# Patient Record
Sex: Female | Born: 1935
Health system: Southern US, Community
[De-identification: ages and names within clinical notes are randomized; demographics above are authoritative.]

## PROBLEM LIST (undated history)

## (undated) DIAGNOSIS — F419 Anxiety disorder, unspecified: Secondary | ICD-10-CM

## (undated) DIAGNOSIS — I639 Cerebral infarction, unspecified: Secondary | ICD-10-CM

## (undated) DIAGNOSIS — C50919 Malignant neoplasm of unspecified site of unspecified female breast: Secondary | ICD-10-CM

## (undated) DIAGNOSIS — I10 Essential (primary) hypertension: Secondary | ICD-10-CM

## (undated) DIAGNOSIS — I6529 Occlusion and stenosis of unspecified carotid artery: Secondary | ICD-10-CM

## (undated) DIAGNOSIS — I82409 Acute embolism and thrombosis of unspecified deep veins of unspecified lower extremity: Secondary | ICD-10-CM

## (undated) DIAGNOSIS — E785 Hyperlipidemia, unspecified: Secondary | ICD-10-CM

## (undated) DIAGNOSIS — I454 Nonspecific intraventricular block: Secondary | ICD-10-CM

## (undated) DIAGNOSIS — I5030 Unspecified diastolic (congestive) heart failure: Secondary | ICD-10-CM

## (undated) DIAGNOSIS — R413 Other amnesia: Secondary | ICD-10-CM

## (undated) DIAGNOSIS — Z90710 Acquired absence of both cervix and uterus: Secondary | ICD-10-CM

## (undated) DIAGNOSIS — E039 Hypothyroidism, unspecified: Secondary | ICD-10-CM

## (undated) HISTORY — DX: Cerebral infarction, unspecified: I63.9

## (undated) HISTORY — PX: PACEMAKER INSERTION: SHX728

## (undated) HISTORY — DX: Anxiety disorder, unspecified: F41.9

## (undated) HISTORY — DX: Hyperlipidemia, unspecified: E78.5

## (undated) HISTORY — DX: Hypothyroidism, unspecified: E03.9

## (undated) HISTORY — DX: Occlusion and stenosis of unspecified carotid artery: I65.29

## (undated) HISTORY — PX: CARPAL TUNNEL RELEASE: SHX101

## (undated) HISTORY — PX: BREAST SURGERY: SHX581

## (undated) HISTORY — DX: Nonspecific intraventricular block: I45.4

## (undated) HISTORY — DX: Unspecified diastolic (congestive) heart failure: I50.30

## (undated) HISTORY — DX: Other amnesia: R41.3

## (undated) HISTORY — DX: Acquired absence of both cervix and uterus: Z90.710

## (undated) HISTORY — DX: Acute embolism and thrombosis of unspecified deep veins of unspecified lower extremity: I82.409

## (undated) HISTORY — PX: CAROTID ENDARTERECTOMY: SUR193

## (undated) HISTORY — DX: Essential (primary) hypertension: I10

## (undated) HISTORY — PX: ABDOMINAL HYSTERECTOMY: SHX81

## (undated) HISTORY — PX: OTHER SURGICAL HISTORY: SHX169

## (undated) HISTORY — DX: Malignant neoplasm of unspecified site of unspecified female breast: C50.919

## (undated) HISTORY — PX: MASTECTOMY: SHX3

---

## 1998-11-02 ENCOUNTER — Ambulatory Visit (HOSPITAL_COMMUNITY): Admission: RE | Admit: 1998-11-02 | Discharge: 1998-11-02 | Payer: Self-pay | Admitting: Specialist

## 1999-05-25 ENCOUNTER — Encounter (INDEPENDENT_AMBULATORY_CARE_PROVIDER_SITE_OTHER): Payer: Self-pay | Admitting: Specialist

## 1999-05-25 ENCOUNTER — Observation Stay (HOSPITAL_COMMUNITY): Admission: RE | Admit: 1999-05-25 | Discharge: 1999-05-26 | Payer: Self-pay | Admitting: *Deleted

## 1999-05-28 ENCOUNTER — Emergency Department (HOSPITAL_COMMUNITY): Admission: EM | Admit: 1999-05-28 | Discharge: 1999-05-29 | Payer: Self-pay | Admitting: Emergency Medicine

## 1999-06-01 ENCOUNTER — Emergency Department (HOSPITAL_COMMUNITY): Admission: EM | Admit: 1999-06-01 | Discharge: 1999-06-01 | Payer: Self-pay | Admitting: Internal Medicine

## 2000-08-10 ENCOUNTER — Encounter: Payer: Self-pay | Admitting: Orthopedic Surgery

## 2000-08-17 ENCOUNTER — Ambulatory Visit (HOSPITAL_COMMUNITY): Admission: RE | Admit: 2000-08-17 | Discharge: 2000-08-17 | Payer: Self-pay | Admitting: Orthopedic Surgery

## 2000-09-25 ENCOUNTER — Ambulatory Visit (HOSPITAL_COMMUNITY): Admission: RE | Admit: 2000-09-25 | Discharge: 2000-09-25 | Payer: Self-pay | Admitting: Orthopedic Surgery

## 2003-05-06 ENCOUNTER — Encounter: Admission: RE | Admit: 2003-05-06 | Discharge: 2003-06-11 | Payer: Self-pay | Admitting: *Deleted

## 2005-11-07 HISTORY — PX: CARDIAC CATHETERIZATION: SHX172

## 2006-01-20 ENCOUNTER — Ambulatory Visit (HOSPITAL_COMMUNITY): Admission: RE | Admit: 2006-01-20 | Discharge: 2006-01-21 | Payer: Self-pay | Admitting: Cardiology

## 2006-01-26 ENCOUNTER — Ambulatory Visit (HOSPITAL_COMMUNITY): Admission: RE | Admit: 2006-01-26 | Discharge: 2006-01-26 | Payer: Self-pay | Admitting: Family Medicine

## 2006-01-26 ENCOUNTER — Encounter (INDEPENDENT_AMBULATORY_CARE_PROVIDER_SITE_OTHER): Payer: Self-pay | Admitting: *Deleted

## 2007-12-18 ENCOUNTER — Ambulatory Visit: Payer: Self-pay | Admitting: *Deleted

## 2007-12-18 ENCOUNTER — Encounter: Payer: Self-pay | Admitting: Cardiology

## 2009-06-01 ENCOUNTER — Inpatient Hospital Stay (HOSPITAL_COMMUNITY): Admission: EM | Admit: 2009-06-01 | Discharge: 2009-06-05 | Payer: Self-pay | Admitting: Emergency Medicine

## 2009-06-02 ENCOUNTER — Encounter (INDEPENDENT_AMBULATORY_CARE_PROVIDER_SITE_OTHER): Payer: Self-pay | Admitting: Internal Medicine

## 2009-12-25 ENCOUNTER — Ambulatory Visit: Payer: Self-pay | Admitting: Vascular Surgery

## 2010-02-04 ENCOUNTER — Encounter: Admission: RE | Admit: 2010-02-04 | Discharge: 2010-02-04 | Payer: Self-pay | Admitting: Cardiology

## 2010-07-13 ENCOUNTER — Ambulatory Visit: Payer: Self-pay | Admitting: Cardiology

## 2010-07-24 ENCOUNTER — Encounter: Payer: Self-pay | Admitting: Internal Medicine

## 2010-07-28 ENCOUNTER — Ambulatory Visit: Payer: Self-pay | Admitting: Cardiology

## 2010-10-28 ENCOUNTER — Ambulatory Visit: Payer: Self-pay | Admitting: Internal Medicine

## 2010-11-16 ENCOUNTER — Encounter (INDEPENDENT_AMBULATORY_CARE_PROVIDER_SITE_OTHER): Payer: Self-pay | Admitting: *Deleted

## 2010-12-07 NOTE — Miscellaneous (Signed)
Summary: Device preload  Clinical Lists Changes  Observations: Added new observation of PPM INDICATN: CHB (07/24/2010 11:18) Added new observation of MAGNET RTE: BOL 85 ERI 65 (07/24/2010 11:18) Added new observation of PPMLEADSTAT2: active (07/24/2010 11:18) Added new observation of PPMLEADSER2: 161096  (07/24/2010 11:18) Added new observation of PPMLEADMOD2: 4470  (07/24/2010 11:18) Added new observation of PPMLEADDOI2: 01/20/2006  (07/24/2010 11:18) Added new observation of PPMLEADLOC2: RV  (07/24/2010 11:18) Added new observation of PPMLEADSTAT1: active  (07/24/2010 11:18) Added new observation of PPMLEADSER1: 045409  (07/24/2010 11:18) Added new observation of PPMLEADMOD1: 4469  (07/24/2010 11:18) Added new observation of PPMLEADDOI1: 01/20/2006  (07/24/2010 11:18) Added new observation of PPMLEADLOC1: RA  (07/24/2010 11:18) Added new observation of PPM IMP MD: Duffy Rhody Tennant,MD  (07/24/2010 11:18) Added new observation of PPM DOI: 01/20/2006  (07/24/2010 11:18) Added new observation of PPM SERL#: [id number]  (07/24/2010 11:18) Added new observation of PPM MODL#: [id number]  (07/24/2010 95:62) Added new observation of PACEMAKERMFG: [employer]tronic  (07/24/2010 11:18) Added new observation of PPM REFER MD: Duffy Rhody Tennant,MD  (07/24/2010 11:18) Added new observation of PACEMAKER MD: Lewayne Bunting, MD  (07/24/2010 11:18)      PPM Specifications Following MD:  Lewayne Bunting, MD     Referring MD:  Rolla Plate PPM Vendor:  Medtronic     PPM Model Number:  [id number]     PPM Serial Number:  [id number] PPM DOI:  01/20/2006     PPM Implanting MD:  Rolla Plate  Lead 1    Location: RA     DOI: 01/20/2006     Model #: [id number]     Serial #: [id number]     Status: active Lead 2    Location: RV     DOI: 01/20/2006     Model #: [id number]     Serial #: [id number]     Status: active  Magnet Response Rate:  BOL 85 ERI 65  Indications:  CHB

## 2010-12-09 NOTE — Letter (Signed)
Summary: Remote Device Check  [employer], Main Office  [address]   [place], [place] [address]   Phone: [phone]  Fax: [phone]     November 16, 2010 MRN: [id number]   [facility] [address] [place], [place]  [address]   Dear Ms. Defenbaugh,   Your remote transmission was recieved and reviewed by your physician.  All diagnostics were within normal limits for you.  __X____Your next office visit is scheduled for:   01-25-11 @ 1110 am with Dr Ladona Ridgel.    Sincerely,  Vella Kohler

## 2010-12-09 NOTE — Cardiovascular Report (Signed)
Summary: Office Visit Remote   Office Visit Remote   Imported By: Roderic Ovens 11/26/2010 10:44:16  _____________________________________________________________________  External Attachment:    Type:   Image     Comment:   External Document

## 2011-01-11 ENCOUNTER — Ambulatory Visit: Payer: Self-pay | Admitting: Cardiology

## 2011-01-25 ENCOUNTER — Ambulatory Visit (INDEPENDENT_AMBULATORY_CARE_PROVIDER_SITE_OTHER): Payer: Medicare Other | Admitting: Internal Medicine

## 2011-01-25 ENCOUNTER — Encounter: Payer: Self-pay | Admitting: Internal Medicine

## 2011-01-25 DIAGNOSIS — Z950 Presence of cardiac pacemaker: Secondary | ICD-10-CM | POA: Insufficient documentation

## 2011-01-25 DIAGNOSIS — I442 Atrioventricular block, complete: Secondary | ICD-10-CM

## 2011-01-25 DIAGNOSIS — Z8673 Personal history of transient ischemic attack (TIA), and cerebral infarction without residual deficits: Secondary | ICD-10-CM

## 2011-01-25 DIAGNOSIS — I10 Essential (primary) hypertension: Secondary | ICD-10-CM

## 2011-01-25 NOTE — Assessment & Plan Note (Signed)
Her blood pressure is well controlled. She will continue her current meds and maintain a low sodium diet. 

## 2011-01-25 NOTE — Assessment & Plan Note (Signed)
Her device is working normally. Will recheck in several months. 

## 2011-01-25 NOTE — Assessment & Plan Note (Signed)
She has not had any additional symptoms. Will follow.

## 2011-01-25 NOTE — Progress Notes (Signed)
HPI Erin [name] is referred today for PPM followup. She is a pleasant 77 yo woman with a h/o HTN, symptomatic bradycardia, s/p PPM, remote tobacco abuse and peripheral edema for which she has been on TED hose. She denies c/p, sob, or syncope. No recent TIA symptoms.    Allergies  Allergen Reactions  . Toprol Xl (Metoprolol Succinate)      Current Outpatient Prescriptions  Medication Sig Dispense Refill  . aspirin 325 MG tablet Take 325 mg by mouth daily.        . Calcium Carb-Cholecalciferol (CALCIUM 1000 + D PO) Take by mouth.        . calcium carbonate (TUMS - DOSED IN MG ELEMENTAL CALCIUM) 500 MG chewable tablet Chew 2 tablets by mouth daily.        . chlordiazePOXIDE (LIBRIUM) 10 MG capsule Take 10 mg by mouth daily.        . Coenzyme Q10 (CO Q 10 PO) Take by mouth daily.        Marland Kitchen CRANBERRY FRUIT PO Take by mouth daily. 4 tablets daily       . Fish Oil OIL by Does not apply route daily.        . furosemide (LASIX) 20 MG tablet Take 20 mg by mouth daily.        Chilton Si Tea, Camillia sinensis, 250 MG CAPS Take by mouth daily. 4 tablets daily       . levothyroxine (SYNTHROID) 75 MCG tablet Take 75 mcg by mouth daily.        . Misc Natural Products (OSTEO BI-FLEX JOINT SHIELD PO) Take by mouth daily.        . Multiple Vitamin (MULTIVITAMIN PO) Take by mouth daily.        . simvastatin (ZOCOR) 40 MG tablet Take 40 mg by mouth at bedtime.           No past medical history on file.  ROS:   All systems reviewed and negative except as noted in the HPI.   No past surgical history on file.   No family history on file.   History   Social History  . Marital Status: Widowed    Spouse Name: N/A    Number of Children: N/A  . Years of Education: N/A   Occupational History  . Not on file.   Social History Main Topics  . Smoking status: Current Everyday Smoker -- 1.0 packs/day    Types: Cigarettes  . Smokeless tobacco: Former Neurosurgeon    Quit date: 11/07/1978  . Alcohol Use: No    . Drug Use: No  . Sexually Active: Not on file   Other Topics Concern  . Not on file   Social History Narrative  . No narrative on file     BP 130/64  Pulse 67  Ht 4\' 9"  (1.448 m)  Wt 96 lb 1.9 oz (43.6 kg)  BMI 20.80 kg/m2  Physical Exam:  Well appearing elderly woman,  NAD HEENT: Unremarkable Neck:  No JVD, no thyromegally Lymphatics:  No adenopathy Back:  No CVA tenderness Lungs:  Clear. Well healed PPM incision. HEART:  Regular rate rhythm, no murmurs, no rubs, no clicks Abd:  Flat, positive bowel sounds, no organomegally, no rebound, no guarding Ext:  2 plus pulses, no edema, no cyanosis, no clubbing Skin:  No rashes no nodules Neuro:  CN II through XII intact, motor grossly intact  DEVICE  Normal device function.  See PaceArt for details.  Assess/Plan:

## 2011-01-25 NOTE — Patient Instructions (Signed)
Your physician wants you to follow-up in 12 months. You will receive a reminder letter in the mail two months in advance. If you don't receive a letter, please call our office to schedule the follow-up appointment. Your physician recommends that you continue on your current medications as directed. Please refer to the Current Medication list given to you today.  Remote monitoring is used to monitor your Pacemaker or ICD from home. This monitoring reduces the number of office visits required to check your device to one time per year. It allows us to keep an eye on the functioning of your device to ensure it is working properly. You are scheduled for a device check from home on 04/28/11. You may send your transmission at any time that day. If you have a wireless device, the transmission will be sent automatically. After your physician reviews your transmission, you will receive a postcard with your next transmission date.  

## 2011-02-13 LAB — PROTIME-INR
INR: 0.9 (ref 0.00–1.49)
Prothrombin Time: 12.3 seconds (ref 11.6–15.2)

## 2011-02-13 LAB — CBC
MCV: 92.3 fL (ref 78.0–100.0)
Platelets: 269 10*3/uL (ref 150–400)
WBC: 4.3 10*3/uL (ref 4.0–10.5)

## 2011-02-13 LAB — POCT I-STAT, CHEM 8
Calcium, Ion: 1.07 mmol/L — ABNORMAL LOW (ref 1.12–1.32)
HCT: 42 % (ref 36.0–46.0)
Hemoglobin: 14.3 g/dL (ref 12.0–15.0)
TCO2: 25 mmol/L (ref 0–100)

## 2011-02-13 LAB — COMPREHENSIVE METABOLIC PANEL
CO2: 27 mEq/L (ref 19–32)
Calcium: 8.9 mg/dL (ref 8.4–10.5)
Creatinine, Ser: 0.59 mg/dL (ref 0.4–1.2)
GFR calc non Af Amer: >60 mL/min (ref >60)
Glucose, Bld: 104 mg/dL — ABNORMAL HIGH (ref 70–99)

## 2011-02-13 LAB — BASIC METABOLIC PANEL
Chloride: 100 mEq/L (ref 96–112)
Creatinine, Ser: 0.74 mg/dL (ref 0.4–1.2)
GFR calc Af Amer: >60 mL/min (ref >60)
GFR calc non Af Amer: >60 mL/min (ref >60)
Potassium: 3.6 mEq/L (ref 3.5–5.1)

## 2011-02-13 LAB — LIPID PANEL
Total CHOL/HDL Ratio: 4.5 RATIO
Triglycerides: 66 mg/dL (ref <150)
VLDL: 13 mg/dL (ref 0–40)

## 2011-02-13 LAB — DIFFERENTIAL
Basophils Relative: 1 % (ref 0–1)
Eosinophils Absolute: 0.2 10*3/uL (ref 0.0–0.7)
Lymphs Abs: 1.8 10*3/uL (ref 0.7–4.0)
Neutrophils Relative %: 41 % — ABNORMAL LOW (ref 43–77)

## 2011-02-13 LAB — GLUCOSE, CAPILLARY: Glucose-Capillary: 107 mg/dL — ABNORMAL HIGH (ref 70–99)

## 2011-02-13 LAB — HEMOGLOBIN A1C: Hgb A1c MFr Bld: 5.7 % (ref 4.6–6.1)

## 2011-02-13 LAB — CK TOTAL AND CKMB (NOT AT ARMC): Relative Index: INVALID (ref 0.0–2.5)

## 2011-02-13 LAB — APTT: aPTT: 25 seconds (ref 24–37)

## 2011-02-13 LAB — HOMOCYSTEINE: Homocysteine: 5.4 umol/L (ref 4.0–15.4)

## 2011-02-13 LAB — TROPONIN I: Troponin I: 0.01 ng/mL (ref 0.00–0.06)

## 2011-03-01 ENCOUNTER — Other Ambulatory Visit: Payer: Self-pay | Admitting: *Deleted

## 2011-03-01 DIAGNOSIS — R609 Edema, unspecified: Secondary | ICD-10-CM

## 2011-03-01 MED ORDER — FUROSEMIDE 20 MG PO TABS: 20.0000 mg | ORAL_TABLET | Freq: Every day | ORAL | Status: DC

## 2011-03-18 ENCOUNTER — Encounter: Payer: Self-pay | Admitting: *Deleted

## 2011-03-22 NOTE — H&P (Signed)
NAMESHAGUN, WORDELL              ACCOUNT NO.:  [id number]   MEDICAL RECORD NO.:  [id number]          PATIENT TYPE:  EMS   LOCATION:  MAJO                         FACILITY:  [facility]   PHYSICIAN:  Hollice Espy, M.D.DATE OF BIRTH:  02-Feb-1920   DATE OF ADMISSION:  06/01/2009  DATE OF DISCHARGE:                              HISTORY & PHYSICAL   PRIMARY CARE PHYSICIAN:  Vikki Ports, M.D.   CARDIOLOGIST:  Colleen Can. Deborah Chalk, M.D.   CHIEF COMPLAINT:  Left arm weakness.   HISTORY OF PRESENT ILLNESS:  The patient is a 77 year old white female  with past medical history of hypertension, hyperlipidemia and history of  bundle branch block, status post pacemaker, who today is still having  problems with left arm heaviness and weakness and unsteady gait.  She  said when she tried to stand, she was unsteady on her feet.  She denied  any vertigo-like symptoms, and she also said that her left arm felt very  heavy and clumsy.  She came into the emergency room for further  evaluation.  In the emergency room, she had a CT scan of her head done  which showed no evidence of any acute infarct or hemorrhage.  She had,  based on previous studies, some mild plaque in her bilateral internal  carotid arteries, based on an old study but no new studies have been  done in the last 3 years.  The rest of her labs were essentially  unremarkable, noting a paced rhythm on her EKG and blood work, otherwise  looking stable.  Patient was then brought in for a further evaluation of  a possible stroke workup.   When I saw the patient, she was doing well.  She is still complaining of  some left arm heaviness.  She also complained that when she tries to  stand up and walk, she gets very unsteady.  She also complained of a  minimal headache, no vision changes, no dysphagia, no chest pain,  palpitations, shortness of breath, wheeze, cough.  No abdominal pain.  No hematuria, dysuria, constipation, diarrhea.  Her  review of systems  otherwise than described above is otherwise negative.   PAST MEDICAL HISTORY:  Includes a history of some carotid artery plaque  but no significant stenosis,  history of full bundle branch block,  status post pacer, hypertension, hypothyroidism, breast cancer, status  post mastectomy, cataracts, hyperlipidemia, anxiety, DVT and some  diastolic CHF.   MEDICATIONS:  The patient is on Norvasc 2.5, aspirin 81, Librium 10,  Synthroid 88 mcg, multivitamin and Metamucil.   She has allergies to LISINOPRIL as well as to METOPROLOL, unclear with  kind of allergy this is.   She denies any tobacco, alcohol or drug use.   FAMILY HISTORY:  Noncontributory.   PHYSICAL EXAMINATION:  VITALS ON ADMISSION:  Temperature 97, blood  pressure 162/57, respirations 18, heart rate 64, O2 sat 93% on room air.  GENERAL:  She is alert and x3 in no apparent distress.  HEENT:  Normocephalic atraumatic.  Mucous membranes are moist.  She had  no carotid bruits.  HEART:  Regular rate and rhythm, paced rhythm.  LUNGS:  Clear to auscultation bilaterally.  ABDOMEN:  Soft, nontender with positive bowel sounds.  EXTREMITIES:  No clubbing, cyanosis or edema.  NEUROLOGIC:  Cranial nerves II-XII are intact.  Her actual strength when  distracted, her grip strength appears to be equal. She had some fine  finger-to-nose dysmetria on her left upper extremity, which is new, but  her lower extremities appear to be equal strength and no evidence of any  Babinski.   LAB WORK:  INR 0.9.  Sodium 131, potassium 2.9, chloride 95, bicarb 25,  BUN 4, creatinine 0.8, glucose 101, calcium is 1.07.  White count 4.3, H  and H 14 and 42.  MCV is normal.  Platelet count 269, INR 0.9.   EKG again shows a paced rhythm.   Head CT shows notes any acute infarct or hemorrhage.   ASSESSMENT/PLAN:  1. Left arm weakness with unsteady gait:  It does not look like she      has orthostatic hypotension.  No signs of an  infection.  This      possibly could be a cerebrovascular accident.  Will check Dopplers      and an echo and other baseline labs.  We are not able to check an      MRI because of her pacemaker.  May need to check if her symptoms      persist a follow-up CT in 72 hours.  2. History of bundle branch block, status post pacemaker, currently      stable.  3. History of congestive heart failure:  Currently does not look to be      in any kind of volume overload at all.  4. Hypothyroidism:  Continue Synthroid.  Will go ahead and check a TSH      on this patient.  Hollice Espy, M.D.  Electronically Signed     SKK/MEDQ  D:  06/01/2009  T:  06/01/2009  Job:  [id number]   cc:   Vikki Ports, M.D.  Colleen Can. Deborah Chalk, M.D.

## 2011-03-22 NOTE — Procedures (Signed)
CAROTID DUPLEX EXAM   INDICATION:  Syncope.  Patient having 3-4 syncopal episodes weekly for  the past month.  Patient noticed onset after changing medications.   HISTORY:  Diabetes:  Borderline.  Cardiac:  Pacemaker in 03/07.  Hypertension:  Yes.  Smoking:  No.  Previous Surgery:  Cardiac.  CV History:  Amaurosis Fugax No, Paresthesias No, Hemiparesis No                                       RIGHT             LEFT  Brachial systolic pressure:         124               128  Brachial Doppler waveforms:         WNL               WNL  Vertebral direction of flow:        Antegrade         Antegrade  DUPLEX VELOCITIES (cm/sec)  CCA peak systolic                   62                78  ECA peak systolic                   117               93  ICA peak systolic                   52                70  ICA end diastolic                   12                11  PLAQUE MORPHOLOGY:                  Mixed, calcific   Calcific with  shadowing  PLAQUE AMOUNT:                      Mild              Mild-moderate  PLAQUE LOCATION:                    ICA               Bifurcation, ICA   IMPRESSION:  1. Bilateral 1-39% internal carotid artery stenoses.  2. Patent external carotid arteries.  3. Bilateral antegrade flow in vertebral arteries.   ___________________________________________  P. Liliane Bade, M.D.   PB/MEDQ  D:  12/18/2007  T:  12/19/2007  Job:  [id number]

## 2011-03-22 NOTE — Procedures (Signed)
DUPLEX DEEP VENOUS EXAM - LOWER EXTREMITY   INDICATION:  Edema, redness to left leg.   HISTORY:  Edema:  Yes.  Trauma/Surgery:  No.  Pain:  Yes.  PE:  No.  Previous DVT:  No.  Anticoagulants:  No.  Other:  No.   DUPLEX EXAM:                CFV   SFV   PopV  PTV         GSV                R  L  R  L  R  L  R   L       R  L  Thrombosis    0  0     0     0      Not visualized          0  Spontaneous   +  +     +     +      Not visualized          +  Phasic        +  +     +     +      Not visualized          +  Augmentation  +  +     +     +      Not visualized          +  Compressible  +  +     +     +      Not visualized          +  Competent     +  +     +     +      Not visualized          +   Legend:  + - yes  o - no  p - partial  D - decreased   IMPRESSION:  There does not appear to be any deep vein thrombus noted in  the left leg.  The left posterior tibial veins cannot be visualized due  to edema.     _____________________________  Larina Earthly, M.D.   CB/MEDQ  D:  12/25/2009  T:  12/26/2009  Job:  [id number]

## 2011-03-22 NOTE — Consult Note (Signed)
Erin [name], Erin [name]              ACCOUNT NO.:  [id number]   MEDICAL RECORD NO.:  [id number]          PATIENT TYPE:  INP   LOCATION:  3022                         FACILITY:  [facility]   PHYSICIAN:  Casimiro Needle L. Reynolds, M.D.DATE OF BIRTH:  [birth year]   DATE OF CONSULTATION:  06/01/2009  DATE OF DISCHARGE:                                 CONSULTATION   REQUESTING PHYSICIAN:  [facility].   REASON FOR EVALUATION:  Left-sided weakness.   HISTORY OF PRESENT ILLNESS:  This is the initial inpatient consultation  evaluation of this 77 year old woman with a past medical history which  includes hypertension and hyperlipidemia and pacemaker placement.  The  patient says that she awoke this morning feeling well, but then noticed  around 12:30 that she had some weakness in the left side, and some  difficulty walking around.  She was brought to the emergency department,  where she is noted to have weakness on the left side.  Initially, Code  Stroke was called, and her symptoms improved considerably, and Code  Stroke was cancelled.  However, her symptoms did not resolve fully, and  have continued with some little bit of waxing and waning course.  She  denies any associated headache, visual symptoms, dysarthria, dysphasia.  She denies any previous history of stroke symptoms.   PAST HISTORY:  No previous history of stroke.  She has hypertension and  high cholesterol, and is on medications for hypertension.  She also had  hypothyroidism on replacement.  She has remote history of breast cancer.  She has anxiety, on Librium.   MEDICATIONS:  1. Amlodipine 2.5 mg daily.  2. Librium 10 mg daily.  3. Levothyroxine 88 mcg daily.  4. Aspirin 81 mg daily.  5. Multivitamin.  6. Metamucil.   FAMILY/SOCIAL/REVIEW OF SYSTEMS:  Per admission H and P which is  reviewed.   PHYSICAL EXAMINATION:  VITAL SIGNS:  Temperature 97, blood pressure  162/57, pulse 64, respirations 18.  GENERAL:   This is a healthy-appearing woman supine in the hospital bed  in no distress.  HEENT:  Head, Cranium is normocephalic and atraumatic.  Oropharynx  benign.  NECK:  Supple without carotid or supraclavicular bruits.  HEART:  Regular rate and rhythm without murmurs.  NEUROLOGIC:  Mental status:  She is awake and alert.  She is fully  oriented to time, place, and person.  Speech is fluent and not  dysarthric.  Recent and remote memory are intact.  Attention span,  concentration, and fund of knowledge are all appropriate.  Cranial  nerves:  Pupils are equal and briskly reactive.  Extraocular movements  are full without nystagmus.  Visual fields are full to confrontation.  Hearing is intact to conversational speech.  Facial sensation is intact  to pinprick.  Face, tongue, and palate move normally and symmetrically.  Motor:  Normal bulk and tone.  Normal strength in all tested extremity  muscles.  Sensation:  Diminished pinprick sensation in the left arm,  otherwise intact to pinprick in all extremities.  Coordination:  Finger-  nose and heel-shin are performed well in the right, but  there is notable  ataxia on the left.  Reflexes are 2+ and symmetric.  Toes is up on the  left, down on the right.  Gait is deferred.   LABORATORY REVIEW:  Labs per daily notes.  CT of the head is personally  reviewed, and the study demonstrates atrophy and white matter disease  without acute finding.   IMPRESSION:  Acute left-sided hemisensory changes and hemiataxia, likely  due to a small subcortical right brain stroke.   RECOMMENDATIONS:  She needs stroke workup.  Because of her pacer, MRI is  contraindicated, so she will need a followup CT of the head along with  CT angiogram tomorrow. She will also need Doppler studies and stroke  labs, as well as a 2-D echo.  The Physical [facility] Occupational Therapy and  Rehab should be consulted.  Stroke Service to follow.      Michael L. Thad Ranger, M.D.  Electronically  Signed     Marolyn Hammock. Thad Ranger, M.D.  Electronically Signed    MLR/MEDQ  D:  06/01/2009  T:  06/02/2009  Job:  [id number]   cc:   Colleen Can. Deborah Chalk, M.D.

## 2011-03-22 NOTE — Discharge Summary (Signed)
Erin [name], [name]              ACCOUNT NO.:  [id number]   MEDICAL RECORD NO.:  [id number]          PATIENT TYPE:  INP   LOCATION:  3022                         FACILITY:  [facility]   PHYSICIAN:  Ramiro Harvest, MD    DATE OF BIRTH:  08-18-1920   DATE OF ADMISSION:  06/01/2009  DATE OF DISCHARGE:  06/05/2009                               DISCHARGE SUMMARY   DIAGNOSIS AT TIME OF DISCHARGE:  1. Right-sided CVA (cerebrovascular accident).  2. History of bundle branch block status post permanent pacemaker.  3. Hypertension currently on increased dose of Norvasc.  4. Hypothyroidism.  5. Dyslipidemia.   HISTORY OF PRESENT ILLNESS:  Erin [name] is an 77 year old white female  who was admitted on June 01, 2009.  Her chief complaint at time of  admission was left arm weakness.  Her past medical history was  significant for hypertension, hyperlipidemia and history of bundle  branch block which has required permanent pacemaker placement.  On the  day of admission she was still having problems with left arm heaviness  and weakness as well as unsteady gait.  She felt as though her left arm  was very heavy and clumsy.  We were asked to admit the patient for  further evaluation and treatment for a stroke workup.   COURSE OF HOSPITALIZATION:  Right-sided CVA.  The patient came to the  [facility] emergency department where she underwent a CT of the head  without contrast.  This showed no CT evidence of acute infarction.  It  also noted no evidence of acute intracranial hemorrhage.  Due to the  patient's permanent pacemaker she was unable to undergo MRI.  A  neurology consult was requested and the patient was seen in consultation  by Dr. Kelli Hope.  It was recommended that the patient undergo CT  angio of the head and neck.  CT angio noted narrowing of the proximal  left subclavian artery estimated at 40-50% and this was felt to be a  contributing factor in the patient's CVA.   Recommendations are at this  time risk factor modification.  We will continue to treat the patient's  lipids.  Her aspirin has been increased from 81-325 mg p.o. daily.  She  continues with left-sided hemiparesis and will be transferred this  afternoon to the skilled nursing facility where she will undergo further  rehabilitation prior to discharge to home.   MEDICATIONS AT TIME OF DISCHARGE:  1. Amlodipine 5 mg p.o. daily.  This is a new dose for the patient.  2. Librium 10 mg p.o. daily.  3. Synthroid 88 mcg p.o. daily.  4. Aspirin 325 mg p.o. daily.  This is a new dose for the patient      since prior to admission.  5. Multivitamin 1 tablet p.o. daily.  6. Metamucil 1 packet p.o. daily.   PHYSICAL EXAM:  VITAL SIGNS:  BP 131/59, heart rate 63, respiratory rate  20, temperature 98.5, O2 sat 97% on room air.  GENERAL:  An elderly white female who is awake and alert and in no acute  distress.  CARDIOVASCULAR:  S1-S2, regular rate and rhythm is noted.  RESPIRATORY:  Breath sounds clear to auscultation bilaterally without  wheezes, rales or rhonchi.  ABDOMEN:  Soft, nontender, nondistended.  Positive bowel sounds are  noted.  PSYCHIATRIC:  She is A and O x3.  She is calm and pleasant.  NEURO:  Left upper extremity strength is 4-5/5 plus, left lower  extremity strength is 4-5/5+.  Right upper and right lower extremity  strength is 5/5.  Her speech is clear.  She has positive facial  symmetry.   PERTINENT LABORATORY AT TIME OF DISCHARGE:  Homocysteine 5.4, total  cholesterol 223, HDL 50, LDL 160.  TSH 1.143, BUN 4, creatinine 0.74,  A1c 5.7.  Cardiac enzymes are negative.  AST and ALT normal.   DISPOSITION:  The patient will be transferred to [facility] skilled  nursing facility.   FOLLOWUP:  The patient will need followup with her primary care  Erin [name], Dr. Theresia Lo, in 1-2 weeks.  She will also need followup with  Dr. Delia Heady of neurology in approximately 2 months.    DIET:  At time of discharge the patient will be maintained on a low-  sodium/heart-healthy diet.   ACTIVITY:  She is to increase activity slowly and walk with assistance  and utilize a walker.   CONDITION AT TIME OF DISCHARGE:  Is stable and improved.   Greater than 30 minutes was spent on discharge planning.      Sandford Craze, NP      Ramiro Harvest, MD  Electronically Signed    MO/MEDQ  D:  06/05/2009  T:  06/05/2009  Job:  [id number]   cc:   Colleen Can. Deborah Chalk, M.D.  Vikki Ports, M.D.

## 2011-03-25 ENCOUNTER — Telehealth: Payer: Self-pay | Admitting: Cardiology

## 2011-03-25 NOTE — Discharge Summary (Signed)
Erin [name], Erin [name]              ACCOUNT NO.:  [id number]   MEDICAL RECORD NO.:  [id number]          PATIENT TYPE:  OIB   LOCATION:  6529                         FACILITY:  [facility]   PHYSICIAN:  Colleen Can. Deborah Chalk, M.D.DATE OF BIRTH:  10/30/1920   DATE OF ADMISSION:  01/20/2006  DATE OF DISCHARGE:  01/21/2006                                 DISCHARGE SUMMARY   PRIMARY DISCHARGE DIAGNOSIS:  Complete heart block with subsequent  implantation of a Medtronic Adaptor pulse generator, model ADDRO1, serial  #[id number].   SECONDARY DISCHARGE DIAGNOSES:  1.  A 10-day history of shortness of breath.  2.  Cardiomegaly.  3.  History of atypical chest pain with equivocal Cardiolite in March 2004.  4.  Known mild left ventricular hypertrophy.  5.  History of right bundle branch block with left anterior hemiblock.   HISTORY OF PRESENT ILLNESS:  The patient is a pleasant, elderly female who  presented to the office with a 10-day history of increasing shortness of  breath.  She had been seen earlier by primary care with these complaints.  She was noted to have a cardiomegaly on chest x-ray.  Her heart rate at that  time was in the 40s.  Her BNP was elevated at 411 and she was started on low-  dose diuretic therapy.  She presented to our office on March 16.  She was  noted to have complete heart block and she was subsequently admitted in  order to proceed on with dual-chamber pacemaker implantation.   Please see dictated history and physical for the patient's presentation and  profile.   LABORATORY DATA:  EKG showed right bundle branch block with left anterior  hemiblock and complete heart block.  Rate is in the 40s.  It appeared to be  a 2:1 configuration.  Other labs are per the chart.   HOSPITAL COURSE:  Patient was admitted.  We underwent pacemaker implantation  that following afternoon on January 20, 2006.  That procedure was tolerated  well without any known complications.  A Medtronic  Adaptor Pulse generator  model ADDRO1, serial [id number] was implanted.  The procedure was tolerated  well without any known complications.  Postprocedure, she was transferred to  6500 and the following morning she was deemed stable for discharge.  Rhythm  was satisfactory.  Pacemaker interrogation was satisfactory and followup  chest x-ray showed no acute abnormality.   DISCHARGE CONDITION:  Stable.   DISCHARGE MEDICINES:  1.  Vytorin 10/40.  2.  Lasix 20 mg a day.  3.  Librium as taken before.  4.  Synthroid 88 mcg a day.  5.  Use Tylenol for any discomfort.   Extensive written instructions were given regarding pacemaker care,  specifically not to raise the right arm above her head for the next two  weeks.  She was given Betadine swabs to paint the site twice a day over the  next three days.  Will plan on seeing her back in the office in 7-10 days  for wound check and certainly sooner if any problems arise.      Juanell Fairly  Lady Gary, N.P.      Colleen Can. Deborah Chalk, M.D.  Electronically Signed    LC/MEDQ  D:  01/23/2006  T:  01/24/2006  Job:  [id number]   cc:   Al Decant. Janey Greaser, MD  Fax: [phone]

## 2011-03-25 NOTE — Op Note (Signed)
[facility]  Patient:    Erin [name], Erin [name]                     MRN: [id number] Proc. Date: 08/17/00 Adm. Date:  [id number] Attending:  Marlowe Kays Page                           Operative Report  PREOPERATIVE DIAGNOSIS:  Bilateral carpal tunnel syndrome.  POSTOPERATIVE DIAGNOSIS:  Bilateral carpal tunnel syndrome.  OPERATION:  Decompression of median nerve, right wrist and hand.  SURGEON:  Illene Labrador. Aplington, M.D.  ASSISTANT:  Nurse.  ANESTHESIA:  IV regional.  PATHOLOGY AND INDICATION FOR PROCEDURE:  She fell, sustaining bilateral wrist fractures on August 7.  She has had severe numbness in both hands since, with EMG and nerve conduction studies performed on September 27 demonstrating severe bilateral carpal tunnel syndrome.  She is here today for release on the right with plans for release after the left after she is physically able to use her right hand.  DESCRIPTION OF PROCEDURE:  Satisfactory IV regional anesthesia.  Duraprep from mid-forearm to fingertips, was draped as a sterile field.  With the marking pen, I marked out a curved incision along the base of the thenar eminence, crossing obliquely to the flexor crease of the wrist and the distal forearm. The palmaris longus tendon beneath it.  A very swollen median nerve was identified.  Potential bleeders were coagulated with bipolar cautery.  I released skin, subcutaneous tissue, and very gritty fascial-type tissue in the midpalm.  In the midpalm, she had severe compression of the nerve with thinning and discoloration.  In the mid- and distal palm, I dissected out the neurovascular branches and continued to release fascia, but again the  main compression was in the midpalm.  Once the decompression had been completed, the wound was irrigated with sterile saline and the skin and subcutaneous tissue only closed with interrupted 4-0 nylon mattress sutures.  Betadine, Adaptic, dry sterile  dressing, and volar plaster splint were applied.  The tourniquet was released.  At the time of this dictation, she was on her way to recovery in satisfactory condition.  There were no complications. DD:  08/17/00 TD:  08/19/00 Job: [id number]

## 2011-03-25 NOTE — Op Note (Signed)
[place]  Patient:    Erin [name], Erin [name]                     MRN: [id number] Proc. Date: 09/25/00 Adm. Date:  [id number] Disc. Date: [id number] Attending:  Marlowe Kays Page                           Operative Report  PREOPERATIVE DIAGNOSIS:  Left carpal tunnel syndrome, status post Colles fracture.  POSTOPERATIVE DIAGNOSIS:  Left carpal tunnel syndrome, status post Colles fracture.  OPERATION PERFORMED:  Decompression of median nerve, left wrist and hand.  SURGEON:  Illene Labrador. Aplington, M.D.  ASSISTANT:  Nurse.  ANESTHESIA:  IV regional.  INDICATIONS FOR PROCEDURE:  She had satisfactory reduction of bilateral carpal Colles fractures, but had subsequent severe carpal tunnel syndrome documented by EMG and nerve conduction studies.  She has had a release on the right and is here for a release on the left because of significant pain and numbness.  DESCRIPTION OF PROCEDURE:  Satisfactory IV regional anesthesia, DuraPrep from midforearm to fingertips and was draped in a sterile field.  A curved incision was marked out along the base of the thenar eminence crossing obliquely over the flexor crease of the wrist in the distal forearm with a marking pen.  The median nerve was identified at the wrist.  ____________ It was quite swollen. Bipolar cautery was used to release skin, subcutaneous tissues and fascia going into the mid and distal palm.  The median nerve was gently dissected out with a small hemostat.  There was severe compression in the carpal canal. When the median nerve and all its branches had been released, I irrigated the wound with sterile saline and closed the skin and subcutaneous tissues only with interrupted 4-0 nylon mattress sutures.  Betadine, Adaptic, dry sterile dressing and volar plaster splint were applied.  The patient tolerated the procedure well and was taken to recovery in satisfactory condition with no known  complications. DD:  09/25/00 TD:  09/26/00 Job: [id number]

## 2011-03-25 NOTE — H&P (Signed)
[name], [name]              ACCOUNT NO.:  [id number]   MEDICAL RECORD NO.:  [id number]          PATIENT TYPE:  OIB   LOCATION:  6529                         FACILITY:  [facility]   PHYSICIAN:  Colleen Can. Deborah Chalk, M.D.DATE OF BIRTH:  [birth year]   DATE OF ADMISSION:  01/20/2006  DATE OF DISCHARGE:  01/21/2006                                HISTORY & PHYSICAL   HISTORY:  Erin [name] is admitted with approximately a 10-day history of  increasing congestive heart failure.  She was seen by Dr. Janey Greaser, on January 11, 2006, with increasing shortness of breath.  She was noted to have  cardiomegaly on chest x-ray.  At that point in time, her heart rate was in  the 40s.  A chest x-ray showed increased heart size on exam.  She had an  elevated BNP in the 411 and was felt to have congestive heart failure but  was noted to have a potassium, on January 06, 2006, of 6.1.  The repeat  potassium level was 4.6 on January 11, 2006.   1.  She has a history of atypical chest pain and an equivocal Cardiolite in      March 2004.  2.  She has had a 2D echocardiogram, in January 2005, which showed a normal      ejection fraction, mild left ventricular hypertrophy.  3.  She has had a right bundle branch block and left anterior hemiblock      noted in the past.  4.  She has had a stress Cardiolite study which is felt to have been      somewhat equivocal done in March 2004.   FAMILY HISTORY:  Her father died, age 68, of questionable causes.  Mother  died, age 6, of questionable causes.  One brother living at age 80.  They  were adopted.   PAST MEDICAL HISTORY:  1.  Hypertension.  2.  Hypothyroidism on replacement therapy.  3.  Hysterectomy.  4.  Bilateral mastectomies.  5.  Carpal tunnel surgery.   ALLERGIES:  1.  TETRACYCLINE.  2.  CODEINE.   CURRENT MEDICATIONS:  Vytorin 10/40, furosemide 20 mg a day, green tea  capsule, fish oil, calcium, Centrum, Osteobioflex taking three a day,  Librium 10 mg per  day, Synthroid 0.88 mcg per day.   REVIEW OF SYSTEMS:  She is quite rambling and easily confused.  She is  reasonably active in her yard.  She has had more shortness of breath but no  chest pain.  Otherwise review of systems is negative.  She has had  increasing pedal edema, in spite of the diuretic therapy.  She has had  increasing shortness of breath.  She has had no syncope.   PHYSICAL EXAMINATION:  VITAL SIGNS:  Today, her weight is 130.5, blood  pressure 160/60, heart rate is 40.  HEENT:  Negative.  LUNGS:  Show crackles scattered throughout.  HEART:  Shows bradycardia with a soft out flow murmur.  ABDOMEN:  Soft, nontender.  EXTREMITIES:  Show 1 to 2+ lower extremity edema.   EKG shows complete heart block.  There  is a right bundle branch block and a  left anterior hemiblock.  It is in a 2:1 configuration.   OVERALL IMPRESSION:  1.  Complete heart block.  2.  Congestive heart failure.  3.  History of bilateral mastectomies.  4.  Hyperlipidemia.  5.  History of previous hysterectomy.  6.  Hypothyroidism on replacement.   PLAN:  We will admit Erin [name] and plan on proceeding on with a dual  chamber pacemaker today.  The procedure risks and benefits have been  explained.  The risk including arrhythmia or infection have been explained  and the patient is willing to proceed.      Colleen Can. Deborah Chalk, M.D.  Electronically Signed     SNT/MEDQ  D:  01/20/2006  T:  01/22/2006  Job:  [id number]

## 2011-03-25 NOTE — Telephone Encounter (Signed)
GOT A LETTER AND WANTS SOMEONE TO EXPLAIN WHAT IT MEANS BY PRIMARY CARE DR.

## 2011-03-25 NOTE — Cardiovascular Report (Signed)
NAMETREENA, COSMAN              ACCOUNT NO.:  [id number]   MEDICAL RECORD NO.:  [id number]          PATIENT TYPE:  OIB   LOCATION:  6529                         FACILITY:  [facility]   PHYSICIAN:  Colleen Can. Deborah Chalk, M.D.DATE OF BIRTH:  [birth year]   DATE OF PROCEDURE:  01/20/2006  DATE OF DISCHARGE:  01/21/2006                              CARDIAC CATHETERIZATION   INDICATIONS FOR PROCEDURE:  Complete heart block.   PROCEDURE:  The right subclavicular area was prepped and draped. The area  was infiltrated with 1% Xylocaine. Subcutaneous pocket was made to the  prepectoral fascia. Using two punctures over the top of the first rib, the  guidewires were introduced into the subclavian vein. Using 7-[employer] [facility]  introducers, the atrial and ventricular leads were introduced. The  ventricular lead was a Guidant active fixation lead model 4470 52 cm lead,  serial number [id number]. The atrial lead was a Guidant model 4469 45 cm  lead, serial number [id number]. The leads were sutured in place. The wound was  flushed with kanamycin solution. The lead thresholds were as follows. The R-  waves were measured at 9.1 mV. Pacing impedance in the RV was 583 ohms with  an amplitude of 0.3 volts to capture 0.5 milliseconds pulse width with a  current 0.2 MA. P-waves were measured at 1.6 mV. The pacing impedance was  454 ohms in the right atrium. The amplitude of threshold in the right atrium  was 0.7 volts, 0.5 milliseconds pulse with a current with 2.0 MA.   The leads were connected to the Medtronic Adapta pulse generator. Model  ADDR01 serial number [id number]. The unit was sutured in place. The wound  was closed with 2-0 and subsequent 4-0 Vicryl. Steri-Strips were applied.  The patient tolerated the procedure well.      Colleen Can. Deborah Chalk, M.D.  Electronically Signed     SNT/MEDQ  D:  01/20/2006  T:  01/22/2006  Job:  [id number]

## 2011-03-25 NOTE — Telephone Encounter (Signed)
RN left message for pt that pt should f/u with Dr. Ladona Ridgel for pacemaker checks and that her primary care doctor could manage all of her other issues.  Pt told to call back with any concerns.

## 2011-04-07 ENCOUNTER — Telehealth: Payer: Self-pay | Admitting: Cardiology

## 2011-04-07 NOTE — Telephone Encounter (Signed)
Has a question about a wellness report she has received.  She wants to speak with RN-Kelly or Lawson Fiscal about the results.

## 2011-04-07 NOTE — Telephone Encounter (Signed)
Left message for pt to call RN back on pt's home number.

## 2011-04-28 ENCOUNTER — Ambulatory Visit (INDEPENDENT_AMBULATORY_CARE_PROVIDER_SITE_OTHER): Payer: Medicare Other | Admitting: *Deleted

## 2011-04-28 DIAGNOSIS — I442 Atrioventricular block, complete: Secondary | ICD-10-CM

## 2011-04-29 ENCOUNTER — Other Ambulatory Visit: Payer: Self-pay | Admitting: Internal Medicine

## 2011-05-05 ENCOUNTER — Telehealth: Payer: Self-pay | Admitting: Cardiology

## 2011-05-05 NOTE — Telephone Encounter (Signed)
Did u get, can u call pt.

## 2011-05-05 NOTE — Telephone Encounter (Signed)
Pt said she hasnt heard anything about pacemaker transmission she had at [place]? Please call

## 2011-05-05 NOTE — Telephone Encounter (Signed)
Spoke w/pt to let know transmission was received.  All was normal. Pt to receive letter in mail with next appt. Pt aware of appt.

## 2011-05-10 ENCOUNTER — Encounter: Payer: Self-pay | Admitting: *Deleted

## 2011-05-10 NOTE — Progress Notes (Signed)
Pacer remote check  

## 2011-08-04 ENCOUNTER — Ambulatory Visit (INDEPENDENT_AMBULATORY_CARE_PROVIDER_SITE_OTHER): Payer: Medicare Other | Admitting: *Deleted

## 2011-08-04 ENCOUNTER — Other Ambulatory Visit: Payer: Self-pay | Admitting: Internal Medicine

## 2011-08-04 ENCOUNTER — Encounter: Payer: Self-pay | Admitting: Internal Medicine

## 2011-08-04 DIAGNOSIS — I442 Atrioventricular block, complete: Secondary | ICD-10-CM

## 2011-08-07 LAB — REMOTE PACEMAKER DEVICE
AL AMPLITUDE: 2.8 mv
AL IMPEDENCE PM: 425 Ohm
AL THRESHOLD: 0.25 V
BATTERY VOLTAGE: 2.75 V
RV LEAD IMPEDENCE PM: 560 Ohm

## 2011-08-10 ENCOUNTER — Encounter: Payer: Self-pay | Admitting: *Deleted

## 2011-08-11 NOTE — Progress Notes (Signed)
Pacer remote check  

## 2011-08-31 ENCOUNTER — Other Ambulatory Visit: Payer: Self-pay | Admitting: Cardiology

## 2011-11-03 ENCOUNTER — Other Ambulatory Visit: Payer: Self-pay | Admitting: Internal Medicine

## 2011-11-03 ENCOUNTER — Encounter: Payer: Self-pay | Admitting: Internal Medicine

## 2011-11-03 ENCOUNTER — Ambulatory Visit (INDEPENDENT_AMBULATORY_CARE_PROVIDER_SITE_OTHER): Payer: Medicare Other | Admitting: *Deleted

## 2011-11-03 DIAGNOSIS — I442 Atrioventricular block, complete: Secondary | ICD-10-CM

## 2011-11-03 DIAGNOSIS — Z950 Presence of cardiac pacemaker: Secondary | ICD-10-CM

## 2011-11-04 LAB — REMOTE PACEMAKER DEVICE
AL THRESHOLD: 0.25 V
BATTERY VOLTAGE: 2.75 V

## 2011-11-10 NOTE — Progress Notes (Signed)
Remote pacer check  

## 2011-11-15 ENCOUNTER — Telehealth: Payer: Self-pay | Admitting: Internal Medicine

## 2011-11-15 NOTE — Telephone Encounter (Signed)
Spoke w/pt---pt not received letter with next appointment. Pt scheduled for 01-31-12 with GT.

## 2011-11-15 NOTE — Telephone Encounter (Signed)
New Msg: Pt calling wanting to speak with Gunnar Fusi or Belenda Cruise regarding pt most recent remote device check.   Please return pt call to discuss further.

## 2011-11-15 NOTE — Telephone Encounter (Signed)
lmom for pt to return call. °

## 2011-11-15 NOTE — Telephone Encounter (Signed)
FU Call: Pt returning call to Kristin. Please return pt call to discuss further.  

## 2011-11-24 ENCOUNTER — Encounter: Payer: Self-pay | Admitting: *Deleted

## 2012-01-31 ENCOUNTER — Ambulatory Visit (INDEPENDENT_AMBULATORY_CARE_PROVIDER_SITE_OTHER): Payer: Medicare Other | Admitting: Internal Medicine

## 2012-01-31 ENCOUNTER — Encounter: Payer: Self-pay | Admitting: Internal Medicine

## 2012-01-31 VITALS — BP 122/60 | HR 67 | Wt 97.1 lb

## 2012-01-31 DIAGNOSIS — I442 Atrioventricular block, complete: Secondary | ICD-10-CM

## 2012-01-31 DIAGNOSIS — I10 Essential (primary) hypertension: Secondary | ICD-10-CM

## 2012-01-31 DIAGNOSIS — Z950 Presence of cardiac pacemaker: Secondary | ICD-10-CM

## 2012-01-31 LAB — PACEMAKER DEVICE OBSERVATION
AL AMPLITUDE: 5.6 mv
AL THRESHOLD: 0.25 V
BAMS-0001: 175 {beats}/min
BATTERY VOLTAGE: 2.75 V

## 2012-01-31 NOTE — Progress Notes (Signed)
HPI Erin [name] returns today for followup. She had been previously a patient of Dr. Ronnald Nian. She has a history of symptomatic bradycardia status post permanent pacemaker insertion. She has a remote stroke. She denies chest pain or shortness of breath. No peripheral edema. Allergies  Allergen Reactions  . Toprol Xl (Metoprolol Succinate)      Current Outpatient Prescriptions  Medication Sig Dispense Refill  . aspirin 325 MG tablet Take 325 mg by mouth daily.      . Calcium Carb-Cholecalciferol (CALCIUM 1000 + D PO) Take 2 tablets by mouth daily.      . calcium carbonate (TUMS EX) 750 MG chewable tablet Chew 4 tablets by mouth daily.      . chlordiazePOXIDE (LIBRIUM) 10 MG capsule Take 10 mg by mouth daily.      . Coenzyme Q10 (COQ-10) 400 MG CAPS Take 1 tablet by mouth daily.      Marland Kitchen CRANBERRY FRUIT PO Take 2,000 mcg by mouth 4 (four) times daily.      . fish oil-omega-3 fatty acids 1000 MG capsule Take 2 g by mouth daily.      . furosemide (LASIX) 20 MG tablet Take 1 tablet (20 mg total) by mouth daily.  90 tablet  3  . Green Tea, Camillia sinensis, ([employer] GREEN TEA EXTRACT PO) Take by mouth 4 (four) times daily.      Marland Kitchen levothyroxine (SYNTHROID, LEVOTHROID) 75 MCG tablet Take 75 mcg by mouth daily.      . Misc Natural Products (OSTEO BI-FLEX ADV JOINT SHIELD PO) Take 1 tablet by mouth daily.      . Multiple Vitamins-Minerals (CENTRUM SPECIALIST HEART PO) Take 2 tablets by mouth daily.      . simvastatin (ZOCOR) 40 MG tablet TAKE 1 TABLET BY MOUTH DAILY  90 tablet  2     Past Medical History  Diagnosis Date  . Carotid artery plaque   . BBB (bundle branch block)   . HTN (hypertension)   . Hypothyroidism   . Breast cancer   . Hyperlipidemia   . DVT (deep venous thrombosis)   . Anxiety   . Diastolic CHF   . H/O: hysterectomy     ROS:   All systems reviewed and negative except as noted in the HPI.   Past Surgical History  Procedure Date  . Mastectomy   . Cataracts   .  Pacemaker insertion   . Carpal tunnel release   . Cardiac catheterization 2007     Family History  Problem Relation Age of Onset  . Unexplained death Mother 51  . Unexplained death Father 47     History   Social History  . Marital Status: Widowed    Spouse Name: N/A    Number of Children: N/A  . Years of Education: N/A   Occupational History  . Not on file.   Social History Main Topics  . Smoking status: Current Everyday Smoker -- 1.0 packs/day    Types: Cigarettes  . Smokeless tobacco: Former Neurosurgeon    Quit date: 11/07/1978  . Alcohol Use: No  . Drug Use: No  . Sexually Active: Not on file   Other Topics Concern  . Not on file   Social History Narrative  . No narrative on file     BP 122/60  Pulse 67  Wt 44.053 kg (97 lb 1.9 oz)  Physical Exam:  Well appearing elderly woman, NAD HEENT: Unremarkable Neck:  No JVD, no thyromegally Lymphatics:  No adenopathy Back:  No CVA tenderness Lungs:  Clear with no wheezes, rales, or rhonchi. Well-healed pacemaker incision. HEART:  Regular rate rhythm, no murmurs, no rubs, no clicks Abd:  soft, positive bowel sounds, no organomegally, no rebound, no guarding Ext:  2 plus pulses, no edema, no cyanosis, no clubbing Skin:  No rashes no nodules Neuro:  CN II through XII intact, motor grossly intact  DEVICE  Normal device function.  See PaceArt for details.   Assess/Plan:

## 2012-01-31 NOTE — Assessment & Plan Note (Signed)
Her pacemaker is working normally today. We'll plan to recheck in several months.

## 2012-01-31 NOTE — Assessment & Plan Note (Signed)
Her blood pressure is well controlled. She will continue her current medical therapy and I've asked the patient to maintain a low-sodium diet.

## 2012-01-31 NOTE — Patient Instructions (Signed)
Your physician wants you to follow-up in: 12 months with Dr Taylor You will receive a reminder letter in the mail two months in advance. If you don't receive a letter, please call our office to schedule the follow-up appointment.   Remote monitoring is used to monitor your Pacemaker of ICD from home. This monitoring reduces the number of office visits required to check your device to one time per year. It allows us to keep an eye on the functioning of your device to ensure it is working properly. You are scheduled for a device check from home on 05/03/2012. You may send your transmission at any time that day. If you have a wireless device, the transmission will be sent automatically. After your physician reviews your transmission, you will receive a postcard with your next transmission date.   

## 2012-02-29 ENCOUNTER — Other Ambulatory Visit: Payer: Self-pay | Admitting: Cardiology

## 2012-05-03 ENCOUNTER — Encounter: Payer: Self-pay | Admitting: Internal Medicine

## 2012-05-03 ENCOUNTER — Telehealth: Payer: Self-pay | Admitting: Internal Medicine

## 2012-05-03 ENCOUNTER — Ambulatory Visit (INDEPENDENT_AMBULATORY_CARE_PROVIDER_SITE_OTHER): Payer: Medicare Other | Admitting: *Deleted

## 2012-05-03 DIAGNOSIS — I442 Atrioventricular block, complete: Secondary | ICD-10-CM

## 2012-05-03 DIAGNOSIS — Z950 Presence of cardiac pacemaker: Secondary | ICD-10-CM

## 2012-05-03 NOTE — Telephone Encounter (Signed)
New msg Pt is having trouble sending transmission. Please call

## 2012-05-03 NOTE — Telephone Encounter (Signed)
Several troubleshooting was gone over with neighbor about sending transmissions. Instructed to try and send again and if does not go thru then call Medtronic tech services.

## 2012-05-07 LAB — REMOTE PACEMAKER DEVICE
AL IMPEDENCE PM: 404 Ohm
AL THRESHOLD: 0.25 V
RV LEAD IMPEDENCE PM: 547 Ohm
RV LEAD THRESHOLD: 0.625 V

## 2012-06-01 ENCOUNTER — Encounter: Payer: Self-pay | Admitting: *Deleted

## 2012-06-03 ENCOUNTER — Other Ambulatory Visit: Payer: Self-pay | Admitting: Internal Medicine

## 2012-08-06 ENCOUNTER — Ambulatory Visit (INDEPENDENT_AMBULATORY_CARE_PROVIDER_SITE_OTHER): Payer: Medicare Other | Admitting: *Deleted

## 2012-08-06 DIAGNOSIS — I442 Atrioventricular block, complete: Secondary | ICD-10-CM

## 2012-08-06 DIAGNOSIS — Z950 Presence of cardiac pacemaker: Secondary | ICD-10-CM

## 2012-08-09 DIAGNOSIS — Z950 Presence of cardiac pacemaker: Secondary | ICD-10-CM

## 2012-08-09 DIAGNOSIS — I442 Atrioventricular block, complete: Secondary | ICD-10-CM

## 2012-08-10 LAB — REMOTE PACEMAKER DEVICE
AL IMPEDENCE PM: 415 Ohm
ATRIAL PACING PM: 1
RV LEAD THRESHOLD: 0.625 V

## 2012-08-15 ENCOUNTER — Encounter: Payer: Self-pay | Admitting: *Deleted

## 2012-09-03 ENCOUNTER — Encounter: Payer: Self-pay | Admitting: Internal Medicine

## 2012-11-12 ENCOUNTER — Ambulatory Visit (INDEPENDENT_AMBULATORY_CARE_PROVIDER_SITE_OTHER): Payer: Medicare Other | Admitting: *Deleted

## 2012-11-12 ENCOUNTER — Encounter: Payer: Self-pay | Admitting: Internal Medicine

## 2012-11-12 DIAGNOSIS — I442 Atrioventricular block, complete: Secondary | ICD-10-CM

## 2012-11-12 DIAGNOSIS — Z950 Presence of cardiac pacemaker: Secondary | ICD-10-CM

## 2012-11-13 LAB — REMOTE PACEMAKER DEVICE
AL AMPLITUDE: 2.8 mv
AL IMPEDENCE PM: 416 Ohm
AL THRESHOLD: 0.25 V
RV LEAD IMPEDENCE PM: 576 Ohm
RV LEAD THRESHOLD: 0.625 V

## 2012-11-20 ENCOUNTER — Encounter: Payer: Self-pay | Admitting: *Deleted

## 2012-12-03 ENCOUNTER — Other Ambulatory Visit: Payer: Self-pay | Admitting: Internal Medicine

## 2013-01-10 ENCOUNTER — Encounter: Payer: Self-pay | Admitting: Internal Medicine

## 2013-01-10 ENCOUNTER — Ambulatory Visit (INDEPENDENT_AMBULATORY_CARE_PROVIDER_SITE_OTHER): Payer: Medicare Other | Admitting: Internal Medicine

## 2013-01-10 ENCOUNTER — Telehealth: Payer: Self-pay | Admitting: Internal Medicine

## 2013-01-10 VITALS — BP 138/53 | HR 59 | Ht <= 58 in | Wt 94.0 lb

## 2013-01-10 DIAGNOSIS — Z950 Presence of cardiac pacemaker: Secondary | ICD-10-CM

## 2013-01-10 DIAGNOSIS — I442 Atrioventricular block, complete: Secondary | ICD-10-CM

## 2013-01-10 DIAGNOSIS — I10 Essential (primary) hypertension: Secondary | ICD-10-CM

## 2013-01-10 LAB — PACEMAKER DEVICE OBSERVATION
AL AMPLITUDE: 4 mv
AL IMPEDENCE PM: 411 Ohm
AL THRESHOLD: 0.25 V
BATTERY VOLTAGE: 2.73 V
VENTRICULAR PACING PM: 100

## 2013-01-10 NOTE — Telephone Encounter (Signed)
Spoke with patient and let her know we had those medications listed for her

## 2013-01-10 NOTE — Patient Instructions (Addendum)
Your physician wants you to follow-up in: 12 months with Dr Taylor You will receive a reminder letter in the mail two months in advance. If you don't receive a letter, please call our office to schedule the follow-up appointment.   Remote monitoring is used to monitor your Pacemaker of ICD from home. This monitoring reduces the number of office visits required to check your device to one time per year. It allows us to keep an eye on the functioning of your device to ensure it is working properly. You are scheduled for a device check from home on 04/08/13. You may send your transmission at any time that day. If you have a wireless device, the transmission will be sent automatically. After your physician reviews your transmission, you will receive a postcard with your next transmission date.   

## 2013-01-10 NOTE — Progress Notes (Signed)
HPI Erin [name] is a very pleasant 79 year old woman with a history of complete heart block, status post permanent pacemaker insertion, hypertension, and dyslipidemia. In the interim, she has been stable. She denies chest pain, or shortness of breath. No syncope or peripheral edema. No cough or hemoptysis. Allergies  Allergen Reactions  . Toprol Xl (Metoprolol Succinate)      Current Outpatient Prescriptions  Medication Sig Dispense Refill  . aspirin 325 MG tablet Take 325 mg by mouth daily.      . Calcium Carb-Cholecalciferol (CALCIUM 1000 + D PO) Take 2 tablets by mouth daily.      . calcium carbonate (TUMS EX) 750 MG chewable tablet Chew 4 tablets by mouth daily.      . chlordiazePOXIDE (LIBRIUM) 10 MG capsule Take 10 mg by mouth daily.      . Coenzyme Q10 (COQ-10) 400 MG CAPS Take 1 tablet by mouth daily.      Marland Kitchen CRANBERRY FRUIT PO Take 2,000 mcg by mouth 4 (four) times daily.      . fish oil-omega-3 fatty acids 1000 MG capsule Take 2 g by mouth daily.      . furosemide (LASIX) 20 MG tablet TAKE 1 TABLET (20 MG TOTAL) BY MOUTH DAILY.  90 tablet  1  . Green Tea, Camillia sinensis, (CVS GREEN TEA EXTRACT PO) Take by mouth 4 (four) times daily.      Marland Kitchen levothyroxine (SYNTHROID, LEVOTHROID) 75 MCG tablet Take 75 mcg by mouth daily.      . Misc Natural Products (OSTEO BI-FLEX ADV JOINT SHIELD PO) Take 1 tablet by mouth daily.      . Multiple Vitamins-Minerals (CENTRUM SPECIALIST HEART PO) Take 2 tablets by mouth daily.      . simvastatin (ZOCOR) 40 MG tablet TAKE 1 TABLET BY MOUTH DAILY  90 tablet  3   No current facility-administered medications for this visit.     Past Medical History  Diagnosis Date  . Carotid artery plaque   . BBB (bundle branch block)   . HTN (hypertension)   . Hypothyroidism   . Breast cancer   . Hyperlipidemia   . DVT (deep venous thrombosis)   . Anxiety   . Diastolic CHF   . H/O: hysterectomy     ROS:   All systems reviewed and negative except as  noted in the HPI.   Past Surgical History  Procedure Laterality Date  . Mastectomy    . Cataracts    . Pacemaker insertion    . Carpal tunnel release    . Cardiac catheterization  2007     Family History  Problem Relation Age of Onset  . Unexplained death Mother 20  . Unexplained death Father 10     History   Social History  . Marital Status: Widowed    Spouse Name: N/A    Number of Children: N/A  . Years of Education: N/A   Occupational History  . Not on file.   Social History Main Topics  . Smoking status: Never Smoker   . Smokeless tobacco: Never Used  . Alcohol Use: No  . Drug Use: No  . Sexually Active: Not on file   Other Topics Concern  . Not on file   Social History Narrative  . No narrative on file     BP 138/53  Pulse 59  Ht 4' 7.5" (1.41 m)  Wt 94 lb (42.638 kg)  BMI 21.45 kg/m2  Physical Exam:  Well appearing elderly woman,NAD HEENT:  Unremarkable Neck:  No JVD, no thyromegally Lungs:  Clear with no wheezes, rales, or rhonchi. HEART:  Regular rate rhythm, no murmurs, no rubs, no clicks Abd:  soft, positive bowel sounds, no organomegally, no rebound, no guarding Ext:  2 plus pulses, no edema, no cyanosis, no clubbing Skin:  No rashes no nodules Neuro:  CN II through XII intact, motor grossly intact  DEVICE  Normal device function.  See PaceArt for details.   Assess/Plan:

## 2013-01-10 NOTE — Assessment & Plan Note (Signed)
Her Medtronic dual-chamber pacemaker is working normally. We'll plan to recheck in several months. 

## 2013-01-10 NOTE — Telephone Encounter (Signed)
New problem   Pt would like for you to know she takes Levathyroxine 75mg .Marland KitchenMarland KitchenSimbastatin 40mg .Marland KitchenMarland KitchenChlordiazepoxide 10mg .Marland KitchenMarland KitchenFurosimide 20mg .Marland KitchenMarland KitchenMarland Kitchen

## 2013-01-10 NOTE — Assessment & Plan Note (Signed)
Her blood pressure is well compensated. She will continue her current medical therapy.

## 2013-04-08 ENCOUNTER — Ambulatory Visit (INDEPENDENT_AMBULATORY_CARE_PROVIDER_SITE_OTHER): Payer: Medicare Other | Admitting: *Deleted

## 2013-04-08 ENCOUNTER — Encounter: Payer: Self-pay | Admitting: Internal Medicine

## 2013-04-08 DIAGNOSIS — I442 Atrioventricular block, complete: Secondary | ICD-10-CM

## 2013-04-08 DIAGNOSIS — Z950 Presence of cardiac pacemaker: Secondary | ICD-10-CM

## 2013-04-08 LAB — REMOTE PACEMAKER DEVICE
AL AMPLITUDE: 2.8 mv
AL THRESHOLD: 0.25 V
BAMS-0001: 175 {beats}/min
BATTERY VOLTAGE: 2.72 V

## 2013-05-16 ENCOUNTER — Telehealth: Payer: Self-pay | Admitting: Internal Medicine

## 2013-05-16 NOTE — Telephone Encounter (Signed)
New Problem  Pt is wanting the results of her pacemaker transmission for June 2

## 2013-05-16 NOTE — Telephone Encounter (Signed)
LMOM in regards to remote transmission on 04-08-13.

## 2013-05-17 ENCOUNTER — Encounter: Payer: Self-pay | Admitting: *Deleted

## 2013-07-11 ENCOUNTER — Ambulatory Visit: Payer: Self-pay | Admitting: Nurse Practitioner

## 2013-07-16 ENCOUNTER — Encounter: Payer: Medicare Other | Admitting: *Deleted

## 2013-07-16 ENCOUNTER — Ambulatory Visit (INDEPENDENT_AMBULATORY_CARE_PROVIDER_SITE_OTHER): Payer: Medicare Other | Admitting: *Deleted

## 2013-07-16 DIAGNOSIS — Z950 Presence of cardiac pacemaker: Secondary | ICD-10-CM

## 2013-07-16 DIAGNOSIS — I442 Atrioventricular block, complete: Secondary | ICD-10-CM

## 2013-07-24 LAB — REMOTE PACEMAKER DEVICE
AL AMPLITUDE: 2.8 mv
AL IMPEDENCE PM: 430 Ohm
AL THRESHOLD: 0.375 V
BATTERY VOLTAGE: 2.72 V
RV LEAD IMPEDENCE PM: 582 Ohm
VENTRICULAR PACING PM: 100

## 2013-08-02 ENCOUNTER — Encounter: Payer: Self-pay | Admitting: *Deleted

## 2013-08-10 ENCOUNTER — Encounter: Payer: Self-pay | Admitting: Internal Medicine

## 2013-08-29 ENCOUNTER — Ambulatory Visit (INDEPENDENT_AMBULATORY_CARE_PROVIDER_SITE_OTHER): Payer: Medicare Other | Admitting: Nurse Practitioner

## 2013-08-29 ENCOUNTER — Encounter: Payer: Self-pay | Admitting: Nurse Practitioner

## 2013-08-29 ENCOUNTER — Encounter (INDEPENDENT_AMBULATORY_CARE_PROVIDER_SITE_OTHER): Payer: Self-pay

## 2013-08-29 VITALS — BP 123/68 | HR 62 | Ht <= 58 in | Wt 95.0 lb

## 2013-08-29 DIAGNOSIS — I10 Essential (primary) hypertension: Secondary | ICD-10-CM

## 2013-08-29 DIAGNOSIS — I739 Peripheral vascular disease, unspecified: Secondary | ICD-10-CM | POA: Insufficient documentation

## 2013-08-29 DIAGNOSIS — Z8673 Personal history of transient ischemic attack (TIA), and cerebral infarction without residual deficits: Secondary | ICD-10-CM | POA: Insufficient documentation

## 2013-08-29 NOTE — Patient Instructions (Addendum)
Continue ASA for secondary stroke prevention Repeat carotid Doppler Systolic blood pressure less than 130 Keep LDL cholesterol below 100 F/U in 1 year

## 2013-08-29 NOTE — Progress Notes (Signed)
[facility]  PATIENT: Erin [name] DOB: 1919/12/11   REASON FOR VISIT: Followup for previous stroke   HISTORY OF PRESENT ILLNESS: Erin [name], 79 year old female returns for followup. She was last seen 01/08/2013. She continues to live independently with life alert. She no longer drives. He has not had any recurrent stroke symptoms. Last carotid ultrasound 01/31/2012 , she has bilateral mild plaques without hemodynamically significant stenosis. She is currently on aspirin with minimal bruising he is very hard of hearing.    HISTORY: Erin [name] is a 69 year pleasant Caucasian lady with  admission to  [facility] for stroke on 06/01/09. She was admitted with sudden onset left hemiparesis but presented beyond time window for intervention. Initial Ct head was unremarkable. MRI could not be done due to having a pacemaker. CT angio showed 50-60%  subclavian stenosis but no significant carotid or intracranial stenosis. Rt corona radiata white matter infarct was noted on repeat CT head.Cholestrol was 233 with LDL 160.HbA1c was 5.7 She was sent to short term SNF and received  PT/OT and went home with home health and  finished out patient therapies and has done very well. She was changed from 81 to 325 mg Aspirin.   TODAY: 07/11/12: She  returns for followup after last visit on 01/22/29 in with Darrol Angel..She continues to live independently and walks without assisitance. She now has life alert.  Denies dragging her left leg or problems with fine motor skills. She is tolerating her medicines without side effects. She has not had any recurrent stroke symptoms.  She  had carotid ultrasound done on 01/31/12  which shows bilateral mild plaques without hemodynamically significant stenosis. . No new neurological complaints.  01/08/13: Patient returns for followup after last visit with Dr. Pearlean Brownie 07/21/12. She has life alert and lives independently. She does not drive. She has not had  recurrent stroke symptoms. She  had carotid ultrasound done on 01/31/12  which shows bilateral mild plaques without hemodynamically significant stenosis. She remains on asa with minimal bruising. Denies new neurologic complaints.       REVIEW OF SYSTEMS: Full 14 system review of systems performed and notable only for:  Constitutional: N/A  Cardiovascular: N/A  Ear/Nose/Throat: Hard of hearing  Skin: N/A  Eyes: N/A  Respiratory: Shortness of breath  Gastroitestinal: N/A  Hematology/Lymphatic: N/A  Endocrine: N/A Musculoskeletal:N/A  Allergy/Immunology: N/A  Neurological: N/A Psychiatric: N/A   ALLERGIES: Allergies  Allergen Reactions  . Toprol Xl [Metoprolol Succinate]     HOME MEDICATIONS: Outpatient Prescriptions Prior to Visit  Medication Sig Dispense Refill  . aspirin 325 MG tablet Take 325 mg by mouth daily.      . Calcium Carb-Cholecalciferol (CALCIUM 1000 + D PO) Take 2 tablets by mouth daily.      . calcium carbonate (TUMS EX) 750 MG chewable tablet Chew 4 tablets by mouth daily.      . chlordiazePOXIDE (LIBRIUM) 10 MG capsule Take 10 mg by mouth daily.      . Coenzyme Q10 (COQ-10) 400 MG CAPS Take 1 tablet by mouth daily.      Marland Kitchen CRANBERRY FRUIT PO Take 2,000 mcg by mouth 4 (four) times daily.      . fish oil-omega-3 fatty acids 1000 MG capsule Take 2 g by mouth daily.      . furosemide (LASIX) 20 MG tablet TAKE 1 TABLET (20 MG TOTAL) BY MOUTH DAILY.  90 tablet  1  . Green Tea, Camillia sinensis, ([employer] GREEN  TEA EXTRACT PO) Take by mouth 4 (four) times daily.      Marland Kitchen levothyroxine (SYNTHROID, LEVOTHROID) 75 MCG tablet Take 75 mcg by mouth daily.      . Misc Natural Products (OSTEO BI-FLEX ADV JOINT SHIELD PO) Take 1 tablet by mouth daily.      . Multiple Vitamins-Minerals (CENTRUM SPECIALIST HEART PO) Take 2 tablets by mouth daily.      . simvastatin (ZOCOR) 40 MG tablet TAKE 1 TABLET BY MOUTH DAILY  90 tablet  3   No facility-administered medications prior to visit.      PAST MEDICAL HISTORY: Past Medical History  Diagnosis Date  . Carotid artery plaque   . BBB (bundle branch block)   . HTN (hypertension)   . Hypothyroidism   . Breast cancer   . Hyperlipidemia   . DVT (deep venous thrombosis)   . Anxiety   . Diastolic CHF   . H/O: hysterectomy     PAST SURGICAL HISTORY: Past Surgical History  Procedure Laterality Date  . Mastectomy    . Cataracts    . Pacemaker insertion    . Carpal tunnel release    . Cardiac catheterization  2007    FAMILY HISTORY: Family History  Problem Relation Age of Onset  . Unexplained death Mother 51  . Unexplained death Father 59    SOCIAL HISTORY: History   Social History  . Marital Status: Widowed    Spouse Name: N/A    Number of Children: N/A  . Years of Education: N/A   Occupational History  . Not on file.   Social History Main Topics  . Smoking status: Never Smoker   . Smokeless tobacco: Never Used  . Alcohol Use: No  . Drug Use: No  . Sexual Activity: Not on file   Other Topics Concern  . Not on file   Social History Narrative   Patient is a widow.    Patient lives alone   Patient has 2 adopted children   Patient is retired   Patient has a high school education.           PHYSICAL EXAM  Filed Vitals:   08/29/13 1433  BP: 123/68  Pulse: 62  Height: 4\' 9"  (1.448 m)  Weight: 95 lb (43.092 kg)   Body mass index is 20.55 kg/(m^2).  Generalized: Well developed, in no acute distress  Head: normocephalic and atraumatic,. Oropharynx benign  Neck: Supple, no carotid bruits  Cardiac: Regular rate rhythm, no murmur  Musculoskeletal: Severe kyphosis Neurological examination   Mentation: Alert oriented to time, place, history taking. Follows all commands speech and language fluent  Cranial nerve II-XII: Fundi not examined. Pupils were equal round reactive to light extraocular movements were full, visual field were full on confrontational test. Facial sensation and strength  were normal. Very hard of hearing  Uvula tongue midline. head turning and shoulder shrug and were normal and symmetric.Tongue protrusion into cheek strength was normal. Motor: normal bulk and tone, full strength in the BUE, BLE, fine finger movements normal, no pronator drift. No focal weakness Sensory: normal and symmetric to light touch, pinprick, and  vibration  Coordination: finger-nose-finger,  no dysmetria Reflexes: 1+ of her lower and symmetric  Gait and Station: Cautious gait gait, short strides, no assistive device. No difficulty with turns  DIAGNOSTIC DATA (LABS, IMAGING, TESTING) -None to review     ASSESSMENT AND PLAN  79 y.o. year old female  has a past medical history of Carotid artery  plaque; BBB (bundle branch block); HTN (hypertension); Hypothyroidism; Breast cancer; Hyperlipidemia; DVT (deep venous thrombosis); Anxiety; Diastolic CHF; and H/O: hysterectomy. here followup for right subcortical infarct (06/01/09)due to small vessel disease. Risk factors of hypertension and hyperlipidemia.  Continue ASA for secondary stroke prevention Repeat carotid Doppler Systolic blood pressure less than 130 Keep LDL cholesterol below 100 F/U in 1 year Nilda Riggs, [facility], [facility], APRN  [facility] [address] [place], [place] [address] [phone]

## 2013-09-11 ENCOUNTER — Ambulatory Visit (INDEPENDENT_AMBULATORY_CARE_PROVIDER_SITE_OTHER): Payer: Medicare Other

## 2013-09-11 DIAGNOSIS — I10 Essential (primary) hypertension: Secondary | ICD-10-CM

## 2013-09-11 DIAGNOSIS — Z8673 Personal history of transient ischemic attack (TIA), and cerebral infarction without residual deficits: Secondary | ICD-10-CM

## 2013-09-11 DIAGNOSIS — I739 Peripheral vascular disease, unspecified: Secondary | ICD-10-CM

## 2013-09-11 DIAGNOSIS — I6350 Cerebral infarction due to unspecified occlusion or stenosis of unspecified cerebral artery: Secondary | ICD-10-CM

## 2013-09-13 ENCOUNTER — Telehealth: Payer: Self-pay | Admitting: Nurse Practitioner

## 2013-09-13 NOTE — Telephone Encounter (Signed)
Carotid doppler was negative for significant stenosis. Please call patient

## 2013-09-17 NOTE — Telephone Encounter (Signed)
I called and relayed the carotid doppler results (negative for significant narrowing of the large blood vessel in her neck), no blockages.

## 2013-10-24 ENCOUNTER — Ambulatory Visit (INDEPENDENT_AMBULATORY_CARE_PROVIDER_SITE_OTHER): Payer: Medicare Other | Admitting: *Deleted

## 2013-10-24 ENCOUNTER — Encounter: Payer: Self-pay | Admitting: Internal Medicine

## 2013-10-24 DIAGNOSIS — Z950 Presence of cardiac pacemaker: Secondary | ICD-10-CM

## 2013-10-24 DIAGNOSIS — I442 Atrioventricular block, complete: Secondary | ICD-10-CM

## 2013-10-24 LAB — MDC_IDC_ENUM_SESS_TYPE_REMOTE
Battery Remaining Longevity: 16 mo
Battery Voltage: 2.7 V
Brady Statistic AS VP Percent: 97 %
Lead Channel Impedance Value: 570 Ohm
Lead Channel Pacing Threshold Amplitude: 0.625 V
Lead Channel Pacing Threshold Pulse Width: 0.4 ms
Lead Channel Sensing Intrinsic Amplitude: 2.8 mV
Lead Channel Setting Pacing Amplitude: 2 V
Lead Channel Setting Pacing Pulse Width: 0.4 ms
Lead Channel Setting Sensing Sensitivity: 2.8 mV

## 2013-11-04 ENCOUNTER — Encounter: Payer: Self-pay | Admitting: *Deleted

## 2013-11-05 ENCOUNTER — Encounter: Payer: Self-pay | Admitting: Neurology

## 2013-12-02 ENCOUNTER — Other Ambulatory Visit: Payer: Self-pay | Admitting: Internal Medicine

## 2014-01-22 ENCOUNTER — Encounter: Payer: Self-pay | Admitting: Internal Medicine

## 2014-01-22 ENCOUNTER — Ambulatory Visit (INDEPENDENT_AMBULATORY_CARE_PROVIDER_SITE_OTHER): Payer: Medicare Other | Admitting: Internal Medicine

## 2014-01-22 VITALS — BP 153/74 | HR 68 | Ht <= 58 in | Wt 95.0 lb

## 2014-01-22 DIAGNOSIS — Z950 Presence of cardiac pacemaker: Secondary | ICD-10-CM

## 2014-01-22 DIAGNOSIS — I10 Essential (primary) hypertension: Secondary | ICD-10-CM

## 2014-01-22 DIAGNOSIS — I442 Atrioventricular block, complete: Secondary | ICD-10-CM

## 2014-01-22 LAB — MDC_IDC_ENUM_SESS_TYPE_INCLINIC
Battery Voltage: 2.69 V
Brady Statistic AP VS Percent: 0 %
Brady Statistic AS VP Percent: 97 %
Date Time Interrogation Session: 20150318153150
Lead Channel Impedance Value: 549 Ohm
Lead Channel Pacing Threshold Amplitude: 0.5 V
Lead Channel Pacing Threshold Amplitude: 0.75 V
Lead Channel Pacing Threshold Pulse Width: 0.4 ms
Lead Channel Pacing Threshold Pulse Width: 0.4 ms
Lead Channel Sensing Intrinsic Amplitude: 4 mV
MDC IDC MSMT BATTERY IMPEDANCE: 3140 Ohm
MDC IDC MSMT BATTERY REMAINING LONGEVITY: 14 mo
MDC IDC MSMT LEADCHNL RA IMPEDANCE VALUE: 412 Ohm
MDC IDC SET LEADCHNL RA PACING AMPLITUDE: 2 V
MDC IDC SET LEADCHNL RV PACING AMPLITUDE: 2.5 V
MDC IDC SET LEADCHNL RV PACING PULSEWIDTH: 0.4 ms
MDC IDC SET LEADCHNL RV SENSING SENSITIVITY: 2.8 mV
MDC IDC STAT BRADY AP VP PERCENT: 3 %
MDC IDC STAT BRADY AS VS PERCENT: 0 %

## 2014-01-22 NOTE — Assessment & Plan Note (Signed)
Her Medtronic DDD PM is working normally and has just over a year of battery longevity. Will follow.

## 2014-01-22 NOTE — Patient Instructions (Signed)
Your physician wants you to follow-up in: 12 months with Dr Knox Saliva will receive a reminder letter in the mail two months in advance. If you don't receive a letter, please call our office to schedule the follow-up appointment.   Remote monitoring is used to monitor your Pacemaker or ICD from home. This monitoring reduces the number of office visits required to check your device to one time per year. It allows [place] to keep an eye on the functioning of your device to ensure it is working properly. You are scheduled for a device check from home on 04/28/14. You may send your transmission at any time that day. If you have a wireless device, the transmission will be sent automatically. After your physician reviews your transmission, you will receive a postcard with your next transmission date.

## 2014-01-22 NOTE — Progress Notes (Signed)
HPI Erin [name] is a very pleasant 80 year old woman with a history of complete heart block, status post permanent pacemaker insertion, hypertension, and dyslipidemia. In the interim, she has been stable. She denies chest pain, or shortness of breath. No syncope or peripheral edema. No cough or hemoptysis. She has had intermittant right leg swelling.  Allergies  Allergen Reactions  . Toprol Xl [Metoprolol Succinate]      Current Outpatient Prescriptions  Medication Sig Dispense Refill  . aspirin 325 MG tablet Take 325 mg by mouth daily.      . Calcium Carb-Cholecalciferol (CALCIUM 1000 + D PO) Take 2 tablets by mouth daily.      . calcium carbonate (TUMS EX) 750 MG chewable tablet Chew 4 tablets by mouth daily.      . chlordiazePOXIDE (LIBRIUM) 10 MG capsule Take 10 mg by mouth daily.      . Coenzyme Q10 (COQ-10) 400 MG CAPS Take 1 tablet by mouth daily.      Marland Kitchen CRANBERRY FRUIT PO Take 2,000 mcg by mouth 4 (four) times daily.      . fish oil-omega-3 fatty acids 1000 MG capsule Take 2 g by mouth daily.      . furosemide (LASIX) 20 MG tablet TAKE 1 TABLET (20 MG TOTAL) BY MOUTH DAILY.  90 tablet  0  . Green Tea, Camillia sinensis, ([employer]S GREEN TEA EXTRACT PO) Take by mouth 4 (four) times daily.      Marland Kitchen levothyroxine (SYNTHROID, LEVOTHROID) 75 MCG tablet Take 75 mcg by mouth daily.      . Misc Natural Products (OSTEO BI-FLEX ADV JOINT SHIELD PO) Take 1 tablet by mouth daily.      . Multiple Vitamins-Minerals (CENTRUM SPECIALIST HEART PO) Take 2 tablets by mouth daily.      . simvastatin (ZOCOR) 40 MG tablet TAKE 1 TABLET BY MOUTH DAILY  90 tablet  3   No current facility-administered medications for this visit.     Past Medical History  Diagnosis Date  . Carotid artery plaque   . BBB (bundle branch block)   . HTN (hypertension)   . Hypothyroidism   . Breast cancer   . Hyperlipidemia   . DVT (deep venous thrombosis)   . Anxiety   . Diastolic CHF   . H/O: hysterectomy     ROS:   All systems reviewed and negative except as noted in the HPI.   Past Surgical History  Procedure Laterality Date  . Mastectomy    . Cataracts    . Pacemaker insertion    . Carpal tunnel release    . Cardiac catheterization  2007     Family History  Problem Relation Age of Onset  . Unexplained death Mother 70  . Unexplained death Father 79     History   Social History  . Marital Status: Widowed    Spouse Name: N/A    Number of Children: N/A  . Years of Education: N/A   Occupational History  . Not on file.   Social History Main Topics  . Smoking status: Never Smoker   . Smokeless tobacco: Never Used  . Alcohol Use: No  . Drug Use: No  . Sexual Activity: Not on file   Other Topics Concern  . Not on file   Social History Narrative   Patient is a widow.    Patient lives alone   Patient has 2 adopted children   Patient is retired   Patient has a high school education.  BP 153/74  Pulse 68  Ht 4\' 8"  (1.422 m)  Wt 95 lb (43.092 kg)  BMI 21.31 kg/m2  Physical Exam:  Frail, but Well appearing elderly woman,NAD HEENT: Unremarkable Neck:  No JVD, no thyromegally Lungs:  Clear with no wheezes, rales, or rhonchi. HEART:  Regular rate rhythm, no murmurs, no rubs, no clicks Abd:  soft, positive bowel sounds, no organomegally, no rebound, no guarding Ext:  2 plus pulses, no edema, no cyanosis, no clubbing Skin:  No rashes no nodules Neuro:  CN II through XII intact, motor grossly intact  DEVICE  Normal device function.  See PaceArt for details.   Assess/Plan:

## 2014-01-22 NOTE — Assessment & Plan Note (Signed)
Her blood pressure is fairly well controlled at home but elevated in our office. Will follow.

## 2014-02-27 ENCOUNTER — Other Ambulatory Visit: Payer: Self-pay | Admitting: Internal Medicine

## 2014-04-28 ENCOUNTER — Ambulatory Visit (INDEPENDENT_AMBULATORY_CARE_PROVIDER_SITE_OTHER): Payer: Medicare Other | Admitting: *Deleted

## 2014-04-28 ENCOUNTER — Telehealth: Payer: Self-pay | Admitting: Cardiology

## 2014-04-28 DIAGNOSIS — I442 Atrioventricular block, complete: Secondary | ICD-10-CM

## 2014-04-28 LAB — MDC_IDC_ENUM_SESS_TYPE_REMOTE
Battery Impedance: 4714 Ohm
Battery Remaining Longevity: 5 mo
Battery Voltage: 2.63 V
Brady Statistic AP VS Percent: 0 %
Date Time Interrogation Session: 20150622160449
Lead Channel Impedance Value: 585 Ohm
Lead Channel Pacing Threshold Amplitude: 0.375 V
Lead Channel Pacing Threshold Amplitude: 0.625 V
Lead Channel Pacing Threshold Pulse Width: 0.4 ms
Lead Channel Setting Pacing Amplitude: 2 V
Lead Channel Setting Pacing Amplitude: 2.5 V
Lead Channel Setting Pacing Pulse Width: 0.4 ms
MDC IDC MSMT LEADCHNL RA IMPEDANCE VALUE: 442 Ohm
MDC IDC MSMT LEADCHNL RA PACING THRESHOLD PULSEWIDTH: 0.4 ms
MDC IDC MSMT LEADCHNL RA SENSING INTR AMPL: 2.8 mV
MDC IDC SET LEADCHNL RV SENSING SENSITIVITY: 2.8 mV
MDC IDC STAT BRADY AP VP PERCENT: 2 %
MDC IDC STAT BRADY AS VP PERCENT: 98 %
MDC IDC STAT BRADY AS VS PERCENT: 0 %

## 2014-04-28 NOTE — Telephone Encounter (Signed)
Spoke with pt and reminded pt of remote transmission that is due today. Pt verbalized understanding.   

## 2014-04-28 NOTE — Progress Notes (Signed)
Remote pacemaker transmission.   

## 2014-05-14 ENCOUNTER — Encounter: Payer: Self-pay | Admitting: Cardiology

## 2014-05-19 ENCOUNTER — Encounter: Payer: Self-pay | Admitting: Internal Medicine

## 2014-06-05 ENCOUNTER — Ambulatory Visit (INDEPENDENT_AMBULATORY_CARE_PROVIDER_SITE_OTHER): Payer: Medicare Other | Admitting: *Deleted

## 2014-06-05 DIAGNOSIS — Z950 Presence of cardiac pacemaker: Secondary | ICD-10-CM

## 2014-06-05 DIAGNOSIS — I442 Atrioventricular block, complete: Secondary | ICD-10-CM

## 2014-06-05 LAB — MDC_IDC_ENUM_SESS_TYPE_REMOTE
Battery Impedance: 5709 Ohm
Battery Remaining Longevity: 1 mo
Battery Voltage: 2.6 V
Brady Statistic AP VP Percent: 2 %
Brady Statistic AP VS Percent: 0 %
Brady Statistic AS VP Percent: 98 %
Brady Statistic AS VS Percent: 0 %
Lead Channel Impedance Value: 427 Ohm
Lead Channel Setting Pacing Amplitude: 2 V
Lead Channel Setting Pacing Amplitude: 2.5 V
Lead Channel Setting Sensing Sensitivity: 2.8 mV
MDC IDC MSMT LEADCHNL RV IMPEDANCE VALUE: 563 Ohm
MDC IDC SESS DTM: 20150730160755
MDC IDC SET LEADCHNL RV PACING PULSEWIDTH: 0.4 ms

## 2014-06-11 NOTE — Addendum Note (Signed)
Addended by: Kendell Bane on: 06/11/2014 04:23 PM   Modules accepted: Level of Service

## 2014-06-13 ENCOUNTER — Encounter: Payer: Self-pay | Admitting: Cardiology

## 2014-06-16 ENCOUNTER — Telehealth: Payer: Self-pay | Admitting: Internal Medicine

## 2014-06-16 NOTE — Telephone Encounter (Signed)
Updated pt w/ results of recent battery check. Pt knows next [facility] is 07/15/14.

## 2014-06-16 NOTE — Telephone Encounter (Signed)
New message          Pt would like information about her pacer check

## 2014-06-17 ENCOUNTER — Telehealth: Payer: Self-pay | Admitting: Internal Medicine

## 2014-06-17 NOTE — Telephone Encounter (Signed)
Increase fluid intake. GT

## 2014-06-17 NOTE — Telephone Encounter (Signed)
Pt calls today she fell Friday night at home then fell again Sunday at home.  She thought it might be her pacemaker.  Blood pressure readings:  Friday:      108/56   HR 65   Pt fell that night Saturday:   70/43    HR 66  Pt went to walk in clinic b/c of fall on Friday night Sunday:     93/60    HR 65   Pt fell Monday:     98 /60    HR  65   Pt saw pcp Tuesday:   117/58   HR  65  Denies any dizziness,lightheadedness, no shortness of breath, no chest pain, no angina, no syncope during either of the falls.  Also no edema, no weight gain. States she lives alone but her daughter is currently with her. Advised to call 911 if symptoms reoccur.  I questioned dehydration: Pt states she drinks 4 ounces of fluid three times a day & has done so for about 5 years "someone told me to drink only a small amount of liquids"  Weights range:  92.6-95 pounds  Will forward to Dr. Lovena Le for review. Horton Chin RN

## 2014-06-17 NOTE — Telephone Encounter (Signed)
New problem   Pt has fallen twice and want to know if her pacer is causing this problem.

## 2014-06-18 NOTE — Telephone Encounter (Signed)
I spoke with pt & she will increase her fluid intake & continue to monitor blood pressure daily She will call back if she has any symptoms of edema, shortness of breath, dizziness, lightheadedness  Pt agrees & understands Horton Chin RN

## 2014-06-20 ENCOUNTER — Encounter: Payer: Self-pay | Admitting: Internal Medicine

## 2014-07-01 NOTE — Telephone Encounter (Signed)
Her blood pressure is too low. Hold lasix.

## 2014-07-15 ENCOUNTER — Ambulatory Visit (INDEPENDENT_AMBULATORY_CARE_PROVIDER_SITE_OTHER): Payer: Medicare Other | Admitting: *Deleted

## 2014-07-15 ENCOUNTER — Telehealth: Payer: Self-pay | Admitting: Cardiology

## 2014-07-15 DIAGNOSIS — Z950 Presence of cardiac pacemaker: Secondary | ICD-10-CM

## 2014-07-15 NOTE — Telephone Encounter (Signed)
LMOVM reminding pt to send remote transmission.   

## 2014-07-15 NOTE — Progress Notes (Signed)
Remote pacemaker transmission.   

## 2014-07-18 LAB — MDC_IDC_ENUM_SESS_TYPE_REMOTE
Battery Remaining Longevity: 2 mo
Brady Statistic RV Percent Paced: 100 %
Lead Channel Impedance Value: 517 Ohm
Lead Channel Impedance Value: 67 Ohm
Lead Channel Setting Pacing Amplitude: 2.5 V
Lead Channel Setting Sensing Sensitivity: 2.8 mV
MDC IDC MSMT BATTERY IMPEDANCE: 7068 Ohm
MDC IDC MSMT BATTERY VOLTAGE: 2.6 V
MDC IDC SESS DTM: 20150908163729
MDC IDC SET LEADCHNL RV PACING PULSEWIDTH: 0.4 ms

## 2014-07-22 ENCOUNTER — Encounter: Payer: Self-pay | Admitting: Internal Medicine

## 2014-07-22 ENCOUNTER — Ambulatory Visit (INDEPENDENT_AMBULATORY_CARE_PROVIDER_SITE_OTHER): Payer: Medicare Other | Admitting: Internal Medicine

## 2014-07-22 ENCOUNTER — Encounter: Payer: Self-pay | Admitting: *Deleted

## 2014-07-22 VITALS — BP 104/70 | HR 63 | Ht <= 58 in | Wt 93.8 lb

## 2014-07-22 DIAGNOSIS — Z950 Presence of cardiac pacemaker: Secondary | ICD-10-CM

## 2014-07-22 DIAGNOSIS — I10 Essential (primary) hypertension: Secondary | ICD-10-CM

## 2014-07-22 DIAGNOSIS — I442 Atrioventricular block, complete: Secondary | ICD-10-CM

## 2014-07-22 LAB — BASIC METABOLIC PANEL
BUN: 13 mg/dL (ref 6–23)
CALCIUM: 8.6 mg/dL (ref 8.4–10.5)
CO2: 29 meq/L (ref 19–32)
Chloride: 100 mEq/L (ref 96–112)
Creatinine, Ser: 0.8 mg/dL (ref 0.4–1.2)
GFR: 67.41 mL/min (ref >60.00)
Glucose, Bld: 81 mg/dL (ref 70–99)
Potassium: 3.8 mEq/L (ref 3.5–5.1)
SODIUM: 136 meq/L (ref 135–145)

## 2014-07-22 NOTE — Patient Instructions (Addendum)
See instruction sheet for procedure   Your physician recommends that you schedule a follow-up appointment in: 7-10 days from 08/07/14 in device clinic for wound check

## 2014-07-23 ENCOUNTER — Encounter: Payer: Self-pay | Admitting: Internal Medicine

## 2014-07-23 LAB — CBC WITH DIFFERENTIAL/PLATELET
BASOS ABS: 0.1 10*3/uL (ref 0.0–0.1)
Basophils Relative: 1.1 % (ref 0.0–3.0)
Eosinophils Absolute: 0.3 10*3/uL (ref 0.0–0.7)
Eosinophils Relative: 4.7 % (ref 0.0–5.0)
HCT: 38.5 % (ref 36.0–46.0)
HEMOGLOBIN: 12.9 g/dL (ref 12.0–15.0)
LYMPHS ABS: 1.5 10*3/uL (ref 0.7–4.0)
LYMPHS PCT: 26.2 % (ref 12.0–46.0)
MCHC: 33.6 g/dL (ref 30.0–36.0)
MCV: 89.9 fl (ref 78.0–100.0)
Monocytes Absolute: 0.5 10*3/uL (ref 0.1–1.0)
Monocytes Relative: 8.8 % (ref 3.0–12.0)
NEUTROS ABS: 3.5 10*3/uL (ref 1.4–7.7)
Neutrophils Relative %: 59.2 % (ref 43.0–77.0)
Platelets: 244 10*3/uL (ref 150.0–400.0)
RBC: 4.28 Mil/uL (ref 3.87–5.11)
RDW: 15.2 % (ref 11.5–15.5)
WBC: 5.8 10*3/uL (ref 4.0–10.5)

## 2014-07-23 NOTE — Progress Notes (Signed)
HPI Erin [name] is a very pleasant 80 year old woman with a history of complete heart block, status post permanent pacemaker insertion, hypertension, and dyslipidemia. In the interim, she has been stable. She denies chest pain, or shortness of breath. No syncope or peripheral edema. No cough or hemoptysis. She has had intermittant right leg swelling.  Allergies  Allergen Reactions  . Toprol Xl [Metoprolol Succinate]      Current Outpatient Prescriptions  Medication Sig Dispense Refill  . aspirin 325 MG tablet Take 325 mg by mouth daily.      . Calcium Carb-Cholecalciferol (CALCIUM 1000 + D PO) Take 2 tablets by mouth daily.      . calcium carbonate (TUMS EX) 750 MG chewable tablet Chew 2 tablets by mouth daily.       . chlordiazePOXIDE (LIBRIUM) 10 MG capsule Take 10 mg by mouth daily.      . Coenzyme Q10 (COQ-10) 400 MG CAPS Take 1 tablet by mouth daily.      Marland Kitchen CRANBERRY FRUIT PO Take 2,000 mcg by mouth 4 (four) times daily.      . fish oil-omega-3 fatty acids 1000 MG capsule Take 2 g by mouth daily.      . furosemide (LASIX) 20 MG tablet TAKE 1 TABLET (20 MG TOTAL) BY MOUTH DAILY.  90 tablet  1  . Green Tea, Camillia sinensis, ([employer]S GREEN TEA EXTRACT PO) Take by mouth 4 (four) times daily.      Marland Kitchen levothyroxine (SYNTHROID, LEVOTHROID) 75 MCG tablet Take 75 mcg by mouth daily.      . Misc Natural Products (OSTEO BI-FLEX ADV JOINT SHIELD PO) Take 1 tablet by mouth daily.      . Multiple Vitamins-Minerals (CENTRUM SPECIALIST HEART PO) Take 2 tablets by mouth daily.      . simvastatin (ZOCOR) 40 MG tablet TAKE 1 TABLET BY MOUTH DAILY  90 tablet  3   No current facility-administered medications for this visit.     Past Medical History  Diagnosis Date  . Carotid artery plaque   . BBB (bundle branch block)   . HTN (hypertension)   . Hypothyroidism   . Breast cancer   . Hyperlipidemia   . DVT (deep venous thrombosis)   . Anxiety   . Diastolic CHF   . H/O: hysterectomy     ROS:   All systems reviewed and negative except as noted in the HPI.   Past Surgical History  Procedure Laterality Date  . Mastectomy    . Cataracts    . Pacemaker insertion    . Carpal tunnel release    . Cardiac catheterization  2007     Family History  Problem Relation Age of Onset  . Unexplained death Mother 44  . Unexplained death Father 32     History   Social History  . Marital Status: Widowed    Spouse Name: N/A    Number of Children: N/A  . Years of Education: N/A   Occupational History  . Not on file.   Social History Main Topics  . Smoking status: Never Smoker   . Smokeless tobacco: Never Used  . Alcohol Use: No  . Drug Use: No  . Sexual Activity: Not on file   Other Topics Concern  . Not on file   Social History Narrative   Patient is a widow.    Patient lives alone   Patient has 2 adopted children   Patient is retired   Patient has a high school education.  BP 104/70  Pulse 63  Ht 4\' 10"  (1.473 m)  Wt 93 lb 12.8 oz (42.547 kg)  BMI 19.61 kg/m2  Physical Exam:  Frail, but Well appearing elderly woman,NAD HEENT: Unremarkable Neck:  No JVD, no thyromegally Lungs:  Clear with no wheezes, rales, or rhonchi. HEART:  Regular rate rhythm, no murmurs, no rubs, no clicks Abd:  soft, positive bowel sounds, no organomegally, no rebound, no guarding Ext:  2 plus pulses, no edema, no cyanosis, no clubbing Skin:  No rashes no nodules Neuro:  CN II through XII intact, motor grossly intact  DEVICE  Normal device function.  See PaceArt for details. She is at [facility]  Assess/Plan:

## 2014-07-23 NOTE — Assessment & Plan Note (Signed)
Her blood pressure is normal. Will continue her current meds.

## 2014-07-23 NOTE — Assessment & Plan Note (Signed)
Her PPM is at [facility]. We will schedule PM generator change.

## 2014-08-04 ENCOUNTER — Encounter (HOSPITAL_COMMUNITY): Payer: Self-pay | Admitting: Pharmacy Technician

## 2014-08-06 ENCOUNTER — Ambulatory Visit (HOSPITAL_COMMUNITY)
Admission: RE | Admit: 2014-08-06 | Discharge: 2014-08-07 | Disposition: A | Discharge status: Home / Self Care | Payer: Medicare Other | Source: Ambulatory Visit | Attending: Internal Medicine | Admitting: Internal Medicine

## 2014-08-06 DIAGNOSIS — Z45010 Encounter for checking and testing of cardiac pacemaker pulse generator [battery]: Secondary | ICD-10-CM | POA: Insufficient documentation

## 2014-08-06 DIAGNOSIS — Z7982 Long term (current) use of aspirin: Secondary | ICD-10-CM | POA: Diagnosis not present

## 2014-08-06 DIAGNOSIS — Z4541 Encounter for adjustment and management of cerebrospinal fluid drainage device: Secondary | ICD-10-CM | POA: Diagnosis not present

## 2014-08-06 DIAGNOSIS — Z86718 Personal history of other venous thrombosis and embolism: Secondary | ICD-10-CM | POA: Insufficient documentation

## 2014-08-06 DIAGNOSIS — I5030 Unspecified diastolic (congestive) heart failure: Secondary | ICD-10-CM | POA: Insufficient documentation

## 2014-08-06 DIAGNOSIS — I442 Atrioventricular block, complete: Secondary | ICD-10-CM | POA: Diagnosis not present

## 2014-08-06 DIAGNOSIS — Z79899 Other long term (current) drug therapy: Secondary | ICD-10-CM | POA: Insufficient documentation

## 2014-08-06 DIAGNOSIS — I10 Essential (primary) hypertension: Secondary | ICD-10-CM | POA: Insufficient documentation

## 2014-08-06 DIAGNOSIS — I509 Heart failure, unspecified: Secondary | ICD-10-CM | POA: Diagnosis not present

## 2014-08-06 DIAGNOSIS — E039 Hypothyroidism, unspecified: Secondary | ICD-10-CM | POA: Diagnosis not present

## 2014-08-06 DIAGNOSIS — Z853 Personal history of malignant neoplasm of breast: Secondary | ICD-10-CM | POA: Insufficient documentation

## 2014-08-06 DIAGNOSIS — Z950 Presence of cardiac pacemaker: Secondary | ICD-10-CM

## 2014-08-06 DIAGNOSIS — E785 Hyperlipidemia, unspecified: Secondary | ICD-10-CM | POA: Insufficient documentation

## 2014-08-06 MED ORDER — SODIUM CHLORIDE 0.9 % IR SOLN
80.0000 mg | Status: AC
  Filled 2014-08-06: qty 2

## 2014-08-06 MED ORDER — CEFAZOLIN SODIUM-DEXTROSE 2-3 GM-% IV SOLR: 2.0000 g | INTRAVENOUS | Status: AC

## 2014-08-06 MED ORDER — SODIUM CHLORIDE 0.9 % IV SOLN
INTRAVENOUS | Status: DC
  Administered 2014-08-07: 09:00:00 via INTRAVENOUS

## 2014-08-07 ENCOUNTER — Encounter (HOSPITAL_COMMUNITY)
Admission: RE | Disposition: A | Discharge status: Home / Self Care | Payer: Self-pay | Source: Ambulatory Visit | Attending: Internal Medicine

## 2014-08-07 DIAGNOSIS — Z86718 Personal history of other venous thrombosis and embolism: Secondary | ICD-10-CM | POA: Diagnosis not present

## 2014-08-07 DIAGNOSIS — E785 Hyperlipidemia, unspecified: Secondary | ICD-10-CM | POA: Diagnosis not present

## 2014-08-07 DIAGNOSIS — I442 Atrioventricular block, complete: Secondary | ICD-10-CM

## 2014-08-07 DIAGNOSIS — Z7982 Long term (current) use of aspirin: Secondary | ICD-10-CM | POA: Diagnosis not present

## 2014-08-07 DIAGNOSIS — E039 Hypothyroidism, unspecified: Secondary | ICD-10-CM | POA: Diagnosis not present

## 2014-08-07 DIAGNOSIS — I10 Essential (primary) hypertension: Secondary | ICD-10-CM | POA: Diagnosis not present

## 2014-08-07 DIAGNOSIS — Z853 Personal history of malignant neoplasm of breast: Secondary | ICD-10-CM | POA: Diagnosis not present

## 2014-08-07 DIAGNOSIS — Z45010 Encounter for checking and testing of cardiac pacemaker pulse generator [battery]: Secondary | ICD-10-CM | POA: Diagnosis not present

## 2014-08-07 DIAGNOSIS — Z79899 Other long term (current) drug therapy: Secondary | ICD-10-CM | POA: Diagnosis not present

## 2014-08-07 DIAGNOSIS — I5030 Unspecified diastolic (congestive) heart failure: Secondary | ICD-10-CM | POA: Diagnosis not present

## 2014-08-07 HISTORY — PX: PERMANENT PACEMAKER GENERATOR CHANGE: SHX6022

## 2014-08-07 LAB — SURGICAL PCR SCREEN
MRSA, PCR: NEGATIVE
STAPHYLOCOCCUS AUREUS: NEGATIVE

## 2014-08-07 SURGERY — PERMANENT PACEMAKER GENERATOR CHANGE
Anesthesia: LOCAL

## 2014-08-07 MED ORDER — FENTANYL CITRATE 0.05 MG/ML IJ SOLN
INTRAMUSCULAR | Status: AC
  Filled 2014-08-07: qty 2

## 2014-08-07 MED ORDER — MUPIROCIN 2 % EX OINT
TOPICAL_OINTMENT | CUTANEOUS | Status: AC
  Administered 2014-08-07: 09:00:00
  Filled 2014-08-07: qty 22

## 2014-08-07 MED ORDER — LIDOCAINE HCL (PF) 1 % IJ SOLN
INTRAMUSCULAR | Status: AC
  Filled 2014-08-07: qty 30

## 2014-08-07 MED ORDER — MUPIROCIN 2 % EX OINT
1.0000 "application " | TOPICAL_OINTMENT | Freq: Once | CUTANEOUS | Status: DC
  Filled 2014-08-07: qty 22

## 2014-08-07 MED ORDER — ONDANSETRON HCL 4 MG/2ML IJ SOLN: 4.0000 mg | Freq: Four times a day (QID) | INTRAMUSCULAR | Status: DC | PRN

## 2014-08-07 MED ORDER — CHLORHEXIDINE GLUCONATE 4 % EX LIQD
60.0000 mL | Freq: Once | CUTANEOUS | Status: DC
  Filled 2014-08-07: qty 60

## 2014-08-07 MED ORDER — ACETAMINOPHEN 325 MG PO TABS: 325.0000 mg | ORAL_TABLET | ORAL | Status: DC | PRN

## 2014-08-07 MED ORDER — MIDAZOLAM HCL 5 MG/5ML IJ SOLN
INTRAMUSCULAR | Status: AC
  Filled 2014-08-07: qty 5

## 2014-08-07 NOTE — Discharge Instructions (Signed)
Pacemaker Battery Change °A pacemaker battery usually lasts 4 to 12 years. Once or twice per year, you will be asked to visit your health care provider to have a full evaluation of your pacemaker. When a battery needs to be replaced, the entire pacemaker is replaced so that you can benefit from new circuitry and any new features that have been added to pacemakers. Most often, this procedure is very simple because the leads are already in place.  °There are many things that affect how long a pacemaker battery will last, including:  °· The age of the pacemaker.   °· The number of leads (1, 2, or 3).   °· The pacemaker work load. If the pacemaker is helping the heart more often, the battery will not last as long as it would if the pacemaker did not need to help the heart.   °· Power (voltage) settings.  °LET YOUR HEALTH CARE PROVIDER KNOW ABOUT:  °· Any allergies you have.   °· All medicines you are taking, including vitamins, herbs, eye drops, creams, and over-the-counter medicines.   °· Previous problems you or members of your family have had with the use of anesthetics.   °· Any blood disorders you have.   °· Previous surgeries you have had, especially since your last pacemaker placement.   °· Medical conditions you have.   °· Possibility of pregnancy, if this applies. °· Symptoms of chest pain, trouble breathing, palpitations, light-headedness, or feelings of an abnormal or irregular heartbeat. °RISKS AND COMPLICATIONS  °Generally, this is a safe procedure. However, as with any procedure, problems can occur and include:  °· Bleeding.   °· Bruising of the skin around where the incision was made.   °· Pain at the incision site.   °· Pulling apart of the skin at the incision site.   °· Infection.   °· Allergic reaction to anesthetics or other medicines used during the procedure.   °People with diabetes may have a temporary increase in their blood sugar after any surgical procedure.  °BEFORE THE PROCEDURE  °· Wash all  of the skin around the area of the chest where the pacemaker is located.   °· Ask your health care provider for help with any medicine adjustments before the pacemaker is replaced.   °· Do not eat or drink anything after midnight on the night before the procedure or as directed by your health care provider. °· Ask your health care provider if you can take a sip of water with any approved medicines the morning of the procedure. °PROCEDURE  °· After giving medicine to numb the skin (local anesthetic), your health care provider will make a cut to reopen the pocket holding the pacemaker.   °· The old pacemaker will be disconnected from its leads.   °· The leads will be tested.   °· If needed, the leads will be replaced. If the leads are functioning properly, the new pacemaker may be connected to the existing leads. °· A heart monitor and the pacemaker programmer will be used to make sure that the new pacemaker is working properly. °· The incision site will then be closed. A dressing will be placed over the pacemaker site. The dressing will be removed 24-48 hours afterward. °AFTER THE PROCEDURE  °· You will be taken to a recovery area after the new pacemaker implant is completed. Your vital signs such as blood pressure, heart rate, breathing, and oxygen levels will be monitored. °· Your health care provider will tell you when you will need to next test your pacemaker or when to return to the office for follow-up   for removal of stitches. Document Released: 02/01/2007 Document Revised: 03/10/2014 Document Reviewed: 05/08/2013 [facility] Patient Information 2015 [place], [place]. This information is not intended to replace advice given to you by your health care provider. Make sure you discuss any questions you have with your health care provider.  Pacemaker Implantation, Care After Refer to this sheet over the next few weeks. These instructions provide you with information on caring for yourself after the procedure. Your  health care provider may also give you more specific instructions. Your treatment has been planned according to current medical practices, but problems sometimes occur. Call your health care provider if you have any problems or questions regarding your pacemaker.  WHAT TO EXPECT AFTER THE PROCEDURE  You may feel pain. Some pain is normal. It may last a few days.  A slight bump may be seen over the skin where the device was placed. Sometimes, it is possible to feel the device under the skin. This is normal.  In the months and years afterward, your health care provider will check the device, the leads, and the battery every few months. Eventually, when the battery is low, the device will be replaced. HOME CARE INSTRUCTIONS Medicines  Take medicines only as directed by your health care provider.  If you were prescribed an antibiotic medicine, finish it all even if you start to feel better.  Do not take any other medicines without asking your health care provider first. Some medicines, including certain painkillers, can cause bleeding in your stomach after surgery. Wound Care  Do not remove the bandage on your chest until directed to do so by your health care provider.  After your bandage is removed, you may see pieces of tape called skin adhesive strips over the area where the cut was made (incision site). Let them fall off on their own.  Check the incision site every day to make sure it is not infected, bleeding, or starting to pull apart.  Do not use lotions or ointments near the incision site unless directed to do so.  Keep the incision area clean and dry for 2-3 days after the procedure or as directed by your health care provider. It takes several weeks for the incision site to completely heal.  Do not take baths, swim, or use a hot tub until your health care provider approves. Activities  Try to walk a little every day. Exercising is important after this procedure. It is also  important to use your shoulder on the side of the pacemaker in daily tasks that do not require exaggerated motion.  Avoid sudden jerking, pulling, or chopping movements that pull your upper arm far away from your body for at least 6 weeks.  Do not lift your upper arm above your shoulders for at least 6 weeks. This means no tennis, golf, or swimming for this period of time. If you sleep with the arm above your head, use a restraint to prevent this from happening as you sleep.  You may go back to work when your health care provider says it is okay. Check with your health care provider before you start to drive or play sports. Other Instructions  Follow diet instructions if they were provided. You should be able to eat what you usually do right away, but you may need to limit your salt intake.  Weigh yourself every day. If you suddenly gain weight, fluid may be building up in your body.  Always carry your pacemaker identification card with you. The card  should list the implant date, device model, and manufacturer. Consider wearing a medical alert bracelet or necklace.  Tell all health care providers that you have a pacemaker. This may prevent them from giving you a magnetic resource imaging scan (MRI) because of the strong magnets used during that test.  If you must pass through a metal detector, quickly walk through it. Do not stop under the detector or stand near it.  Avoid places or objects with a strong electric or magnetic field, including:  Engineer, maintenance. When at the airport, let officials know you have a pacemaker. Your ID card will let you be checked in a way that is safe for you and that will not damage your pacemaker. Also, do not let a security person wave a magnetic wand near your pacemaker. That can make it stop working.  Power plants.  Large electrical generators.  Radiofrequency transmission towers, such as cell phone and radio towers.  Do not use amateur (ham) radio  equipment or electric (arc) welding torches. Some devices are safe to use if held at least 1 foot from your pacemaker. These include power tools, lawn mowers, and speakers. If you are unsure of whether something is safe to use, ask your health care provider.  You may safely use electric blankets, heating pads, computers, and microwave ovens.  When using your cell phone, hold it to the ear opposite the pacemaker. Do not leave your cell phone in a pocket over the pacemaker.  Keep all follow-up visits as directed by your health care provider. This is how your health care provider makes sure your chest is healing the way it should. Ask your health care provider when you should come back to have your stitches or staples taken out.  Have your pacemaker checked every 3-6 months or as directed by your health care provider. Most pacemakers last for 4-8 years before a new one is needed. SEEK MEDICAL CARE IF:  You gain weight suddenly.  Your legs or feet swell more than they have before.  It feels like your heart is fluttering or skipping beats (heart palpitations).  You have a fever. SEEK IMMEDIATE MEDICAL CARE IF:  You have chest pain.  You feel more short of breath than you have felt before.  You feel more light-headed than you have felt before.  You have problems with your incision site, such as swelling or bleeding, or it starts to open up.  You have drainage, redness, swelling, or pain at your incision site. Document Released: 05/13/2005 Document Revised: 03/10/2014 Document Reviewed: 02/24/2012 [facility] Patient Information 2015 [place], [place]. This information is not intended to replace advice given to you by your health care provider. Make sure you discuss any questions you have with your health care provider.

## 2014-08-07 NOTE — Progress Notes (Addendum)
IV removed from right forearm tip intact, site WNL.  Discharge instructions discussed and reviewed with pt and daughter both verbalize understanding.

## 2014-08-07 NOTE — Interval H&P Note (Signed)
History and Physical Interval Note:  08/07/2014 7:34 AM  Erin [name]  has presented today for surgery, with the diagnosis of ERI/HB  The various methods of treatment have been discussed with the patient and family. After consideration of risks, benefits and other options for treatment, the patient has consented to  Procedure(s): PERMANENT PACEMAKER GENERATOR CHANGE (N/A) as a surgical intervention .  The patient's history has been reviewed, patient examined, no change in status, stable for surgery.  I have reviewed the patient's chart and labs.  Questions were answered to the patient's satisfaction.     Mikle Bosworth.D.

## 2014-08-07 NOTE — CV Procedure (Signed)
EP Procedure Note  Procedure: Removal of a previously implanted DDD PM which had reached ERI and insertion of a new DDD PM  Preoperative Diagnosis: Complete heart block, s/p PPM insertion with the old device at [facility]  Postoperative Diagnosis: same as preop diagnosis  Description of the Procedure: After informed consent was obtained, the patient was taken to the EP lab in the fasting state. After the usual preparation and draping, 30 cc of lidocaine was infiltrated into the right infraclavicular region. A 5 cm incision was made and electrocautery utilized to dissect down to the PPM pocket. The generator was removed with gentle traction. The old leads were evaluated and found to be working satisfactorily. The Medtronic Arieona Swaggerty DDD PM [id number]) was connected to the old atrial and ventricular leads and placed back into the subcutaneous pocket. The pocket was irrigated and the incision was closed with 2 layers of vicryl suture. Benzoin and steri-strips were painted on the skin and a bandage placed.  Complications: none immediately  Conclusion: successful removal of a DDD PM which had reached ERI in a patient with CHB, and insertion of a new DDD PM without immediate complication.  Mikle Bosworth.D.

## 2014-08-07 NOTE — H&P (View-Only) (Signed)
HPI Erin [name] is a very pleasant 80 year old woman with a history of complete heart block, status post permanent pacemaker insertion, hypertension, and dyslipidemia. In the interim, she has been stable. She denies chest pain, or shortness of breath. No syncope or peripheral edema. No cough or hemoptysis. She has had intermittant right leg swelling.  Allergies  Allergen Reactions  . Toprol Xl [Metoprolol Succinate]      Current Outpatient Prescriptions  Medication Sig Dispense Refill  . aspirin 325 MG tablet Take 325 mg by mouth daily.      . Calcium Carb-Cholecalciferol (CALCIUM 1000 + D PO) Take 2 tablets by mouth daily.      . calcium carbonate (TUMS EX) 750 MG chewable tablet Chew 2 tablets by mouth daily.       . chlordiazePOXIDE (LIBRIUM) 10 MG capsule Take 10 mg by mouth daily.      . Coenzyme Q10 (COQ-10) 400 MG CAPS Take 1 tablet by mouth daily.      Marland Kitchen CRANBERRY FRUIT PO Take 2,000 mcg by mouth 4 (four) times daily.      . fish oil-omega-3 fatty acids 1000 MG capsule Take 2 g by mouth daily.      . furosemide (LASIX) 20 MG tablet TAKE 1 TABLET (20 MG TOTAL) BY MOUTH DAILY.  90 tablet  1  . Green Tea, Camillia sinensis, ([employer] GREEN TEA EXTRACT PO) Take by mouth 4 (four) times daily.      Marland Kitchen levothyroxine (SYNTHROID, LEVOTHROID) 75 MCG tablet Take 75 mcg by mouth daily.      . Misc Natural Products (OSTEO BI-FLEX ADV JOINT SHIELD PO) Take 1 tablet by mouth daily.      . Multiple Vitamins-Minerals (CENTRUM SPECIALIST HEART PO) Take 2 tablets by mouth daily.      . simvastatin (ZOCOR) 40 MG tablet TAKE 1 TABLET BY MOUTH DAILY  90 tablet  3   No current facility-administered medications for this visit.     Past Medical History  Diagnosis Date  . Carotid artery plaque   . BBB (bundle branch block)   . HTN (hypertension)   . Hypothyroidism   . Breast cancer   . Hyperlipidemia   . DVT (deep venous thrombosis)   . Anxiety   . Diastolic CHF   . H/O: hysterectomy     ROS:   All systems reviewed and negative except as noted in the HPI.   Past Surgical History  Procedure Laterality Date  . Mastectomy    . Cataracts    . Pacemaker insertion    . Carpal tunnel release    . Cardiac catheterization  2007     Family History  Problem Relation Age of Onset  . Unexplained death Mother 80  . Unexplained death Father 72     History   Social History  . Marital Status: Widowed    Spouse Name: N/A    Number of Children: N/A  . Years of Education: N/A   Occupational History  . Not on file.   Social History Main Topics  . Smoking status: Never Smoker   . Smokeless tobacco: Never Used  . Alcohol Use: No  . Drug Use: No  . Sexual Activity: Not on file   Other Topics Concern  . Not on file   Social History Narrative   Patient is a widow.    Patient lives alone   Patient has 2 adopted children   Patient is retired   Patient has a high school education.  BP 104/70  Pulse 63  Ht 4\' 10"  (1.473 m)  Wt 93 lb 12.8 oz (42.547 kg)  BMI 19.61 kg/m2  Physical Exam:  Frail, but Well appearing elderly woman,NAD HEENT: Unremarkable Neck:  No JVD, no thyromegally Lungs:  Clear with no wheezes, rales, or rhonchi. HEART:  Regular rate rhythm, no murmurs, no rubs, no clicks Abd:  soft, positive bowel sounds, no organomegally, no rebound, no guarding Ext:  2 plus pulses, no edema, no cyanosis, no clubbing Skin:  No rashes no nodules Neuro:  CN II through XII intact, motor grossly intact  DEVICE  Normal device function.  See PaceArt for details. She is at [facility]  Assess/Plan:

## 2014-08-08 ENCOUNTER — Encounter: Payer: Self-pay | Admitting: Internal Medicine

## 2014-08-08 HISTORY — PX: PACEMAKER GENERATOR CHANGE: SHX5998

## 2014-08-18 ENCOUNTER — Ambulatory Visit (INDEPENDENT_AMBULATORY_CARE_PROVIDER_SITE_OTHER): Payer: Medicare Other | Admitting: *Deleted

## 2014-08-18 DIAGNOSIS — I442 Atrioventricular block, complete: Secondary | ICD-10-CM

## 2014-08-18 LAB — MDC_IDC_ENUM_SESS_TYPE_INCLINIC
Battery Impedance: 100 Ohm
Battery Remaining Longevity: 139 mo
Battery Voltage: 2.79 V
Brady Statistic AP VP Percent: 3 %
Brady Statistic AP VS Percent: 0 %
Brady Statistic AS VP Percent: 97 %
Brady Statistic AS VS Percent: 0 %
Date Time Interrogation Session: 20151012162711
Lead Channel Impedance Value: 408 Ohm
Lead Channel Impedance Value: 565 Ohm
Lead Channel Pacing Threshold Amplitude: 0.5 V
Lead Channel Pacing Threshold Pulse Width: 0.4 ms
Lead Channel Pacing Threshold Pulse Width: 0.4 ms
Lead Channel Setting Pacing Amplitude: 1.5 V
Lead Channel Setting Pacing Amplitude: 2 V
Lead Channel Setting Pacing Pulse Width: 0.4 ms
Lead Channel Setting Sensing Sensitivity: 4 mV
MDC IDC MSMT LEADCHNL RA SENSING INTR AMPL: 2.8 mV
MDC IDC MSMT LEADCHNL RV PACING THRESHOLD AMPLITUDE: 1 V

## 2014-08-18 NOTE — Progress Notes (Signed)

## 2014-09-01 ENCOUNTER — Ambulatory Visit: Payer: Medicare Other | Admitting: Neurology

## 2014-09-02 ENCOUNTER — Ambulatory Visit: Payer: Medicare Other | Admitting: Neurology

## 2014-09-02 ENCOUNTER — Encounter: Payer: Self-pay | Admitting: Neurology

## 2014-09-02 ENCOUNTER — Ambulatory Visit (INDEPENDENT_AMBULATORY_CARE_PROVIDER_SITE_OTHER): Payer: Medicare Other | Admitting: Neurology

## 2014-09-02 VITALS — BP 147/69 | HR 93 | Ht <= 58 in | Wt 93.0 lb

## 2014-09-02 DIAGNOSIS — I639 Cerebral infarction, unspecified: Secondary | ICD-10-CM

## 2014-09-02 DIAGNOSIS — F068 Other specified mental disorders due to known physiological condition: Secondary | ICD-10-CM

## 2014-09-02 DIAGNOSIS — R413 Other amnesia: Secondary | ICD-10-CM | POA: Insufficient documentation

## 2014-09-02 DIAGNOSIS — F09 Unspecified mental disorder due to known physiological condition: Secondary | ICD-10-CM

## 2014-09-02 NOTE — Progress Notes (Signed)
[facility]  PATIENT: Erin [name] DOB: 1920-06-21   REASON FOR VISIT: Followup for previous stroke   HISTORY OF PRESENT ILLNESS: Erin [name], 80 year old female returns for followup. She was last seen 01/08/2013. She continues to live independently with life alert. She no longer drives. He has not had any recurrent stroke symptoms. Last carotid ultrasound 01/31/2012 , she has bilateral mild plaques without hemodynamically significant stenosis. She is currently on aspirin with minimal bruising he is very hard of hearing.    HISTORY: Erin [name] is a 24 year pleasant Caucasian lady with  admission to  [facility] for stroke on 06/01/09. She was admitted with sudden onset left hemiparesis but presented beyond time window for intervention. Initial Ct head was unremarkable. MRI could not be done due to having a pacemaker. CT angio showed 50-60%  subclavian stenosis but no significant carotid or intracranial stenosis. Rt corona radiata white matter infarct was noted on repeat CT head.Cholestrol was 233 with LDL 160.HbA1c was 5.7 She was sent to short term SNF and received  PT/OT and went home with home health and  finished out patient therapies and has done very well. She was changed from 81 to 325 mg Aspirin.   TODAY: 07/11/12: She  returns for followup after last visit on 01/22/29 in with Erin [name]..She continues to live independently and walks without assisitance. She now has life alert.  Denies dragging her left leg or problems with fine motor skills. She is tolerating her medicines without side effects. She has not had any recurrent stroke symptoms.  She  had carotid ultrasound done on 01/31/12  which shows bilateral mild plaques without hemodynamically significant stenosis. . No new neurological complaints.  01/08/13: Patient returns for followup after last visit with Dr. Leonie Man 07/21/12. She has life alert and lives independently. She does not drive. She has not had  recurrent stroke symptoms. She  had carotid ultrasound done on 01/31/12  which shows bilateral mild plaques without hemodynamically significant stenosis. She remains on asa with minimal bruising. Denies new neurologic complaints.   UPDATE 09/03/2014 :She returns for f/u after last visit 1 year ago accompanied today by her daughter. She lost her husband of 27 years whi died earlier this year and since then daughter from [place] has moved in with her and noticed mild cognitive decline and memory difficulties.She still mostly independent in her ADLs but is more forgetful now. She had her pacemaker chnaged recently.Shje ha snot had any diagnostic work up for memory loss. There are no behavoiural issues or gait balance or safety concerns noted by daughter.d f/u CUS done on 09/11/13 which I have reviewed and shows no significant stenosis.    REVIEW OF SYSTEMS: Full 14 system review of systems performed and notable only for:  Frequency of urination, incontinence,bladder urgency, memory loss, facial drooping and all other systems negative  ALLERGIES: Allergies  Allergen Reactions  . Toprol Xl [Metoprolol Succinate] Other (See Comments)    UNKNOWN     HOME MEDICATIONS: Outpatient Prescriptions Prior to Visit  Medication Sig Dispense Refill  . aspirin 325 MG tablet Take 325 mg by mouth daily.      . Calcium Carb-Cholecalciferol (CALCIUM 1000 + D PO) Take 2 tablets by mouth daily.      . calcium carbonate (TUMS EX) 750 MG chewable tablet Chew 2 tablets by mouth daily.       . chlordiazePOXIDE (LIBRIUM) 10 MG capsule Take 10 mg by mouth 2 (two) times daily.       Marland Kitchen  Coenzyme Q10 (COQ-10) 400 MG CAPS Take 400 tablets by mouth daily.       Marland Kitchen CRANBERRY FRUIT PO Take 2 tablets by mouth 2 (two) times daily.       . fish oil-omega-3 fatty acids 1000 MG capsule Take 2 g by mouth daily.      . furosemide (LASIX) 20 MG tablet Take 20 mg by mouth.      Erin [name] Tea, Camillia sinensis, ([employer]S GREEN TEA EXTRACT PO) Take 2  tablets by mouth 2 (two) times daily.       . Hypromellose (ARTIFICIAL TEARS OP) Place 1 drop into both eyes 2 (two) times daily.      Marland Kitchen levothyroxine (SYNTHROID, LEVOTHROID) 75 MCG tablet Take 75 mcg by mouth daily.      . Misc Natural Products (OSTEO BI-FLEX ADV JOINT SHIELD PO) Take 1 tablet by mouth daily.      . Multiple Vitamins-Minerals (CENTRUM SPECIALIST HEART PO) Take 2 tablets by mouth daily.      . simvastatin (ZOCOR) 40 MG tablet Take 40 mg by mouth daily.       No facility-administered medications prior to visit.    PAST MEDICAL HISTORY: Past Medical History  Diagnosis Date  . Carotid artery plaque   . BBB (bundle branch block)   . HTN (hypertension)   . Hypothyroidism   . Breast cancer   . Hyperlipidemia   . DVT (deep venous thrombosis)   . Anxiety   . Diastolic CHF   . H/O: hysterectomy     PAST SURGICAL HISTORY: Past Surgical History  Procedure Laterality Date  . Mastectomy    . Cataracts    . Pacemaker insertion    . Carpal tunnel release    . Cardiac catheterization  2007    FAMILY HISTORY: Family History  Problem Relation Age of Onset  . Unexplained death Mother 76  . Unexplained death Father 76    SOCIAL HISTORY: History   Social History  . Marital Status: Widowed    Spouse Name: N/A    Number of Children: N/A  . Years of Education: 12th   Occupational History  . Retired    Social History Main Topics  . Smoking status: Never Smoker   . Smokeless tobacco: Never Used  . Alcohol Use: No  . Drug Use: No  . Sexual Activity: Not on file   Other Topics Concern  . Not on file   Social History Narrative   Patient is a widow.    Patient lives with daughter.   Patient has 2 adopted children.   Patient is retired.   Patient is right handed.   Patient has a high school education.              PHYSICAL EXAM  Filed Vitals:   09/02/14 1504 09/02/14 1546 09/02/14 1831  BP: 147/69    Pulse: 68 80 93  Height: 4\' 10"  (1.473 m)      Weight: 93 lb (42.185 kg)     Body mass index is 19.44 kg/(m^2).  Generalized: frail elderly Caucasian lady, in no acute distress  Head: normocephalic and atraumatic,. Oropharynx benign  Neck: Supple, no carotid bruits  Cardiac: Regular rate rhythm, no murmur  Musculoskeletal: Severe kyphosis Neurological examination   Mentation: Alert oriented to time, place, history taking. Follows all commands speech and language fluent.MMSE 22/30 with deficits in orientation, recall and attention.Clock Drawing 3/4. AFT 4. Geriatric Depression scale 2.  Cranial nerve II-XII: Fundi not examined.  Pupils were equal round reactive to light extraocular movements were full, visual field were full on confrontational test. Facial sensation and strength were normal. Very hard of hearing  Uvula tongue midline. head turning and shoulder shrug and were normal and symmetric.Tongue protrusion into cheek strength was normal. Motor: normal bulk and tone, full strength in the BUE, BLE, fine finger movements normal, no pronator drift. No focal weakness Sensory: normal and symmetric to light touch, pinprick, and  vibration  Coordination: finger-nose-finger,  no dysmetria Reflexes: 1+ of her lower and symmetric  Gait and Station: Cautious gait gait, short strides, no assistive device. No difficulty with turns  DIAGNOSTIC DATA (LABS, IMAGING, TESTING) -None to review     ASSESSMENT AND PLAN  80 y.o. year old female  has a past medical history of Carotid artery plaque; BBB (bundle branch block); HTN (hypertension); Hypothyroidism; Breast cancer; Hyperlipidemia; DVT (deep venous thrombosis); Anxiety; Diastolic CHF; and H/O: hysterectomy. here followup for right subcortical infarct (06/01/09)due to small vessel disease. Risk factors of hypertension and hyperlipidemia.New memory and cognitive difficulties likely mild cognitive impairment. I had a long discussion with the patient and her daughter regarding her new complaints  of memory loss and cognitive difficulties which are likely to age related mild cognitive impairment versus early dementia. Check vitamin B12, TSH, RPR, EEG and CT scan. Trial of Aricept 1 mg daily for a month followed by 10 mg if tolerated. The patient however seems reluctant to try this at the present time and needs some time to think and let me know. Increase fish oil 2 capsules daily. Check screening carotid ultrasound. Continue aspirin for stroke prevention and strict control of hypertension with blood pressure goal below 130/91 lipids with LDL cholesterol goal below 100 mg percent. Return for followup in 3 months or call earlier if necessary Antony Contras, MD  [facility] [address], [place], [place] [address] [phone]

## 2014-09-02 NOTE — Patient Instructions (Signed)
I had a long discussion with the patient and her daughter regarding her new complaints of memory loss and cognitive difficulties which are likely to age related mild cognitive impairment versus early dementia. Check vitamin B12, TSH, RPR, EEG and CT scan. Trial of Aricept 1 mg daily for a month followed by 10 mg if tolerated. The patient however seems reluctant to try this at the present time and needs some time to think and let me know. Increase fish oil 2 capsules daily. Check screening carotid ultrasound. Continue aspirin for stroke prevention and strict control of hypertension with blood pressure goal below 130/91 lipids with LDL cholesterol goal below 100 mg percent. Return for followup in 3 months or call earlier if necessary Mild Neurocognitive Disorder Mild neurocognitive disorder (formerly known as mild cognitive impairment) is a mental disorder. It is a slight abnormal decrease in mental function. The areas of mental function affected may include memory, thought, communication, behavior, and completion of tasks. The decrease is noticeable and measurable but for the most part does not interfere with your daily activities. Mild neurocognitive disorder typically occurs in people older than 60 years but can occur earlier. It is not as serious as major neurocognitive disorder (formerly known as dementia) but may lead to a more serious neurocognitive disorder. However, in some cases the condition does not get worse. A few people with this disorder even improve. CAUSES  There are a number of different causes of mild neurocognitive disorder:   Brain disorders associated with abnormal protein deposits, such as Alzheimer's disease, Pick's disease, and Lewy body disease.  Brain disorders associated with abnormal movement, such as Parkinson's disease and Huntington's disease.  Diseases affecting blood vessels in the brain and resulting in mini-strokes.  Certain infections, such as human immunodeficiency  virus (HIV) infection.  Traumatic brain injury.  Other medical conditions such as brain tumors, underactive thyroid (hypothyroidism), and vitamin B12 deficiency.  Use of certain prescription medicine and "recreational" drugs. SYMPTOMS  Symptoms of mild neurocognitive disorder include:  Difficulty remembering. You may forget details of recent events, names, or phone numbers. You may forget important social events and appointments or repeatedly forget where you put your car keys.  Difficulty thinking and solving problems. You may have trouble with complex tasks such as paying bills or driving in unfamiliar locations.  Difficulty communicating. You may have trouble finding the right word, naming an object, forming a sentence that makes sense, or understanding what you read or hear.  Changes in your behavior or personality. You may lose interest in the things that you used to enjoy or withdraw from social situations. You may get angry more easily than usual. You may act before thinking. You may do things in public that you would not usually do. You may hear or see things that are not real (hallucinations). You may believe falsely that others are trying to hurt you (paranoia). DIAGNOSIS Mild neurocognitive disorder is diagnosed through an assessment by your health care provider. Your health care provider will ask you and your family, friends, or coworkers questions about your symptoms. He or she will ask how often the symptoms occur, how long they have been occurring, whether they are getting worse, and the effect they are having on your life. Your health care provider may refer you to a neurologist or mental health specialist for a detailed evaluation of your mental functions (neuropsychological testing).  To identify the cause of your mild neurocognitive disorder, your health care provider may:  Obtain a detailed  medical history.  Ask about alcohol and drug use, including prescription  medicine.  Perform a physical exam.  Order blood tests and brain imaging exams. TREATMENT  Mild neurocognitive disorder caused by infections, use of certain medicines or "recreational" drugs, and certain medical conditions may improve with treatment of the condition that is causing the disorder. Mild neurocognitive disorder resulting from other causes generally does not improve and may worsen. In these cases, the goal of treatment is to slow progression of the disorder and help you cope with the loss of mental function. Treatments in these cases include:   Medicine. Medicine helps mainly with memory loss and behavioral symptoms.   Talk therapy. Talk therapy provides education, emotional support, memory aids, and other ways of making up for decreases in mental function.   Lifestyle changes. These include regular exercise, a healthy diet (including essential omega-3 fatty acids), intellectual stimulation, and increased social interaction. Document Released: 06/26/2013 Document Revised: 03/10/2014 Document Reviewed: 06/26/2013 [facility] Patient Information 2015 [place], [place]. This information is not intended to replace advice given to you by your health care provider. Make sure you discuss any questions you have with your health care provider.

## 2014-09-03 LAB — RPR: SYPHILIS RPR SCR: NONREACTIVE

## 2014-09-03 LAB — TSH: TSH: 0.389 u[IU]/mL — ABNORMAL LOW (ref 0.450–4.500)

## 2014-09-03 LAB — VITAMIN B12: Vitamin B-12: >1999 pg/mL — ABNORMAL HIGH (ref 211–946)

## 2014-09-09 ENCOUNTER — Telehealth: Payer: Self-pay | Admitting: *Deleted

## 2014-09-09 NOTE — Telephone Encounter (Signed)
Called Dr Dahlia Bailiff office and spoke with Lynelle Smoke, informed her of patient's labs and faxed over lab results.

## 2014-09-09 NOTE — Telephone Encounter (Signed)
-----   Message from Simonne Maffucci, [place] sent at 09/08/2014  6:59 PM EST -----   ----- Message -----    From: Antony Contras, MD    Sent: 09/03/2014   6:54 PM      To: Simonne Maffucci, CMA  Kindly inform patient`s PCP who is not in epic about abnormal TSH and start treatment

## 2014-09-10 NOTE — Telephone Encounter (Signed)
Juliann Pulse from Dr. Dahlia Bailiff office returning call to [facility], please return call and advise.

## 2014-09-11 ENCOUNTER — Ambulatory Visit
Admission: RE | Admit: 2014-09-11 | Discharge: 2014-09-11 | Disposition: A | Discharge status: Home / Self Care | Payer: Medicare Other | Source: Ambulatory Visit | Attending: Neurology | Admitting: Neurology

## 2014-09-11 DIAGNOSIS — R413 Other amnesia: Secondary | ICD-10-CM

## 2014-09-11 DIAGNOSIS — I639 Cerebral infarction, unspecified: Secondary | ICD-10-CM

## 2014-09-11 NOTE — Telephone Encounter (Signed)
Called Juliann Pulse back and she had already left for the day, was informed that last 2 office notes were needed, information was faxed over to their office.

## 2014-09-11 NOTE — Telephone Encounter (Signed)
Called [place] back at [place], she wasn't available left message to return my call.

## 2014-09-11 NOTE — Telephone Encounter (Signed)
Juliann Pulse returning call to Charisse March, please call her back at [phone], she wants some information faxed over to her, fax number is [phone].

## 2014-09-12 ENCOUNTER — Encounter: Payer: Self-pay | Admitting: Internal Medicine

## 2014-09-14 ENCOUNTER — Encounter (HOSPITAL_COMMUNITY): Payer: Self-pay | Admitting: *Deleted

## 2014-09-15 ENCOUNTER — Other Ambulatory Visit: Payer: Medicare Other | Admitting: Radiology

## 2014-09-21 DIAGNOSIS — R413 Other amnesia: Secondary | ICD-10-CM

## 2014-09-22 ENCOUNTER — Other Ambulatory Visit: Payer: Self-pay | Admitting: Family Medicine

## 2014-09-22 ENCOUNTER — Ambulatory Visit
Admission: RE | Admit: 2014-09-22 | Discharge: 2014-09-22 | Disposition: A | Discharge status: Home / Self Care | Payer: Medicare Other | Source: Ambulatory Visit | Attending: Family Medicine | Admitting: Family Medicine

## 2014-09-22 DIAGNOSIS — M533 Sacrococcygeal disorders, not elsewhere classified: Secondary | ICD-10-CM

## 2014-09-25 ENCOUNTER — Ambulatory Visit (INDEPENDENT_AMBULATORY_CARE_PROVIDER_SITE_OTHER): Payer: Medicare Other | Admitting: Radiology

## 2014-09-25 DIAGNOSIS — R413 Other amnesia: Secondary | ICD-10-CM

## 2014-09-25 NOTE — Procedures (Signed)
    History:  Amando Ishikawa is a 80 year old patient with a history of cognitive decline. The patient also has history of cerebrovascular disease. She is being evaluated for these issues.  This is a routine EEG. No skull defects are noted. Medications include aspirin, calcium carbonate, Librium, coenzyme Q 10, Lasix, Synthroid, multivitamins, and Zocor.   EEG classification: Normal awake and asleep  Description of the recording: The background rhythms of this recording consists of a fairly well modulated medium amplitude background activity of 9 Hz. As the record progresses, the patient initially is in the waking state, but appears to enter the early stage II sleep during the recording, with rudimentary sleep spindles and vertex sharp wave activity seen. During the wakeful state, photic stimulation is performed, and this results in a bilateral and symmetric photic driving response. Hyperventilation was not perfomed. At no time during the recording does there appear to be evidence of spike or spike wave discharges or evidence of focal slowing. EKG monitor shows no evidence of cardiac rhythm abnormalities with a heart rate of 66.  Impression: This is a normal EEG recording in the waking and sleeping state. No evidence of ictal or interictal discharges were seen at any time during the recording.

## 2014-09-30 ENCOUNTER — Telehealth: Payer: Self-pay | Admitting: *Deleted

## 2014-09-30 NOTE — Telephone Encounter (Signed)
Patient returned my call, informed her of normal EEG results, patient verbalized understanding and had no further questions or concerns.

## 2014-09-30 NOTE — Telephone Encounter (Signed)
-----   Message from Antony Contras, MD sent at 09/30/2014  1:04 PM EST ----- Kindly inform patient that EEG is normal

## 2014-10-16 ENCOUNTER — Encounter (HOSPITAL_COMMUNITY): Payer: Self-pay | Admitting: Internal Medicine

## 2014-11-18 ENCOUNTER — Encounter: Payer: Self-pay | Admitting: Internal Medicine

## 2014-11-18 ENCOUNTER — Ambulatory Visit (INDEPENDENT_AMBULATORY_CARE_PROVIDER_SITE_OTHER): Payer: Medicare Other | Admitting: Internal Medicine

## 2014-11-18 VITALS — BP 148/62 | HR 62 | Ht <= 58 in | Wt 96.4 lb

## 2014-11-18 DIAGNOSIS — I442 Atrioventricular block, complete: Secondary | ICD-10-CM | POA: Diagnosis not present

## 2014-11-18 DIAGNOSIS — I10 Essential (primary) hypertension: Secondary | ICD-10-CM | POA: Diagnosis not present

## 2014-11-18 DIAGNOSIS — Z950 Presence of cardiac pacemaker: Secondary | ICD-10-CM | POA: Diagnosis not present

## 2014-11-18 DIAGNOSIS — E785 Hyperlipidemia, unspecified: Secondary | ICD-10-CM

## 2014-11-18 LAB — MDC_IDC_ENUM_SESS_TYPE_INCLINIC
Battery Impedance: 100 Ohm
Battery Remaining Longevity: 139 mo
Battery Voltage: 2.79 V
Date Time Interrogation Session: 20160112134004
Lead Channel Pacing Threshold Amplitude: 0.75 V
Lead Channel Pacing Threshold Pulse Width: 0.4 ms
Lead Channel Setting Pacing Amplitude: 2 V
Lead Channel Setting Pacing Amplitude: 2.5 V
Lead Channel Setting Pacing Pulse Width: 0.4 ms
Lead Channel Setting Sensing Sensitivity: 4 mV
MDC IDC MSMT LEADCHNL RA IMPEDANCE VALUE: 431 Ohm
MDC IDC MSMT LEADCHNL RA PACING THRESHOLD AMPLITUDE: 0.5 V
MDC IDC MSMT LEADCHNL RA PACING THRESHOLD PULSEWIDTH: 0.4 ms
MDC IDC MSMT LEADCHNL RA SENSING INTR AMPL: 2.8 mV
MDC IDC MSMT LEADCHNL RV IMPEDANCE VALUE: 588 Ohm
MDC IDC STAT BRADY AP VP PERCENT: 6 %
MDC IDC STAT BRADY AP VS PERCENT: 0 %
MDC IDC STAT BRADY AS VP PERCENT: 94 %
MDC IDC STAT BRADY AS VS PERCENT: 0 %

## 2014-11-18 MED ORDER — SIMVASTATIN 40 MG PO TABS: 20.0000 mg | ORAL_TABLET | Freq: Every day | ORAL | Status: DC

## 2014-11-18 NOTE — Progress Notes (Signed)
HPI Erin [name] is a very pleasant 81 year old woman with a history of complete heart block, status post permanent pacemaker insertion, hypertension, and dyslipidemia. In the interim, she has been stable. She denies chest pain, or shortness of breath. No syncope or peripheral edema. No cough or hemoptysis. She has had constipation while taking Relafen. Allergies  Allergen Reactions  . Toprol Xl [Metoprolol Succinate] Other (See Comments)    UNKNOWN      Current Outpatient Prescriptions  Medication Sig Dispense Refill  . acetaminophen (TYLENOL) 325 MG tablet Take 325 mg by mouth 2 (two) times daily.    Marland Kitchen aspirin 325 MG tablet Take 325 mg by mouth daily.    . Calcium Carb-Cholecalciferol (CALCIUM 1000 + D PO) Take 2 tablets by mouth daily.    . calcium carbonate (TUMS EX) 750 MG chewable tablet Chew 2 tablets by mouth daily.     . chlordiazePOXIDE (LIBRIUM) 10 MG capsule Take 10 mg by mouth daily.     Marland Kitchen CINNAMON PO Take 1,000 mg by mouth 2 (two) times daily.    . Coenzyme Q10 (COQ-10) 400 MG CAPS Take 400 tablets by mouth daily.     Marland Kitchen CRANBERRY FRUIT PO Take 2 tablets by mouth 2 (two) times daily.     . furosemide (LASIX) 20 MG tablet Take 20 mg by mouth daily.     Nyoka Cowden Tea, Camillia sinensis, ([employer] GREEN TEA EXTRACT PO) Take 2 tablets by mouth 2 (two) times daily.     . Hypromellose (ARTIFICIAL TEARS OP) Place 1 drop into both eyes 2 (two) times daily.    Marland Kitchen levothyroxine (SYNTHROID, LEVOTHROID) 50 MCG tablet Take 50 mcg by mouth daily before breakfast.    . Misc Natural Products (OSTEO BI-FLEX ADV JOINT SHIELD PO) Take 1 tablet by mouth daily.    . nabumetone (RELAFEN) 500 MG tablet Take 500 mg by mouth 2 (two) times daily. Take with meals    . Omega-3 Fatty Acids (FISH OIL PO) Take 2 capsules by mouth daily.    . simvastatin (ZOCOR) 40 MG tablet Take 0.5 tablets (20 mg total) by mouth daily. 30 tablet    No current facility-administered medications for this visit.     Past Medical  History  Diagnosis Date  . Carotid artery plaque   . BBB (bundle branch block)   . HTN (hypertension)   . Hypothyroidism   . Breast cancer   . Hyperlipidemia   . DVT (deep venous thrombosis)   . Anxiety   . Diastolic CHF   . H/O: hysterectomy     ROS:   All systems reviewed and negative except as noted in the HPI.   Past Surgical History  Procedure Laterality Date  . Mastectomy    . Cataracts    . Pacemaker insertion    . Carpal tunnel release    . Cardiac catheterization  2007  . Pacemaker generator change  08/08/2014    MDT Sensia dual chamber pacemaker generator change by Dr Lovena Le  . Permanent pacemaker generator change N/A 08/07/2014    Procedure: PERMANENT PACEMAKER GENERATOR CHANGE;  Surgeon: Evans Lance, MD;  Location: [facility] CATH LAB;  Service: Cardiovascular;  Laterality: N/A;     Family History  Problem Relation Age of Onset  . Unexplained death Mother 61  . Unexplained death Father 28     History   Social History  . Marital Status: Widowed    Spouse Name: N/A    Number of Children: N/A  .  Years of Education: 12th   Occupational History  . Retired    Social History Main Topics  . Smoking status: Never Smoker   . Smokeless tobacco: Never Used  . Alcohol Use: No  . Drug Use: No  . Sexual Activity: Not on file   Other Topics Concern  . Not on file   Social History Narrative   Patient is a widow.    Patient lives with daughter.   Patient has 2 adopted children.   Patient is retired.   Patient is right handed.   Patient has a high school education.              BP 148/62 mmHg  Pulse 62  Ht 4\' 10"  (1.473 m)  Wt 96 lb 6.4 oz (43.727 kg)  BMI 20.15 kg/m2  Physical Exam:  Frail, but Well appearing elderly woman,NAD HEENT: Unremarkable Neck:  No JVD, no thyromegally Lungs:  Clear with no wheezes, rales, or rhonchi. Well healed PM incision. HEART:  Regular rate rhythm, no murmurs, no rubs, no clicks Abd:  soft, positive bowel sounds,  no organomegally, no rebound, no guarding Ext:  2 plus pulses, no edema, no cyanosis, no clubbing Skin:  No rashes no nodules Neuro:  CN II through XII intact, motor grossly intact  DEVICE  Normal device function.  See PaceArt for details.   Assess/Plan:

## 2014-11-18 NOTE — Assessment & Plan Note (Signed)
Her blood pressure is slightly elevated today. She is encouraged to reduce her sodium intake. No change in meds, as I do not want her blood pressure too low as she is at risk of falling.

## 2014-11-18 NOTE — Assessment & Plan Note (Signed)
Her Medtronic DDD PM is working normally. Will recheck in several months. 

## 2014-11-18 NOTE — Assessment & Plan Note (Signed)
The patient is on fairly high dose statin therapy. I have asked her to reduce her dose of simvastatin to 20 mg daily.

## 2014-11-18 NOTE — Patient Instructions (Addendum)
Your physician wants you to follow-up in: 12 months with Dr Knox Saliva will receive a reminder letter in the mail two months in advance. If you don't receive a letter, please call our office to schedule the follow-up appointment.  Remote monitoring is used to monitor your Pacemaker of ICD from home. This monitoring reduces the number of office visits required to check your device to one time per year. It allows [place] to keep an eye on the functioning of your device to ensure it is working properly. You are scheduled for a device check from home on 02/17/15. You may send your transmission at any time that day. If you have a wireless device, the transmission will be sent automatically. After your physician reviews your transmission, you will receive a postcard with your next transmission date.   Your physician has recommended you make the following change in your medication:  1) Decrease Simvastatin to 1/2 tablet daily 20mg  daily

## 2014-11-26 ENCOUNTER — Encounter: Payer: Self-pay | Admitting: Neurology

## 2014-11-26 ENCOUNTER — Ambulatory Visit (INDEPENDENT_AMBULATORY_CARE_PROVIDER_SITE_OTHER): Payer: Medicare Other | Admitting: Neurology

## 2014-11-26 VITALS — BP 108/59 | HR 59 | Ht <= 58 in | Wt 95.6 lb

## 2014-11-26 DIAGNOSIS — G3184 Mild cognitive impairment, so stated: Secondary | ICD-10-CM

## 2014-11-26 NOTE — Patient Instructions (Signed)
I had a long discussion with the patient and her daughter and personally reviewed and discuss CT scan and carotid Doppler findings, lab results and answered questions. I advised her to follow-up with her primary physician for optimization of her thyroid medications. Continue fish oil every day and participate in cognitively challenging exercises. Continue aspirin for stroke prevention and strict control of hypertension with blood pressure goal below 130/90, lipids with LDL cholesterol goal below 100 mg percent. Return for follow-up in 6 months or call earlier if necessary

## 2014-11-26 NOTE — Progress Notes (Signed)
[facility]  PATIENT: Erin [name] DOB: 01/29/1920   REASON FOR VISIT: Followup for previous stroke   HISTORY OF PRESENT ILLNESS: Erin [name], 81 year old female returns for followup. She was last seen 01/08/2013. She continues to live independently with life alert. She no longer drives. He has not had any recurrent stroke symptoms. Last carotid ultrasound 01/31/2012 , she has bilateral mild plaques without hemodynamically significant stenosis. She is currently on aspirin with minimal bruising he is very hard of hearing.    HISTORY: Erin [name] is a 7 year pleasant Caucasian lady with  admission to  [facility] for stroke on 06/01/09. She was admitted with sudden onset left hemiparesis but presented beyond time window for intervention. Initial Ct head was unremarkable. MRI could not be done due to having a pacemaker. CT angio showed 50-60%  subclavian stenosis but no significant carotid or intracranial stenosis. Rt corona radiata white matter infarct was noted on repeat CT head.Cholestrol was 233 with LDL 160.HbA1c was 5.7 She was sent to short term SNF and received  PT/OT and went home with home health and  finished out patient therapies and has done very well. She was changed from 81 to 325 mg Aspirin.   TODAY: 07/11/12: She  returns for followup after last visit on 01/22/29 in with Erin [name]..She continues to live independently and walks without assisitance. She now has life alert.  Denies dragging Erin left leg or problems with fine motor skills. She is tolerating Erin medicines without side effects. She has not had any recurrent stroke symptoms.  She  had carotid ultrasound done on 01/31/12  which shows bilateral mild plaques without hemodynamically significant stenosis. . No new neurological complaints.  01/08/13: Patient returns for followup after last visit with Dr. Leonie [name] 07/21/12. She has life alert and lives independently. She does not drive. She has not had  recurrent stroke symptoms. She  had carotid ultrasound done on 01/31/12  which shows bilateral mild plaques without hemodynamically significant stenosis. She remains on asa with minimal bruising. Denies new neurologic complaints.   UPDATE 09/03/2014 :She returns for f/u after last visit 1 year ago accompanied today by Erin [name]. She lost Erin husband of 74 years whi died earlier this year and since then Erin [name] from [place] has moved in with Erin and noticed mild cognitive decline and memory difficulties.She still mostly independent in Erin ADLs but is more forgetful now. She had Erin pacemaker chnaged recently.Erin [name] ha snot had any diagnostic work up for memory loss. There are no behavoiural issues or gait balance or safety concerns noted by Erin [name].d f/u CUS done on 09/11/13 which I have reviewed and shows no significant stenosis. Update 11/26/2014 : She returns for follow-up after last visit 3 months ago. She is accompanied by Erin [name]. Patient has not moved in to live with one of Erin daughters. She states Erin memory difficulties are unchanged. She does take 2 fish  capsules daily. She is quite independent in activities of daily living and even though the Erin [name] lives with Erin she does not require a lot of assistance except for taking Erin medicines. She can walk independently and only occasionally uses a cane. She has not had any recent falls. She has not had any episodes of confusion, disorientation, agitation, delusions or hallucinations. She had a pacemaker placed last October by Dr. Crissie Sickles. I personally reviewed lab results and CT scan and carotid ultrasound done in October 2015 which show normal vitamin B12, RPR is negative,  TSH is slightly low at 0.38 but Synthroid dose has been adjusted by Erin primary physician. CT scan of the head shows mild changes of age-related generalized cerebral atrophy, chronic microvascular ischemia and a small remote age lacunar infarct in the right subcortical white  matter. I discussed these findings with the patient and Erin [name] and answered questions  REVIEW OF SYSTEMS: Full 14 system review of systems performed and notable only for:  Frequency of urination, incontinence,bladder urgency, memory loss,  and all other systems negative  ALLERGIES: Allergies  Allergen Reactions  . Toprol Xl [Metoprolol Succinate] Other (See Comments)    UNKNOWN     HOME MEDICATIONS: Outpatient Prescriptions Prior to Visit  Medication Sig Dispense Refill  . aspirin 325 MG tablet Take 325 mg by mouth daily.    . Calcium Carb-Cholecalciferol (CALCIUM 1000 + D PO) Take 1,000 Units by mouth daily.     . calcium carbonate (TUMS EX) 750 MG chewable tablet Chew 2 tablets by mouth daily.     . chlordiazePOXIDE (LIBRIUM) 10 MG capsule Take 10 mg by mouth daily.     Marland Kitchen CINNAMON PO Take 1,000 mg by mouth 2 (two) times daily.    . Coenzyme Q10 (COQ-10) 400 MG CAPS Take 400 tablets by mouth daily.     Marland Kitchen CRANBERRY FRUIT PO Take 2,000 mg by mouth 2 (two) times daily.     . furosemide (LASIX) 20 MG tablet Take 20 mg by mouth daily.     Nyoka Cowden Tea, Camillia sinensis, ([employer] GREEN TEA EXTRACT PO) Take 2 tablets by mouth 2 (two) times daily.     . Hypromellose (ARTIFICIAL TEARS OP) Place 1 drop into both eyes 2 (two) times daily.    Marland Kitchen levothyroxine (SYNTHROID, LEVOTHROID) 50 MCG tablet Take 50 mcg by mouth daily before breakfast.    . Misc Natural Products (OSTEO BI-FLEX ADV JOINT SHIELD PO) Take 1 tablet by mouth daily.    . Omega-3 Fatty Acids (FISH OIL PO) Take 2 capsules by mouth daily.    . simvastatin (ZOCOR) 40 MG tablet Take 0.5 tablets (20 mg total) by mouth daily. 30 tablet   . acetaminophen (TYLENOL) 325 MG tablet Take 325 mg by mouth 2 (two) times daily.    . nabumetone (RELAFEN) 500 MG tablet Take 500 mg by mouth 2 (two) times daily. Take with meals     No facility-administered medications prior to visit.    PAST MEDICAL HISTORY: Past Medical History  Diagnosis Date   . Carotid artery plaque   . BBB (bundle branch block)   . HTN (hypertension)   . Hypothyroidism   . Breast cancer   . Hyperlipidemia   . DVT (deep venous thrombosis)   . Anxiety   . Diastolic CHF   . H/O: hysterectomy     PAST SURGICAL HISTORY: Past Surgical History  Procedure Laterality Date  . Mastectomy    . Cataracts    . Pacemaker insertion    . Carpal tunnel release    . Cardiac catheterization  2007  . Pacemaker generator change  08/08/2014    MDT Sensia dual chamber pacemaker generator change by Dr Lovena Le  . Permanent pacemaker generator change N/A 08/07/2014    Procedure: PERMANENT PACEMAKER GENERATOR CHANGE;  Surgeon: Evans Lance, MD;  Location: [facility] CATH LAB;  Service: Cardiovascular;  Laterality: N/A;    FAMILY HISTORY: Family History  Problem Relation Age of Onset  . Unexplained death Mother 74  . Unexplained death Father 68  SOCIAL HISTORY: History   Social History  . Marital Status: Widowed    Spouse Name: N/A    Number of Children: N/A  . Years of Education: 12th   Occupational History  . Retired    Social History Main Topics  . Smoking status: Never Smoker   . Smokeless tobacco: Never Used  . Alcohol Use: No  . Drug Use: No  . Sexual Activity: Not on file   Other Topics Concern  . Not on file   Social History Narrative   Patient is a widow.    Patient lives with Erin [name].   Patient has 2 adopted children.   Patient is retired.   Patient is right handed.   Patient has a high school education.              PHYSICAL EXAM  Filed Vitals:   11/26/14 1415  BP: 108/59  Pulse: 59  Height: 4\' 10"  (1.473 m)  Weight: 95 lb 9.6 oz (43.364 kg)   Body mass index is 19.99 kg/(m^2).  Generalized: frail elderly Caucasian lady, in no acute distress  Head: normocephalic and atraumatic,. Oropharynx benign  Neck: Supple, no carotid bruits  Cardiac: Regular rate rhythm, no murmur  Musculoskeletal: Severe kyphosis Neurological  examination   Mentation: Alert oriented to time, place, history taking. Follows all commands speech and language fluent.MMSE 28/30 with deficits in  recall only..Clock Drawing 3/4. AFT 9. Geriatric Depression scale 4..  Cranial nerve II-XII: Fundi not examined. Pupils were equal round reactive to light extraocular movements were full, visual field were full on confrontational test. Facial sensation and strength were normal. Very hard of hearing  Uvula tongue midline. head turning and shoulder shrug and were normal and symmetric.Tongue protrusion into cheek strength was normal. Motor: normal bulk and tone, full strength in the BUE, BLE, fine finger movements normal, no pronator drift. No focal weakness Sensory: normal and symmetric to light touch, pinprick, and  vibration  Coordination: finger-nose-finger,  no dysmetria Reflexes: 1+ of Erin lower and symmetric  Gait and Station: Cautious gait gait, short strides, no assistive device. No difficulty with turns  DIAGNOSTIC DATA (LABS, IMAGING, TESTING) -None to review     ASSESSMENT AND PLAN  81 y.o. year old female  has a past medical history of Carotid artery plaque; BBB (bundle branch block); HTN (hypertension); Hypothyroidism; Breast cancer; Hyperlipidemia; DVT (deep venous thrombosis); Anxiety; Diastolic CHF; and H/O: hysterectomy. here followup for right subcortical infarct (06/01/09)due to small vessel disease. Risk factors of hypertension and hyperlipidemia.New memory and cognitive difficulties likely mild cognitive impairment.  Plan : I had a long discussion with the patient and Erin [name] and personally reviewed and discuss CT scan and carotid Doppler findings, lab results and answered questions. I advised Erin to follow-up with Erin primary physician for optimization of Erin thyroid medications. Continue fish oil every day and participate in cognitively challenging exercises. Continue aspirin for stroke prevention and strict control of  hypertension with blood pressure goal below 130/90, lipids with LDL cholesterol goal below 100 mg percent. Return for follow-up in 6 months or call earlier if necessary  Antony Contras, MD  [facility] [address], [place], [place] [address] [phone]

## 2014-11-27 ENCOUNTER — Encounter: Payer: Self-pay | Admitting: Internal Medicine

## 2014-12-01 ENCOUNTER — Telehealth: Payer: Self-pay | Admitting: Internal Medicine

## 2014-12-01 DIAGNOSIS — E785 Hyperlipidemia, unspecified: Secondary | ICD-10-CM

## 2014-12-01 MED ORDER — SIMVASTATIN 20 MG PO TABS: 20.0000 mg | ORAL_TABLET | Freq: Every day | ORAL | Status: DC

## 2014-12-01 NOTE — Telephone Encounter (Signed)
Spoke with patient and let her know to take 20mg  daily.  She is aware she will take 1/2 of a 40mg  tablet of her Simvastatin until she runs out then she will pick up new rx for the 20mg  tablets daily

## 2014-12-01 NOTE — Telephone Encounter (Signed)
New msg        Pt wants to know how many mg she should take of Cymbastatin?  States Dr. Lovena Le changed dosage and pharmacy is hasn't received new Prescription.  Please return pt call.

## 2014-12-03 ENCOUNTER — Other Ambulatory Visit: Payer: Self-pay | Admitting: Internal Medicine

## 2014-12-04 ENCOUNTER — Other Ambulatory Visit: Payer: Self-pay | Admitting: *Deleted

## 2014-12-04 DIAGNOSIS — E785 Hyperlipidemia, unspecified: Secondary | ICD-10-CM

## 2014-12-04 MED ORDER — SIMVASTATIN 20 MG PO TABS: 20.0000 mg | ORAL_TABLET | Freq: Every day | ORAL | Status: DC

## 2015-02-17 ENCOUNTER — Ambulatory Visit (INDEPENDENT_AMBULATORY_CARE_PROVIDER_SITE_OTHER): Payer: Medicare Other | Admitting: *Deleted

## 2015-02-17 DIAGNOSIS — I442 Atrioventricular block, complete: Secondary | ICD-10-CM

## 2015-02-17 NOTE — Progress Notes (Signed)
Remote pacemaker transmission.   

## 2015-02-22 LAB — MDC_IDC_ENUM_SESS_TYPE_REMOTE
Battery Impedance: 100 Ohm
Brady Statistic AP VP Percent: 1 %
Brady Statistic AS VP Percent: 99 %
Brady Statistic AS VS Percent: 0 %
Date Time Interrogation Session: 20160412115405
Lead Channel Impedance Value: 443 Ohm
Lead Channel Pacing Threshold Amplitude: 0.375 V
Lead Channel Pacing Threshold Amplitude: 0.625 V
Lead Channel Pacing Threshold Pulse Width: 0.4 ms
Lead Channel Sensing Intrinsic Amplitude: 2.8 mV
Lead Channel Setting Pacing Pulse Width: 0.4 ms
MDC IDC MSMT BATTERY REMAINING LONGEVITY: 126 mo
MDC IDC MSMT BATTERY VOLTAGE: 2.79 V
MDC IDC MSMT LEADCHNL RA PACING THRESHOLD PULSEWIDTH: 0.4 ms
MDC IDC MSMT LEADCHNL RV IMPEDANCE VALUE: 575 Ohm
MDC IDC SET LEADCHNL RA PACING AMPLITUDE: 2 V
MDC IDC SET LEADCHNL RV PACING AMPLITUDE: 2.5 V
MDC IDC SET LEADCHNL RV SENSING SENSITIVITY: 4 mV
MDC IDC STAT BRADY AP VS PERCENT: 0 %

## 2015-03-02 ENCOUNTER — Encounter: Payer: Self-pay | Admitting: Cardiology

## 2015-05-20 ENCOUNTER — Ambulatory Visit (INDEPENDENT_AMBULATORY_CARE_PROVIDER_SITE_OTHER): Payer: Medicare Other | Admitting: *Deleted

## 2015-05-20 DIAGNOSIS — I442 Atrioventricular block, complete: Secondary | ICD-10-CM

## 2015-05-20 NOTE — Progress Notes (Signed)
Remote pacemaker transmission.   

## 2015-05-26 LAB — CUP PACEART REMOTE DEVICE CHECK
Battery Remaining Longevity: 126 mo
Brady Statistic AP VP Percent: 1 %
Brady Statistic AP VS Percent: 0 %
Brady Statistic AS VS Percent: 0 %
Date Time Interrogation Session: 20160713125811
Lead Channel Impedance Value: 432 Ohm
Lead Channel Impedance Value: 588 Ohm
Lead Channel Pacing Threshold Amplitude: 0.375 V
Lead Channel Pacing Threshold Amplitude: 0.625 V
Lead Channel Sensing Intrinsic Amplitude: 2.8 mV
Lead Channel Setting Pacing Amplitude: 2.5 V
Lead Channel Setting Pacing Pulse Width: 0.4 ms
Lead Channel Setting Sensing Sensitivity: 4 mV
MDC IDC MSMT BATTERY IMPEDANCE: 100 Ohm
MDC IDC MSMT BATTERY VOLTAGE: 2.79 V
MDC IDC MSMT LEADCHNL RA PACING THRESHOLD PULSEWIDTH: 0.4 ms
MDC IDC MSMT LEADCHNL RV PACING THRESHOLD PULSEWIDTH: 0.4 ms
MDC IDC SET LEADCHNL RA PACING AMPLITUDE: 2 V
MDC IDC STAT BRADY AS VP PERCENT: 99 %

## 2015-05-27 ENCOUNTER — Ambulatory Visit: Payer: Medicare Other | Admitting: Neurology

## 2015-05-27 DIAGNOSIS — Z961 Presence of intraocular lens: Secondary | ICD-10-CM | POA: Insufficient documentation

## 2015-05-27 DIAGNOSIS — D3141 Benign neoplasm of right ciliary body: Secondary | ICD-10-CM | POA: Insufficient documentation

## 2015-06-09 ENCOUNTER — Ambulatory Visit (INDEPENDENT_AMBULATORY_CARE_PROVIDER_SITE_OTHER): Payer: Medicare Other | Admitting: Neurology

## 2015-06-09 ENCOUNTER — Encounter: Payer: Self-pay | Admitting: Neurology

## 2015-06-09 VITALS — BP 111/60 | HR 59 | Ht <= 58 in | Wt 95.4 lb

## 2015-06-09 DIAGNOSIS — G3184 Mild cognitive impairment, so stated: Secondary | ICD-10-CM

## 2015-06-09 NOTE — Patient Instructions (Signed)
I had a long discussion with the patient and daughter regarding her mild memory and cognitive difficulties which appear to be fairly stable. I encouraged her to continue fish oil as well as parts patent cognitively challenging exercises like solving crossword puzzles, sudoku. She was advised to continue on aspirin for stroke prevention and maintain strict control of hypertension with blood pressure goal below 130/70 and lipids with LDL cholesterol goal below 70 mg percent. Greater than 50% of this 25 minute visit was spent on counseling and coordination of care. Return for follow-up in 6 months with Ward Givens is practitioner call earlier if necessary. Memory Compensation Strategies  1. Use "WARM" strategy.  W= write it down  A= associate it  R= repeat it  M= make a mental note  2.   You can keep a Social worker.  Use a 3-ring notebook with sections for the following: calendar, important names and phone numbers,  medications, doctors' names/phone numbers, lists/reminders, and a section to journal what you did  each day.   3.    Use a calendar to write appointments down.  4.    Write yourself a schedule for the day.  This can be placed on the calendar or in a separate section of the Memory Notebook.  Keeping a  regular schedule can help memory.  5.    Use medication organizer with sections for each day or morning/evening pills.  You may need help loading it  6.    Keep a basket, or pegboard by the door.  Place items that you need to take out with you in the basket or on the pegboard.  You may also want to  include a message board for reminders.  7.    Use sticky notes.  Place sticky notes with reminders in a place where the task is performed.  For example: " turn off the  stove" placed by the stove, "lock the door" placed on the door at eye level, " take your medications" on  the bathroom mirror or by the place where you normally take your medications.  8.    Use alarms/timers.  Use  while cooking to remind yourself to check on food or as a reminder to take your medicine, or as a  reminder to make a call, or as a reminder to perform another task, etc.

## 2015-06-09 NOTE — Progress Notes (Signed)
[facility]  PATIENT: Erin [name] DOB: 1920/01/24   REASON FOR VISIT: Followup for previous stroke   HISTORY OF PRESENT ILLNESS: Erin [name], 81 year old female returns for followup. She was last seen 01/08/2013. She continues to live independently with life alert. She no longer drives. He has not had any recurrent stroke symptoms. Last carotid ultrasound 01/31/2012 , she has bilateral mild plaques without hemodynamically significant stenosis. She is currently on aspirin with minimal bruising he is very hard of hearing.    HISTORY: Erin [name] is a 7 year pleasant Caucasian lady with  admission to  [facility] for stroke on 06/01/09. She was admitted with sudden onset left hemiparesis but presented beyond time window for intervention. Initial Ct head was unremarkable. MRI could not be done due to having a pacemaker. CT angio showed 50-60%  subclavian stenosis but no significant carotid or intracranial stenosis. Rt corona radiata white matter infarct was noted on repeat CT head.Cholestrol was 233 with LDL 160.HbA1c was 5.7 She was sent to short term SNF and received  PT/OT and went home with home health and  finished out patient therapies and has done very well. She was changed from 81 to 325 mg Aspirin.   TODAY: 07/11/12: She  returns for followup after last visit on 01/22/29 in with Erin [name]..She continues to live independently and walks without assisitance. She now has life alert.  Denies dragging her left leg or problems with fine motor skills. She is tolerating her medicines without side effects. She has not had any recurrent stroke symptoms.  She  had carotid ultrasound done on 01/31/12  which shows bilateral mild plaques without hemodynamically significant stenosis. . No new neurological complaints.  01/08/13: Patient returns for followup after last visit with Dr. Leonie Man 07/21/12. She has life alert and lives independently. She does not drive. She has not had  recurrent stroke symptoms. She  had carotid ultrasound done on 01/31/12  which shows bilateral mild plaques without hemodynamically significant stenosis. She remains on asa with minimal bruising. Denies new neurologic complaints.   UPDATE 09/03/2014 :She returns for f/u after last visit 1 year ago accompanied today by her daughter. She lost her husband of 48 years whi died earlier this year and since then daughter from [place] has moved in with her and noticed mild cognitive decline and memory difficulties.She still mostly independent in her ADLs but is more forgetful now. She had her pacemaker chnaged recently.Erin [name] ha snot had any diagnostic work up for memory loss. There are no behavoiural issues or gait balance or safety concerns noted by daughter.d f/u CUS done on 09/11/13 which I have reviewed and shows no significant stenosis. Update 11/26/2014 : She returns for follow-up after last visit 3 months ago. She is accompanied by her daughter. Patient has not moved in to live with one of her daughters. She states her memory difficulties are unchanged. She does take 2 fish  capsules daily. She is quite independent in activities of daily living and even though the daughter lives with her she does not require a lot of assistance except for taking her medicines. She can walk independently and only occasionally uses a cane. She has not had any recent falls. She has not had any episodes of confusion, disorientation, agitation, delusions or hallucinations. She had a pacemaker placed last October by Dr. Crissie Sickles. I personally reviewed lab results and CT scan and carotid ultrasound done in October 2015 which show normal vitamin B12, RPR is negative,  TSH is slightly low at 0.38 but Synthroid dose has been adjusted by her primary physician. CT scan of the head shows mild changes of age-related generalized cerebral atrophy, chronic microvascular ischemia and a small remote age lacunar infarct in the right subcortical white  matter. I discussed these findings with the patient and her daughter and answered questions Update 06/09/2015 : She returns for follow-up after last visit 6 months ago. She continues to have mild short-term memory difficulties which appear to have slightly decline. She gets occasional confusion. She had her pacemaker replaced in October. She also had her thyroid medication dose changed. She has not been participating in activities for cognitively challenge  REVIEW OF SYSTEMS: Full 14 system review of systems performed and notable only for:   Hearing loss, eye redness, frequency urination, walking difficulty, memory loss, facial drooping, anxiety, nervousness  and all other systems negative  ALLERGIES: Allergies  Allergen Reactions  . Toprol Xl [Metoprolol Succinate] Other (See Comments)    UNKNOWN     HOME MEDICATIONS: Outpatient Prescriptions Prior to Visit  Medication Sig Dispense Refill  . aspirin 325 MG tablet Take 325 mg by mouth daily.    . Calcium Carb-Cholecalciferol (CALCIUM 1000 + D PO) Take 1,000 Units by mouth daily.     . calcium carbonate (TUMS EX) 750 MG chewable tablet Chew 2 tablets by mouth daily.     . chlordiazePOXIDE (LIBRIUM) 10 MG capsule Take 10 mg by mouth daily.     Marland Kitchen CINNAMON PO Take 1,000 mg by mouth 2 (two) times daily.    . Coenzyme Q10 (COQ-10) 400 MG CAPS Take 400 tablets by mouth daily.     Marland Kitchen CRANBERRY FRUIT PO Take 2,000 mg by mouth 2 (two) times daily.     . furosemide (LASIX) 20 MG tablet TAKE 1 TABLET (20 MG TOTAL) BY MOUTH DAILY. 90 tablet 3  . Green Tea, Camillia sinensis, ([employer] GREEN TEA EXTRACT PO) Take 2 tablets by mouth 2 (two) times daily.     . Hypromellose (ARTIFICIAL TEARS OP) Place 1 drop into both eyes 2 (two) times daily.    Marland Kitchen levothyroxine (SYNTHROID, LEVOTHROID) 50 MCG tablet Take 50 mcg by mouth daily before breakfast.    . Misc Natural Products (OSTEO BI-FLEX ADV JOINT SHIELD PO) Take 1 tablet by mouth daily.    . Omega-3 Fatty Acids  (FISH OIL PO) Take 2 capsules by mouth daily.    . simvastatin (ZOCOR) 20 MG tablet Take 1 tablet (20 mg total) by mouth daily. 90 tablet 3   No facility-administered medications prior to visit.    PAST MEDICAL HISTORY: Past Medical History  Diagnosis Date  . Carotid artery plaque   . BBB (bundle branch block)   . HTN (hypertension)   . Hypothyroidism   . Breast cancer   . Hyperlipidemia   . DVT (deep venous thrombosis)   . Anxiety   . Diastolic CHF   . H/O: hysterectomy     PAST SURGICAL HISTORY: Past Surgical History  Procedure Laterality Date  . Mastectomy    . Cataracts    . Pacemaker insertion    . Carpal tunnel release    . Cardiac catheterization  2007  . Pacemaker generator change  08/08/2014    MDT Sensia dual chamber pacemaker generator change by Dr Lovena Le  . Permanent pacemaker generator change N/A 08/07/2014    Procedure: PERMANENT PACEMAKER GENERATOR CHANGE;  Surgeon: Evans Lance, MD;  Location: [facility] CATH LAB;  Service: Cardiovascular;  Laterality: N/A;    FAMILY HISTORY: Family History  Problem Relation Age of Onset  . Unexplained death Mother 72  . Unexplained death Father 69    SOCIAL HISTORY: History   Social History  . Marital Status: Widowed    Spouse Name: N/A  . Number of Children: N/A  . Years of Education: 12th   Occupational History  . Retired    Social History Main Topics  . Smoking status: Never Smoker   . Smokeless tobacco: Never Used  . Alcohol Use: No  . Drug Use: No  . Sexual Activity: Not on file   Other Topics Concern  . Not on file   Social History Narrative   Patient is a widow.    Patient lives with daughter.   Patient has 2 adopted children.   Patient is retired.   Patient is right handed.   Patient has a high school education.              PHYSICAL EXAM  Filed Vitals:   06/09/15 1457  BP: 111/60  Pulse: 59  Height: 4\' 9"  (1.448 m)  Weight: 95 lb 6.4 oz (43.273 kg)   Body mass index is 20.64  kg/(m^2).  Generalized: frail elderly Caucasian lady, in no acute distress  Head: normocephalic and atraumatic,.   Neck: Supple, no carotid bruits  Cardiac: Regular rate rhythm, no murmur  Musculoskeletal: Severe kyphosis Neurological examination   Mentation: Alert oriented to time, place, history taking. Follows all commands speech and language fluent.MMSE 27/30 with deficits in  recall only..Clock Drawing 4/4. AFT 3.    Cranial nerve II-XII: Fundi not examined. Pupils were equal round reactive to light extraocular movements were full, visual field were full on confrontational test. Facial sensation and strength were normal. Very hard of hearing  Uvula tongue midline. head turning and shoulder shrug and were normal and symmetric.Tongue protrusion into cheek strength was normal. Motor: normal bulk and tone, full strength in the BUE, BLE, fine finger movements normal, no pronator drift. No focal weakness Sensory: normal and symmetric to light touch, pinprick, and  vibration  Coordination: finger-nose-finger,  no dysmetria Reflexes: 1+ of her lower and symmetric  Gait and Station: Cautious gait gait, short strides, no assistive device. No difficulty with turns  DIAGNOSTIC DATA (LABS, IMAGING, TESTING) -None to review     ASSESSMENT AND PLAN  81 y.o. year old female  has a past medical history of Carotid artery plaque; BBB (bundle branch block); HTN (hypertension); Hypothyroidism; Breast cancer; Hyperlipidemia; DVT (deep venous thrombosis); Anxiety; Diastolic CHF; and H/O: hysterectomy. here followup for right subcortical infarct (06/01/09)due to small vessel disease. Risk factors of hypertension and hyperlipidemia.Mild memory and cognitive difficulties likely mild cognitive impairment.  Plan :I had a long discussion with the patient and daughter regarding her mild memory and cognitive difficulties which appear to be fairly stable. I encouraged her to continue fish oil as well as parts patent  cognitively challenging exercises like solving crossword puzzles, sudoku. She was advised to continue on aspirin for stroke prevention and maintain strict control of hypertension with blood pressure goal below 130/70 and lipids with LDL cholesterol goal below 70 mg percent. Greater than 50% of this 25 minute visit was spent on counseling and coordination of care. Return for follow-up in 6 months with Ward Givens is practitioner call earlier if necessary.  Antony Contras, MD  [facility] [address] [place], [place] [address] [phone]

## 2015-06-12 ENCOUNTER — Encounter: Payer: Self-pay | Admitting: Cardiology

## 2015-06-16 ENCOUNTER — Encounter: Payer: Self-pay | Admitting: Internal Medicine

## 2015-08-05 ENCOUNTER — Other Ambulatory Visit: Payer: Self-pay | Admitting: Otolaryngology

## 2015-08-05 DIAGNOSIS — J329 Chronic sinusitis, unspecified: Secondary | ICD-10-CM

## 2015-08-06 ENCOUNTER — Other Ambulatory Visit: Payer: Self-pay | Admitting: Family Medicine

## 2015-08-06 ENCOUNTER — Ambulatory Visit
Admission: RE | Admit: 2015-08-06 | Discharge: 2015-08-06 | Disposition: A | Discharge status: Home / Self Care | Payer: Medicare Other | Source: Ambulatory Visit | Attending: Family Medicine | Admitting: Family Medicine

## 2015-08-06 DIAGNOSIS — W19XXXA Unspecified fall, initial encounter: Secondary | ICD-10-CM

## 2015-08-10 ENCOUNTER — Ambulatory Visit
Admission: RE | Admit: 2015-08-10 | Discharge: 2015-08-10 | Disposition: A | Discharge status: Home / Self Care | Payer: Medicare Other | Source: Ambulatory Visit | Attending: Otolaryngology | Admitting: Otolaryngology

## 2015-08-10 DIAGNOSIS — J329 Chronic sinusitis, unspecified: Secondary | ICD-10-CM

## 2015-08-25 ENCOUNTER — Other Ambulatory Visit: Payer: Self-pay

## 2015-08-25 MED ORDER — FUROSEMIDE 20 MG PO TABS: 20.0000 mg | ORAL_TABLET | Freq: Every day | ORAL | Status: DC

## 2015-08-26 ENCOUNTER — Ambulatory Visit (INDEPENDENT_AMBULATORY_CARE_PROVIDER_SITE_OTHER): Payer: Medicare Other | Admitting: *Deleted

## 2015-08-26 DIAGNOSIS — I442 Atrioventricular block, complete: Secondary | ICD-10-CM

## 2015-08-27 NOTE — Progress Notes (Signed)
REMOTE CHECK

## 2015-08-27 NOTE — Progress Notes (Deleted)
LOOP RECORDER  

## 2015-08-31 ENCOUNTER — Encounter: Payer: Self-pay | Admitting: Cardiology

## 2015-08-31 LAB — CUP PACEART REMOTE DEVICE CHECK
Battery Impedance: 104 Ohm
Battery Voltage: 2.79 V
Brady Statistic AP VP Percent: 2 %
Brady Statistic AP VS Percent: 0 %
Implantable Lead Implant Date: 20070316
Implantable Lead Location: 753859
Implantable Lead Location: 753860
Implantable Lead Model: 4469
Implantable Lead Serial Number: 478729
Implantable Lead Serial Number: 523845
Lead Channel Impedance Value: 455 Ohm
Lead Channel Pacing Threshold Pulse Width: 0.4 ms
Lead Channel Setting Pacing Amplitude: 2 V
Lead Channel Setting Sensing Sensitivity: 4 mV
MDC IDC LEAD IMPLANT DT: 20070316
MDC IDC MSMT BATTERY REMAINING LONGEVITY: 126 mo
MDC IDC MSMT LEADCHNL RA PACING THRESHOLD AMPLITUDE: 0.375 V
MDC IDC MSMT LEADCHNL RA SENSING INTR AMPL: 2.8 mV — AB
MDC IDC MSMT LEADCHNL RV IMPEDANCE VALUE: 601 Ohm
MDC IDC MSMT LEADCHNL RV PACING THRESHOLD AMPLITUDE: 0.625 V
MDC IDC MSMT LEADCHNL RV PACING THRESHOLD PULSEWIDTH: 0.4 ms
MDC IDC SESS DTM: 20161019160410
MDC IDC SET LEADCHNL RV PACING AMPLITUDE: 2.5 V
MDC IDC SET LEADCHNL RV PACING PULSEWIDTH: 0.4 ms
MDC IDC STAT BRADY AS VP PERCENT: 98 %
MDC IDC STAT BRADY AS VS PERCENT: 0 %

## 2015-10-12 ENCOUNTER — Ambulatory Visit (INDEPENDENT_AMBULATORY_CARE_PROVIDER_SITE_OTHER): Payer: Medicare Other | Admitting: Family Medicine

## 2015-10-12 ENCOUNTER — Other Ambulatory Visit: Payer: Self-pay | Admitting: Family Medicine

## 2015-10-12 ENCOUNTER — Ambulatory Visit (INDEPENDENT_AMBULATORY_CARE_PROVIDER_SITE_OTHER): Payer: Medicare Other

## 2015-10-12 VITALS — BP 122/64 | HR 69 | Temp 97.4°F | Resp 16 | Ht <= 58 in | Wt 97.8 lb

## 2015-10-12 DIAGNOSIS — M25462 Effusion, left knee: Secondary | ICD-10-CM

## 2015-10-12 DIAGNOSIS — M25562 Pain in left knee: Secondary | ICD-10-CM | POA: Diagnosis not present

## 2015-10-12 MED ORDER — DICLOFENAC SODIUM 1 % TD GEL: 2.0000 g | Freq: Four times a day (QID) | TRANSDERMAL | Status: DC

## 2015-10-12 NOTE — Patient Instructions (Signed)
The knee swelling and pain should gradually resolve over the next 1-2 months. It often takes this long for things to completely resolve

## 2015-10-12 NOTE — Progress Notes (Signed)
@[facility] @  By signing my name below, I, Erin [name], attest that this documentation has been prepared under the direction and in the presence of Erin Haber, MD.  Electronically Signed: [name], ED Scribe. 10/12/2015. 3:12 PM.  Patient ID: Erin [name] MRN: [id number], DOB: 08/13/1920, 81 y.o. Date of Encounter: 10/12/2015, 3:06 PM  Primary Physician: [name], MD  Chief Complaint:  Chief Complaint  Patient presents with   Fall    x 2 weeks ago   Knee Pain    fell on both her knees, both have pain but worst in the left knee    HPI: 81 y.o. year old female with history below presents with a fall 2 weeks ago. Pt ambulates with a walker and states while walking she tripped on walker and landed on bilateral knees. She now has improving pain and persistent swelling to left knee. She has some pain and difficulty straightening her left leg. She has iced knee once since injury and has been taking extra strength tylenol.    Past Medical History  Diagnosis Date   Carotid artery plaque    BBB (bundle branch block)    HTN (hypertension)    Hypothyroidism    Breast cancer (HCC)    Hyperlipidemia    DVT (deep venous thrombosis) (HCC)    Anxiety    Diastolic CHF ([place])    H/O: hysterectomy    Stroke ([place])      Home Meds: Prior to Admission medications   Medication Sig Start Date End Date Taking? Authorizing Provider  aspirin 325 MG tablet Take 325 mg by mouth daily.   Yes Historical Provider, MD  Calcium Carb-Cholecalciferol (CALCIUM 1000 + D PO) Take 1,000 Units by mouth daily.    Yes Historical Provider, MD  calcium carbonate (TUMS EX) 750 MG chewable tablet Chew 2 tablets by mouth daily.    Yes Historical Provider, MD  chlordiazePOXIDE (LIBRIUM) 10 MG capsule Take 10 mg by mouth daily.    Yes Historical Provider, MD  CINNAMON PO Take 1,000 mg by mouth 2 (two) times daily.   Yes Historical Provider, MD  Coenzyme Q10 (COQ-10) 400 MG CAPS Take 400  tablets by mouth daily.    Yes Historical Provider, MD  CRANBERRY FRUIT PO Take 2,000 mg by mouth 2 (two) times daily.    Yes Historical Provider, MD  Fish Oil-Cholecalciferol (FISH OIL + D3 PO) Take by mouth.   Yes Historical Provider, MD  furosemide (LASIX) 20 MG tablet Take 1 tablet (20 mg total) by mouth daily. 08/25/15  Yes Evans Lance, MD  Green Tea, Camillia sinensis, ([employer] GREEN TEA EXTRACT PO) Take 2 tablets by mouth 2 (two) times daily.    Yes Historical Provider, MD  Hypromellose (ARTIFICIAL TEARS OP) Place 1 drop into both eyes 2 (two) times daily.   Yes Historical Provider, MD  levothyroxine (SYNTHROID, LEVOTHROID) 50 MCG tablet Take 50 mcg by mouth daily before breakfast.   Yes Historical Provider, MD  levothyroxine (SYNTHROID, LEVOTHROID) 75 MCG tablet Take 75 mcg by mouth daily before breakfast.   Yes Historical Provider, MD  Misc Natural Products (OSTEO BI-FLEX ADV JOINT SHIELD PO) Take 1 tablet by mouth daily.   Yes Historical Provider, MD  Omega-3 Fatty Acids (FISH OIL PO) Take 2 capsules by mouth daily.   Yes Historical Provider, MD  simvastatin (ZOCOR) 20 MG tablet Take 1 tablet (20 mg total) by mouth daily. 12/04/14  Yes Evans Lance, MD    Allergies:  Allergies  Allergen Reactions   Toprol Xl [Metoprolol Succinate] Other (See Comments)    UNKNOWN     Social History   Social History   Marital Status: Widowed    Spouse Name: N/A   Number of Children: N/A   Years of Education: 12th   Occupational History   Retired    Social History Main Topics   Smoking status: Never Smoker    Smokeless tobacco: Never Used   Alcohol Use: No   Drug Use: No   Sexual Activity: Not on file   Other Topics Concern   Not on file   Social History Narrative   Patient is a widow.    Patient lives with daughter.   Patient has 2 adopted children.   Patient is retired.   Patient is right handed.   Patient has a high school education.              Review of  Systems: Constitutional: negative for chills, fever, night sweats, weight changes, or fatigue  HEENT: negative for vision changes, hearing loss, congestion, rhinorrhea, ST, epistaxis, or sinus pressure Cardiovascular: negative for chest pain or palpitations Respiratory: negative for hemoptysis, wheezing, shortness of breath, or cough Abdominal: negative for abdominal pain, nausea, vomiting, diarrhea, or constipation Dermatological: negative for rash Neurologic: negative for headache, dizziness, or syncope All other systems reviewed and are otherwise negative with the exception to those above and in the HPI.   Physical Exam: Blood pressure 122/64, pulse 69, temperature 97.4 F (36.3 C), temperature source Oral, resp. rate 16, height 4\' 9"  (1.448 m), weight 97 lb 12.8 oz (44.362 kg), SpO2 96 %., Body mass index is 21.16 kg/(m^2). General: Well developed, well nourished, in no acute distress. Head: Normocephalic, atraumatic, eyes without discharge, sclera non-icteric, nares are without discharge. Bilateral auditory canals clear, TM's are without perforation. Oral cavity moist Neck: Supple. No thyromegaly. Full ROM. No lymphadenopathy. Msk:  Strength and tone normal for age. Left knee is diffusely swollen and tender over patella. Reduce extension.  Diffuse calf ecchymosis.  Extremities/Skin: Warm and dry. No clubbing or cyanosis. No edema. No rashes or suspicious lesions. Neuro: Alert and oriented X 3. Moves all extremities spontaneously. Gait is normal. CNII-XII grossly in tact. Psych:  Responds to questions appropriately with a normal affect.   [facility] reading (PRIMARY) by Dr.Lauenstein. Left Knee: marked arthritis with no tissue swelling. No fx noted.   ASSESSMENT AND PLAN:  81 y.o. year old female with contused and sprained left knee This chart was scribed in my presence and reviewed by me personally.    ICD-9-CM ICD-10-CM   1. Left knee pain 719.46 M25.562 DG Knee Complete 4 Views Left      diclofenac sodium (VOLTAREN) 1 % GEL  2. Knee swelling, left 719.06 M25.462 DG Knee Complete 4 Views Left     diclofenac sodium (VOLTAREN) 1 % GEL     Signed, Erin Haber, MD 10/12/2015 3:06 PM

## 2015-11-24 ENCOUNTER — Other Ambulatory Visit: Payer: Self-pay | Admitting: Internal Medicine

## 2015-12-10 ENCOUNTER — Ambulatory Visit (INDEPENDENT_AMBULATORY_CARE_PROVIDER_SITE_OTHER): Payer: Medicare Other | Admitting: Adult Health

## 2015-12-10 ENCOUNTER — Encounter: Payer: Self-pay | Admitting: Adult Health

## 2015-12-10 VITALS — BP 128/62 | HR 74 | Ht <= 58 in | Wt 98.0 lb

## 2015-12-10 DIAGNOSIS — Z8673 Personal history of transient ischemic attack (TIA), and cerebral infarction without residual deficits: Secondary | ICD-10-CM | POA: Diagnosis not present

## 2015-12-10 DIAGNOSIS — R413 Other amnesia: Secondary | ICD-10-CM | POA: Diagnosis not present

## 2015-12-10 NOTE — Progress Notes (Signed)
PATIENT: Erin [name] DOB: 07/27/1920  REASON FOR VISIT: follow up- memory HISTORY FROM: patient  HISTORY OF PRESENT ILLNESS: Erin [name] is a 82 year old female with a history of stroke and mild memory disturbance. She returns today for an evaluation. The patient reveals that her memory has remained stable for her age. She states that she lives at her home with her daughter. She reports that she is able to complete all ADLs independently. She also reports that she tends to do some of the household chores herself. She does not operate a motor vehicle. Her primary care manages her blood pressure and cholesterol. She continues on aspirin for stroke prevention. Patient reports that she had a fall and since then she's been having pain in the knees and hips. She states that she recently received injections in the hips for this discomfort. She denies any new neurological symptoms. She returns today for an evaluation.  HISTORY  Update 06/09/2015 : She returns for follow-up after last visit 6 months ago. She continues to have mild short-term memory difficulties which appear to have slightly decline. She gets occasional confusion. She had her pacemaker replaced in October. She also had her thyroid medication dose changed. She has not been participating in activities for cognitively challenge  HISTORY: Erin [name] is a 59 year pleasant Caucasian lady with admission to [facility] for stroke on 06/01/09. She was admitted with sudden onset left hemiparesis but presented beyond time window for intervention. Initial Ct head was unremarkable. MRI could not be done due to having a pacemaker. CT angio showed 50-60% subclavian stenosis but no significant carotid or intracranial stenosis. Rt corona radiata white matter infarct was noted on repeat CT head.Cholestrol was 233 with LDL 160.HbA1c was 5.7 She was sent to short term SNF and received PT/OT and went home with home health and finished out patient  therapies and has done very well. She was changed from 81 to 325 mg Aspirin. REVIEW OF SYSTEMS: Out of a complete 14 system review of symptoms, the patient complains only of the following symptoms, and all other reviewed systems are negative.  Bladder, frequency of urination, joint pain, walking difficulty, agitation, memory loss  ALLERGIES: Allergies  Allergen Reactions  . Toprol Xl [Metoprolol Succinate] Other (See Comments)    UNKNOWN     HOME MEDICATIONS: Outpatient Prescriptions Prior to Visit  Medication Sig Dispense Refill  . aspirin 325 MG tablet Take 325 mg by mouth daily.    . Calcium Carb-Cholecalciferol (CALCIUM 1000 + D PO) Take 1,000 Units by mouth daily.     . calcium carbonate (TUMS EX) 750 MG chewable tablet Chew 2 tablets by mouth daily.     . chlordiazePOXIDE (LIBRIUM) 10 MG capsule Take 10 mg by mouth daily.     Marland Kitchen CINNAMON PO Take 1,000 mg by mouth 2 (two) times daily.    . Coenzyme Q10 (COQ-10) 400 MG CAPS Take 400 tablets by mouth daily.     Marland Kitchen CRANBERRY FRUIT PO Take 2,000 mg by mouth 2 (two) times daily.     . diclofenac sodium (VOLTAREN) 1 % GEL Apply 2 g topically 4 (four) times daily. 100 g 2  . Fish Oil-Cholecalciferol (FISH OIL + D3 PO) Take by mouth.    . furosemide (LASIX) 20 MG tablet Take 1 tablet (20 mg total) by mouth daily. 90 tablet 3  . Green Tea, Camillia sinensis, ([employer]S GREEN TEA EXTRACT PO) Take 2 tablets by mouth 2 (two) times daily.     Marland Kitchen  Hypromellose (ARTIFICIAL TEARS OP) Place 1 drop into both eyes 2 (two) times daily.    Marland Kitchen levothyroxine (SYNTHROID, LEVOTHROID) 50 MCG tablet Take 50 mcg by mouth daily before breakfast.    . levothyroxine (SYNTHROID, LEVOTHROID) 75 MCG tablet Take 75 mcg by mouth daily before breakfast.    . Misc Natural Products (OSTEO BI-FLEX ADV JOINT SHIELD PO) Take 1 tablet by mouth daily.    . Omega-3 Fatty Acids (FISH OIL PO) Take 2 capsules by mouth daily.    . simvastatin (ZOCOR) 20 MG tablet TAKE 1 TABLET (20 MG  TOTAL) BY MOUTH DAILY. 90 tablet 0   No facility-administered medications prior to visit.    PAST MEDICAL HISTORY: Past Medical History  Diagnosis Date  . Carotid artery plaque   . BBB (bundle branch block)   . HTN (hypertension)   . Hypothyroidism   . Breast cancer ([place])   . Hyperlipidemia   . DVT (deep venous thrombosis) ([place])   . Anxiety   . Diastolic CHF ([place])   . H/O: hysterectomy   . Stroke [facility])     PAST SURGICAL HISTORY: Past Surgical History  Procedure Laterality Date  . Mastectomy    . Cataracts    . Pacemaker insertion    . Carpal tunnel release    . Cardiac catheterization  2007  . Pacemaker generator change  08/08/2014    MDT Sensia dual chamber pacemaker generator change by Dr Lovena Le  . Permanent pacemaker generator change N/A 08/07/2014    Procedure: PERMANENT PACEMAKER GENERATOR CHANGE;  Surgeon: Evans Lance, MD;  Location: [facility] CATH LAB;  Service: Cardiovascular;  Laterality: N/A;  . Breast surgery    . Abdominal hysterectomy      FAMILY HISTORY: Family History  Problem Relation Age of Onset  . Unexplained death Mother 66  . Cancer Mother   . Hyperlipidemia Mother   . Stroke Mother   . Unexplained death Father 45    SOCIAL HISTORY: Social History   Social History  . Marital Status: Widowed    Spouse Name: N/A  . Number of Children: N/A  . Years of Education: 12th   Occupational History  . Retired    Social History Main Topics  . Smoking status: Never Smoker   . Smokeless tobacco: Never Used  . Alcohol Use: No  . Drug Use: No  . Sexual Activity: Not on file   Other Topics Concern  . Not on file   Social History Narrative   Patient is a widow.    Patient lives with daughter.   Patient has 2 adopted children.   Patient is retired.   Patient is right handed.   Patient has a high school education.               PHYSICAL EXAM  Filed Vitals:   12/10/15 1319  BP: 128/62  Pulse: 74  Height: '4\' 9"'  (1.448 m)  Weight: 98 lb  (44.453 kg)   Body mass index is 21.2 kg/(m^2).   MMSE - Mini Mental State Exam 12/10/2015 11/26/2014  Orientation to time 4 5  Orientation to Place 4 5  Registration 3 3  Attention/ Calculation 3 5  Recall 1 1  Language- name 2 objects 2 2  Language- repeat 1 1  Language- follow 3 step command 3 3  Language- read & follow direction 0 1  Write a sentence 1 1  Copy design 1 1  Total score 23 28    Generalized: Well  developed, in no acute distress   Neurological examination  Mentation: Alert. Follows all commands speech and language fluent Cranial nerve II-XII: Pupils were equal round reactive to light. Extraocular movements were full, visual field were full on confrontational test. Facial sensation and strength were normal. Uvula tongue midline. Head turning and shoulder shrug  were normal and symmetric. Motor: The motor testing reveals 5 over 5 strength of all 4 extremities. Good symmetric motor tone is noted throughout.  Sensory: Sensory testing is intact to soft touch on all 4 extremities. No evidence of extinction is noted.  Coordination: Cerebellar testing reveals good finger-nose-finger and heel-to-shin bilaterally.  Gait and station: Gait is slightly unsteady. Tandem gait not attempted. Reflexes: Deep tendon reflexes are symmetric and normal bilaterally.   DIAGNOSTIC DATA (LABS, IMAGING, TESTING) - I reviewed patient records, labs, notes, testing and imaging myself where available. MMSE - Mini Mental State Exam 12/10/2015 11/26/2014  Orientation to time 4 5  Orientation to Place 4 5  Registration 3 3  Attention/ Calculation 3 5  Recall 1 1  Language- name 2 objects 2 2  Language- repeat 1 1  Language- follow 3 step command 3 3  Language- read & follow direction 0 1  Write a sentence 1 1  Copy design 1 1  Total score 23 28    Lab Results  Component Value Date   WBC 5.8 07/22/2014   HGB 12.9 07/22/2014   HCT 38.5 07/22/2014   MCV 89.9 07/22/2014   PLT 244.0  07/22/2014      Component Value Date/Time   NA 136 07/22/2014 1324   K 3.8 07/22/2014 1324   CL 100 07/22/2014 1324   CO2 29 07/22/2014 1324   GLUCOSE 81 07/22/2014 1324   BUN 13 07/22/2014 1324   CREATININE 0.8 07/22/2014 1324   CALCIUM 8.6 07/22/2014 1324   PROT 6.4 06/01/2009 2009   ALBUMIN 3.4* 06/01/2009 2009   AST 25 06/01/2009 2009   ALT 16 06/01/2009 2009   ALKPHOS 57 06/01/2009 2009   BILITOT 0.7 06/01/2009 2009   GFRNONAA >60 06/02/2009 0340   GFRAA  06/02/2009 0340    >60        The eGFR has been calculated using the MDRD equation. This calculation has not been validated in all clinical situations. eGFR's persistently <60 mL/min signify possible Chronic Kidney Disease.    Lab Results  Component Value Date   HGBA1C  06/01/2009    5.7 (NOTE) The ADA recommends the following therapeutic goal for glycemic control related to Hgb A1c measurement: Goal of therapy: <6.5 Hgb A1c  Reference: American Diabetes Association: Clinical Practice Recommendations 2010, Diabetes Care, 2010, 33: (Suppl  1).   Lab Results  Component Value Date   VITAMINB12 >1999* 09/02/2014   Lab Results  Component Value Date   TSH 0.389* 09/02/2014      ASSESSMENT AND PLAN 82 y.o. year old female  has a past medical history of Carotid artery plaque; BBB (bundle branch block); HTN (hypertension); Hypothyroidism; Breast cancer ([place]); Hyperlipidemia; DVT (deep venous thrombosis) ([place]); Anxiety; Diastolic CHF ([place]); H/O: hysterectomy; and Stroke ([place]). here with:  1. History of stroke 2. Memory disturbance  The patient's memory has slightly decreased. Her MMSE today is 23/30 her previous score was 28/30. At this time we will continue to monitor the memory. Patient and her daughter advised that if she has any sudden changes they should let us know. Patient will remain on aspirin for stroke prevention. Patient's primary care continue  to manage her blood pressure and cholesterol. She will  follow-up in 6 months or sooner if needed.    Ward Givens, MSN, NP-C 12/10/2015, 1:34 PM [facility] [address] [place], [place] [address] [phone]

## 2015-12-10 NOTE — Patient Instructions (Signed)
We will continue to monitor the memory If you notice any sudden changes let us know If your symptoms worsen or you develop new symptoms please let us know.

## 2015-12-12 NOTE — Progress Notes (Signed)
I agree with the above plan 

## 2015-12-25 ENCOUNTER — Encounter: Payer: Self-pay | Admitting: Internal Medicine

## 2015-12-25 ENCOUNTER — Ambulatory Visit (INDEPENDENT_AMBULATORY_CARE_PROVIDER_SITE_OTHER): Payer: Medicare Other | Admitting: Internal Medicine

## 2015-12-25 VITALS — BP 142/78 | HR 67 | Ht 60.0 in | Wt 97.2 lb

## 2015-12-25 DIAGNOSIS — I442 Atrioventricular block, complete: Secondary | ICD-10-CM

## 2015-12-25 LAB — CUP PACEART INCLINIC DEVICE CHECK
Brady Statistic AP VP Percent: 2 %
Brady Statistic AP VS Percent: 0 %
Brady Statistic AS VP Percent: 98 %
Brady Statistic AS VS Percent: 0 %
Date Time Interrogation Session: 20170217170136
Implantable Lead Implant Date: 20070316
Implantable Lead Location: 753859
Implantable Lead Location: 753860
Implantable Lead Model: 4470
Implantable Lead Serial Number: 523845
Lead Channel Impedance Value: 449 Ohm
Lead Channel Impedance Value: 599 Ohm
Lead Channel Pacing Threshold Amplitude: 0.5 V
Lead Channel Pacing Threshold Amplitude: 0.5 V
Lead Channel Pacing Threshold Pulse Width: 0.4 ms
Lead Channel Pacing Threshold Pulse Width: 0.4 ms
Lead Channel Sensing Intrinsic Amplitude: 4 mV
Lead Channel Setting Sensing Sensitivity: 4 mV
MDC IDC LEAD IMPLANT DT: 20070316
MDC IDC LEAD SERIAL: 478729
MDC IDC MSMT BATTERY IMPEDANCE: 104 Ohm
MDC IDC MSMT BATTERY REMAINING LONGEVITY: 126 mo
MDC IDC MSMT BATTERY VOLTAGE: 2.79 V
MDC IDC SET LEADCHNL RA PACING AMPLITUDE: 2 V
MDC IDC SET LEADCHNL RV PACING AMPLITUDE: 2.5 V
MDC IDC SET LEADCHNL RV PACING PULSEWIDTH: 0.4 ms

## 2015-12-25 NOTE — Patient Instructions (Signed)
Medication Instructions:  Your physician recommends that you continue on your current medications as directed. Please refer to the Current Medication list given to you today.  Labwork: None ordered  Testing/Procedures: None ordered  Follow-Up: Remote monitoring is used to monitor your Pacemaker of ICD from home. This monitoring reduces the number of office visits required to check your device to one time per year. It allows [place] to keep an eye on the functioning of your device to ensure it is working properly. You are scheduled for a device check from home on 03/28/2016. You may send your transmission at any time that day. If you have a wireless device, the transmission will be sent automatically. After your physician reviews your transmission, you will receive a postcard with your next transmission date.  Your physician wants you to follow-up in: 1 year with Dr. Lovena Le.  You will receive a reminder letter in the mail two months in advance. If you don't receive a letter, please call our office to schedule the follow-up appointment.  If you need a refill on your cardiac medications before your next appointment, please call your pharmacy.  Thank you for choosing [facility]!!

## 2015-12-25 NOTE — Progress Notes (Signed)
HPI Erin [name] is a very pleasant 82 year old woman with a history of complete heart block, status post permanent pacemaker insertion, hypertension, and dyslipidemia. In the interim, she has been stable. She denies chest pain, or shortness of breath. No syncope or peripheral edema. No cough or hemoptysis. She has had problems with arthritis and pain in her left leg. Allergies  Allergen Reactions  . Toprol Xl [Metoprolol Succinate] Other (See Comments)    UNKNOWN      Current Outpatient Prescriptions  Medication Sig Dispense Refill  . aspirin 325 MG tablet Take 325 mg by mouth daily.    . Calcium Carb-Cholecalciferol (CALCIUM 1000 + D PO) Take 1,000 Units by mouth daily.     . calcium carbonate (TUMS EX) 750 MG chewable tablet Chew 2 tablets by mouth daily.     . chlordiazePOXIDE (LIBRIUM) 10 MG capsule Take 10 mg by mouth daily.     Marland Kitchen CINNAMON PO Take 1,000 mg by mouth 2 (two) times daily.    . Coenzyme Q10 (COQ-10) 400 MG CAPS Take 400 tablets by mouth daily.     Marland Kitchen CRANBERRY FRUIT PO Take 2,000 mg by mouth 2 (two) times daily.     . diclofenac sodium (VOLTAREN) 1 % GEL Apply 2 g topically 4 (four) times daily. 100 g 2  . Fish Oil-Cholecalciferol (FISH OIL + D3 PO) Take by mouth.    . furosemide (LASIX) 20 MG tablet Take 1 tablet (20 mg total) by mouth daily. 90 tablet 3  . Green Tea, Camillia sinensis, ([employer]S GREEN TEA EXTRACT PO) Take 2 tablets by mouth 2 (two) times daily.     . Hypromellose (ARTIFICIAL TEARS OP) Place 1 drop into both eyes 2 (two) times daily.    Marland Kitchen levothyroxine (SYNTHROID, LEVOTHROID) 50 MCG tablet Take 50 mcg by mouth daily before breakfast.    . levothyroxine (SYNTHROID, LEVOTHROID) 75 MCG tablet Take 75 mcg by mouth daily before breakfast.    . Misc Natural Products (OSTEO BI-FLEX ADV JOINT SHIELD PO) Take 1 tablet by mouth daily.    . Omega-3 Fatty Acids (FISH OIL PO) Take 2 capsules by mouth daily.    . simvastatin (ZOCOR) 20 MG tablet TAKE 1 TABLET (20 MG TOTAL)  BY MOUTH DAILY. 90 tablet 0   No current facility-administered medications for this visit.     Past Medical History  Diagnosis Date  . Carotid artery plaque   . BBB (bundle branch block)   . HTN (hypertension)   . Hypothyroidism   . Breast cancer ([place])   . Hyperlipidemia   . DVT (deep venous thrombosis) ([place])   . Anxiety   . Diastolic CHF ([place])   . H/O: hysterectomy   . Stroke ([place])     ROS:   All systems reviewed and negative except as noted in the HPI.   Past Surgical History  Procedure Laterality Date  . Mastectomy    . Cataracts    . Pacemaker insertion    . Carpal tunnel release    . Cardiac catheterization  2007  . Pacemaker generator change  08/08/2014    MDT Sensia dual chamber pacemaker generator change by Dr Lovena Le  . Permanent pacemaker generator change N/A 08/07/2014    Procedure: PERMANENT PACEMAKER GENERATOR CHANGE;  Surgeon: Evans Lance, MD;  [facility] C[facility] LAB;  Service: Cardiovascular;  Laterality: N/A;  . Breast surgery    . Abdominal hysterectomy       Family History  Problem Relation Age of  Onset  . Unexplained death Mother 69  . Cancer Mother   . Hyperlipidemia Mother   . Stroke Mother   . Unexplained death Father 27     Social History   Social History  . Marital Status: Widowed    Spouse Name: N/A  . Number of Children: N/A  . Years of Education: 12th   Occupational History  . Retired    Social History Main Topics  . Smoking status: Never Smoker   . Smokeless tobacco: Never Used  . Alcohol Use: No  . Drug Use: No  . Sexual Activity: Not on file   Other Topics Concern  . Not on file   Social History Narrative   Patient is a widow.    Patient lives with daughter.   Patient has 2 adopted children.   Patient is retired.   Patient is right handed.   Patient has a high school education.              BP 142/78 mmHg  Pulse 67  Ht 5' (1.524 m)  Wt 97 lb 3.2 oz (44.09 kg)  BMI 18.98 kg/m2  Physical  Exam:  Frail, but Well appearing elderly woman,NAD HEENT: Unremarkable Neck:  No JVD, no thyromegally Lungs:  Clear with no wheezes, rales, or rhonchi. Well healed PM incision. HEART:  Regular rate rhythm, no murmurs, no rubs, no clicks Abd:  soft, positive bowel sounds, no organomegally, no rebound, no guarding Ext:  2 plus pulses, no edema, no cyanosis, no clubbing Skin:  No rashes no nodules Neuro:  CN II through XII intact, motor grossly intact  DEVICE  Normal device function.  See PaceArt for details.   Assess/Plan: 1. Complete heart block - she is stable s/p PPM insertion 2. HTN - her blood pressure is well controlled, though a bit elevated in the office today 3. PPM - her medtronic DDD PM is working normally. Will recheck in several months.  Mikle Bosworth.D.

## 2016-03-28 ENCOUNTER — Ambulatory Visit (INDEPENDENT_AMBULATORY_CARE_PROVIDER_SITE_OTHER): Payer: Medicare Other | Admitting: *Deleted

## 2016-03-28 DIAGNOSIS — I442 Atrioventricular block, complete: Secondary | ICD-10-CM | POA: Diagnosis not present

## 2016-03-28 NOTE — Progress Notes (Signed)
Remote pacemaker transmission.   

## 2016-04-15 LAB — CUP PACEART REMOTE DEVICE CHECK
Battery Remaining Longevity: 114 mo
Battery Voltage: 2.79 V
Brady Statistic AS VP Percent: 99 %
Date Time Interrogation Session: 20170522115046
Implantable Lead Implant Date: 20070316
Implantable Lead Model: 4469
Implantable Lead Model: 4470
Lead Channel Pacing Threshold Amplitude: 0.375 V
Lead Channel Pacing Threshold Pulse Width: 0.4 ms
Lead Channel Setting Pacing Amplitude: 2.5 V
Lead Channel Setting Pacing Pulse Width: 0.4 ms
Lead Channel Setting Sensing Sensitivity: 4 mV
MDC IDC LEAD IMPLANT DT: 20070316
MDC IDC LEAD LOCATION: 753859
MDC IDC LEAD LOCATION: 753860
MDC IDC LEAD SERIAL: 478729
MDC IDC LEAD SERIAL: 523845
MDC IDC MSMT BATTERY IMPEDANCE: 153 Ohm
MDC IDC MSMT LEADCHNL RA IMPEDANCE VALUE: 455 Ohm
MDC IDC MSMT LEADCHNL RA PACING THRESHOLD PULSEWIDTH: 0.4 ms
MDC IDC MSMT LEADCHNL RA SENSING INTR AMPL: 2.8 mV
MDC IDC MSMT LEADCHNL RV IMPEDANCE VALUE: 597 Ohm
MDC IDC MSMT LEADCHNL RV PACING THRESHOLD AMPLITUDE: 0.625 V
MDC IDC SET LEADCHNL RA PACING AMPLITUDE: 2 V
MDC IDC STAT BRADY AP VP PERCENT: 1 %
MDC IDC STAT BRADY AP VS PERCENT: 0 %
MDC IDC STAT BRADY AS VS PERCENT: 0 %

## 2016-04-21 ENCOUNTER — Encounter: Payer: Self-pay | Admitting: Cardiology

## 2016-06-14 ENCOUNTER — Ambulatory Visit (INDEPENDENT_AMBULATORY_CARE_PROVIDER_SITE_OTHER): Payer: Medicare Other | Admitting: Adult Health

## 2016-06-14 ENCOUNTER — Encounter: Payer: Self-pay | Admitting: Adult Health

## 2016-06-14 VITALS — BP 126/63 | HR 64 | Ht 60.0 in | Wt 99.6 lb

## 2016-06-14 DIAGNOSIS — Z8673 Personal history of transient ischemic attack (TIA), and cerebral infarction without residual deficits: Secondary | ICD-10-CM

## 2016-06-14 DIAGNOSIS — R413 Other amnesia: Secondary | ICD-10-CM | POA: Diagnosis not present

## 2016-06-14 NOTE — Progress Notes (Signed)
PATIENT: Erin [name] DOB: July 12, 1920  REASON FOR VISIT: follow up- memory, stroke HISTORY FROM: patient  HISTORY OF PRESENT ILLNESS: Erin [name] is a 82 year old female with a history of stroke and mild memory disturbance. She returns today for follow-up. She continues to live at home with her daughter. She is able to complete all ADLs independently. She remains on aspirin for stroke prevention. Primary care manages her blood pressure and cholesterol. She denies any stroke like symptoms. She does not operate a motor vehicle. States that she sleeps well at night. Good appetite. Continue to have left hip pain. Will have injection next week. She returns today for an evaluation.  HISTORY 12/10/15: Erin [name] is a 82 year old female with a history of stroke and mild memory disturbance. She returns today for an evaluation. The patient reveals that her memory has remained stable for her age. She states that she lives at her home with her daughter. She reports that she is able to complete all ADLs independently. She also reports that she tends to do some of the household chores herself. She does not operate a motor vehicle. Her primary care manages her blood pressure and cholesterol. She continues on aspirin for stroke prevention. Patient reports that she had a fall and since then she's been having pain in the knees and hips. She states that she recently received injections in the hips for this discomfort. She denies any new neurological symptoms. She returns today for an evaluation.  HISTORY  Update 06/09/2015 : She returns for follow-up after last visit 6 months ago. She continues to have mild short-term memory difficulties which appear to have slightly decline. She gets occasional confusion. She had her pacemaker replaced in October. She also had her thyroid medication dose changed. She has not been participating in activities for cognitively challenge  HISTORY: [name] is a 55 year pleasant  Caucasian lady with admission to [facility] for stroke on 06/01/09. She was admitted with sudden onset left hemiparesis but presented beyond time window for intervention. Initial Ct head was unremarkable. MRI could not be done due to having a pacemaker. CT angio showed 50-60% subclavian stenosis but no significant carotid or intracranial stenosis. Rt corona radiata white matter infarct was noted on repeat CT head.Cholestrol was 233 with LDL 160.HbA1c was 5.7 She was sent to short term SNF and received PT/OT and went home with home health and finished out patient therapies and has done very well. She was changed from 81 to 325 mg Aspirin  REVIEW OF SYSTEMS: Out of a complete 14 system review of symptoms, the patient complains only of the following symptoms, and all other reviewed systems are negative.  ALLERGIES: Allergies  Allergen Reactions  . Toprol Xl [Metoprolol Succinate] Other (See Comments)    UNKNOWN     HOME MEDICATIONS: Outpatient Medications Prior to Visit  Medication Sig Dispense Refill  . aspirin 325 MG tablet Take 325 mg by mouth daily.    . Calcium Carb-Cholecalciferol (CALCIUM 1000 + D PO) Take 1,000 Units by mouth daily.     . calcium carbonate (TUMS EX) 750 MG chewable tablet Chew 2 tablets by mouth daily.     . chlordiazePOXIDE (LIBRIUM) 10 MG capsule Take 10 mg by mouth daily.     Marland Kitchen CINNAMON PO Take 1,000 mg by mouth 2 (two) times daily.    . Coenzyme Q10 (COQ-10) 400 MG CAPS Take 400 tablets by mouth daily.     Marland Kitchen CRANBERRY FRUIT PO Take  2,000 mg by mouth 2 (two) times daily.     . Fish Oil-Cholecalciferol (FISH OIL + D3 PO) Take by mouth.    . furosemide (LASIX) 20 MG tablet Take 1 tablet (20 mg total) by mouth daily. 90 tablet 3  . Green Tea, Camillia sinensis, ([employer] GREEN TEA EXTRACT PO) Take 2 tablets by mouth 2 (two) times daily.     Marland Kitchen levothyroxine (SYNTHROID, LEVOTHROID) 50 MCG tablet Take 50 mcg by mouth daily before breakfast.    . levothyroxine  (SYNTHROID, LEVOTHROID) 75 MCG tablet Take 75 mcg by mouth daily before breakfast.    . Misc Natural Products (OSTEO BI-FLEX ADV JOINT SHIELD PO) Take 1 tablet by mouth daily.    . Omega-3 Fatty Acids (FISH OIL PO) Take 2 capsules by mouth daily.    . simvastatin (ZOCOR) 20 MG tablet TAKE 1 TABLET (20 MG TOTAL) BY MOUTH DAILY. 90 tablet 0  . diclofenac sodium (VOLTAREN) 1 % GEL Apply 2 g topically 4 (four) times daily. 100 g 2  . Hypromellose (ARTIFICIAL TEARS OP) Place 1 drop into both eyes 2 (two) times daily.     No facility-administered medications prior to visit.     PAST MEDICAL HISTORY: Past Medical History:  Diagnosis Date  . Anxiety   . BBB (bundle branch block)   . Breast cancer ([place])   . Carotid artery plaque   . Diastolic CHF ([place])   . DVT (deep venous thrombosis) ([place])   . H/O: hysterectomy   . HTN (hypertension)   . Hyperlipidemia   . Hypothyroidism   . Stroke [facility])     PAST SURGICAL HISTORY: Past Surgical History:  Procedure Laterality Date  . ABDOMINAL HYSTERECTOMY    . BREAST SURGERY    . CARDIAC CATHETERIZATION  2007  . CARPAL TUNNEL RELEASE    . cataracts    . MASTECTOMY    . PACEMAKER GENERATOR CHANGE  08/08/2014   MDT [name] dual chamber pacemaker generator change by Dr Lovena Le  . PACEMAKER INSERTION    . PERMANENT PACEMAKER GENERATOR CHANGE N/A 08/07/2014   Procedure: PERMANENT PACEMAKER GENERATOR CHANGE;  Surgeon: Evans Lance, MD;  Location: [facility] CATH LAB;  Service: Cardiovascular;  Laterality: N/A;    FAMILY HISTORY: Family History  Problem Relation Age of Onset  . Unexplained death Mother 60  . Cancer Mother   . Hyperlipidemia Mother   . Stroke Mother   . Unexplained death Father 81    SOCIAL HISTORY: Social History   Social History  . Marital status: Widowed    Spouse name: N/A  . Number of children: N/A  . Years of education: 12th   Occupational History  . Retired    Social History Main Topics  . Smoking status: Never Smoker    . Smokeless tobacco: Never Used  . Alcohol use No  . Drug use: No  . Sexual activity: Not on file   Other Topics Concern  . Not on file   Social History Narrative   Patient is a widow.    Patient lives with daughter.   Patient has 2 adopted children.   Patient is retired.   Patient is right handed.   Patient has a high school education.               PHYSICAL EXAM  Vitals:   06/14/16 1325  BP: 126/63  Pulse: 64  Weight: 99 lb 9.6 oz (45.2 kg)  Height: 5' (1.524 m)   Body mass index  is 19.45 kg/m.  Generalized: Well developed, in no acute distress   Neurological examination  Mentation: Alert oriented to time, place, history taking. Follows all commands speech and language fluent Cranial nerve II-XII: Pupils were equal round reactive to light. Extraocular movements were full, visual field were full on confrontational test. Facial sensation and strength were normal. Uvula tongue midline. Head turning and shoulder shrug  were normal and symmetric. Motor: The motor testing reveals 5 over 5 strength of all 4 extremities. Good symmetric motor tone is noted throughout.  Sensory: Sensory testing is intact to soft touch on all 4 extremities. No evidence of extinction is noted.  Coordination: Cerebellar testing reveals good finger-nose-finger and heel-to-shin bilaterally.  Gait and station: Gait is normal. Tandem gait is normal. Romberg is negative. No drift is seen.  Reflexes: Deep tendon reflexes are symmetric and normal bilaterally.   DIAGNOSTIC DATA (LABS, IMAGING, TESTING) - I reviewed patient records, labs, notes, testing and imaging myself where available.  Lab Results  Component Value Date   WBC 5.8 07/22/2014   HGB 12.9 07/22/2014   HCT 38.5 07/22/2014   MCV 89.9 07/22/2014   PLT 244.0 07/22/2014      Component Value Date/Time   NA 136 07/22/2014 1324   K 3.8 07/22/2014 1324   CL 100 07/22/2014 1324   CO2 29 07/22/2014 1324   GLUCOSE 81 07/22/2014 1324    BUN 13 07/22/2014 1324   CREATININE 0.8 07/22/2014 1324   CALCIUM 8.6 07/22/2014 1324   PROT 6.4 06/01/2009 2009   ALBUMIN 3.4 (L) 06/01/2009 2009   AST 25 06/01/2009 2009   ALT 16 06/01/2009 2009   ALKPHOS 57 06/01/2009 2009   BILITOT 0.7 06/01/2009 2009   GFRNONAA >60 06/02/2009 0340   GFRAA  06/02/2009 0340    >60        The eGFR has been calculated using the MDRD equation. This calculation has not been validated in all clinical situations. eGFR's persistently <60 mL/min signify possible Chronic Kidney Disease.   Lab Results  Component Value Date   CHOL (H) 06/02/2009    223        ATP III CLASSIFICATION:  <200     mg/dL   Desirable  200-239  mg/dL   Borderline High  >=240    mg/dL   High          HDL 50 06/02/2009   LDLCALC (H) 06/02/2009    160        Total Cholesterol/HDL:CHD Risk Coronary Heart Disease Risk Table                     Men   Women  1/2 Average Risk   3.4   3.3  Average Risk       5.0   4.4  2 X Average Risk   9.6   7.1  3 X Average Risk  23.4   11.0        Use the calculated Patient Ratio above and the CHD Risk Table to determine the patient's CHD Risk.        ATP III CLASSIFICATION (LDL):  <100     mg/dL   Optimal  100-129  mg/dL   Near or Above                    Optimal  130-159  mg/dL   Borderline  160-189  mg/dL   High  >190     mg/dL  Very High   TRIG 66 06/02/2009   CHOLHDL 4.5 06/02/2009   Lab Results  Component Value Date   HGBA1C  06/01/2009    5.7 (NOTE) The ADA recommends the following therapeutic goal for glycemic control related to Hgb A1c measurement: Goal of therapy: <6.5 Hgb A1c  Reference: American Diabetes Association: Clinical Practice Recommendations 2010, Diabetes Care, 2010, 33: (Suppl  1).   Lab Results  Component Value Date   VITAMINB12 >1999 (H) 09/02/2014   Lab Results  Component Value Date   TSH 0.389 (L) 09/02/2014      ASSESSMENT AND PLAN 82 y.o. year old female  has a past medical history of  Anxiety; BBB (bundle branch block); Breast cancer ([place]); Carotid artery plaque; Diastolic CHF ([facility]); DVT (deep venous thrombosis) ([place]); H/O: hysterectomy; HTN (hypertension); Hyperlipidemia; Hypothyroidism; and Stroke [facility]). here with:  1. History of stroke 2. Memory disturbance  The patient will remain on aspirin for stroke prevention. She should maintain strict control of the blood pressure with goal less than 130/90 and cholesterol less than 100. Her memory score has remained stable. MMSE today is 25/30 was previously 23/30. We will continue to monitor. Patient advised that if her symptoms worsen or she develops any new symptoms she should let us know. Follow-up in 6 months with Dr. Leonie Man.    Ward Givens, MSN, NP-C 06/14/2016, 1:29 PM [facility] [address] [place], [place] [address] [phone]

## 2016-06-14 NOTE — Progress Notes (Signed)
I have read the note, and I agree with the clinical assessment and plan.  Erin [name],Erin [name]   

## 2016-06-14 NOTE — Patient Instructions (Signed)
Memory stable Continue aspirin Blood Pressure goal <130/90 Cholesterol LDL <100 If your symptoms worsen or you develop new symptoms please let us know.

## 2016-06-27 ENCOUNTER — Ambulatory Visit (INDEPENDENT_AMBULATORY_CARE_PROVIDER_SITE_OTHER): Payer: Medicare Other | Admitting: *Deleted

## 2016-06-27 DIAGNOSIS — I442 Atrioventricular block, complete: Secondary | ICD-10-CM

## 2016-06-27 NOTE — Progress Notes (Signed)
Remote pacemaker transmission.   

## 2016-06-29 ENCOUNTER — Encounter: Payer: Self-pay | Admitting: Cardiology

## 2016-07-05 LAB — CUP PACEART REMOTE DEVICE CHECK
Battery Remaining Longevity: 114 mo
Brady Statistic AP VP Percent: 1 %
Brady Statistic AS VS Percent: 0 %
Implantable Lead Implant Date: 20070316
Implantable Lead Location: 753860
Lead Channel Impedance Value: 582 Ohm
Lead Channel Pacing Threshold Amplitude: 0.5 V
Lead Channel Setting Pacing Amplitude: 2 V
Lead Channel Setting Pacing Pulse Width: 0.4 ms
Lead Channel Setting Sensing Sensitivity: 4 mV
MDC IDC LEAD IMPLANT DT: 20070316
MDC IDC LEAD LOCATION: 753859
MDC IDC LEAD SERIAL: 478729
MDC IDC LEAD SERIAL: 523845
MDC IDC MSMT BATTERY IMPEDANCE: 153 Ohm
MDC IDC MSMT BATTERY VOLTAGE: 2.78 V
MDC IDC MSMT LEADCHNL RA IMPEDANCE VALUE: 449 Ohm
MDC IDC MSMT LEADCHNL RA PACING THRESHOLD AMPLITUDE: 0.375 V
MDC IDC MSMT LEADCHNL RA PACING THRESHOLD PULSEWIDTH: 0.4 ms
MDC IDC MSMT LEADCHNL RA SENSING INTR AMPL: 2.8 mV
MDC IDC MSMT LEADCHNL RV PACING THRESHOLD PULSEWIDTH: 0.4 ms
MDC IDC SESS DTM: 20170821121544
MDC IDC SET LEADCHNL RV PACING AMPLITUDE: 2.5 V
MDC IDC STAT BRADY AP VS PERCENT: 0 %
MDC IDC STAT BRADY AS VP PERCENT: 99 %

## 2016-08-22 ENCOUNTER — Telehealth: Payer: Self-pay | Admitting: Cardiology

## 2016-08-22 ENCOUNTER — Emergency Department (HOSPITAL_COMMUNITY)
Admission: EM | Admit: 2016-08-22 | Discharge: 2016-08-22 | Disposition: A | Discharge status: Home / Self Care | Payer: Medicare Other | Attending: Emergency Medicine | Admitting: Emergency Medicine

## 2016-08-22 ENCOUNTER — Other Ambulatory Visit: Payer: Self-pay

## 2016-08-22 ENCOUNTER — Encounter (HOSPITAL_COMMUNITY): Payer: Self-pay

## 2016-08-22 ENCOUNTER — Emergency Department (HOSPITAL_COMMUNITY): Payer: Medicare Other

## 2016-08-22 DIAGNOSIS — I110 Hypertensive heart disease with heart failure: Secondary | ICD-10-CM | POA: Diagnosis not present

## 2016-08-22 DIAGNOSIS — R9431 Abnormal electrocardiogram [ECG] [EKG]: Secondary | ICD-10-CM | POA: Insufficient documentation

## 2016-08-22 DIAGNOSIS — I5030 Unspecified diastolic (congestive) heart failure: Secondary | ICD-10-CM | POA: Diagnosis not present

## 2016-08-22 DIAGNOSIS — Z853 Personal history of malignant neoplasm of breast: Secondary | ICD-10-CM | POA: Diagnosis not present

## 2016-08-22 DIAGNOSIS — Z8673 Personal history of transient ischemic attack (TIA), and cerebral infarction without residual deficits: Secondary | ICD-10-CM | POA: Insufficient documentation

## 2016-08-22 DIAGNOSIS — Z7982 Long term (current) use of aspirin: Secondary | ICD-10-CM | POA: Insufficient documentation

## 2016-08-22 DIAGNOSIS — E039 Hypothyroidism, unspecified: Secondary | ICD-10-CM | POA: Insufficient documentation

## 2016-08-22 DIAGNOSIS — R079 Chest pain, unspecified: Secondary | ICD-10-CM | POA: Insufficient documentation

## 2016-08-22 DIAGNOSIS — Z79899 Other long term (current) drug therapy: Secondary | ICD-10-CM | POA: Insufficient documentation

## 2016-08-22 DIAGNOSIS — Z5321 Procedure and treatment not carried out due to patient leaving prior to being seen by health care provider: Secondary | ICD-10-CM | POA: Diagnosis not present

## 2016-08-22 DIAGNOSIS — Z950 Presence of cardiac pacemaker: Secondary | ICD-10-CM | POA: Insufficient documentation

## 2016-08-22 LAB — BASIC METABOLIC PANEL
ANION GAP: 7 (ref 5–15)
BUN: 13 mg/dL (ref 6–20)
CALCIUM: 9 mg/dL (ref 8.9–10.3)
CO2: 27 mmol/L (ref 22–32)
Chloride: 103 mmol/L (ref 101–111)
Creatinine, Ser: 0.86 mg/dL (ref 0.44–1.00)
GFR, EST NON AFRICAN AMERICAN: 56 mL/min — AB (ref >60)
GLUCOSE: 73 mg/dL (ref 65–99)
POTASSIUM: 3.5 mmol/L (ref 3.5–5.1)
Sodium: 137 mmol/L (ref 135–145)

## 2016-08-22 LAB — I-STAT TROPONIN, ED: TROPONIN I, POC: 0.01 ng/mL (ref 0.00–0.08)

## 2016-08-22 LAB — CBC
HEMATOCRIT: 41.9 % (ref 36.0–46.0)
HEMOGLOBIN: 13.7 g/dL (ref 12.0–15.0)
MCH: 28.8 pg (ref 26.0–34.0)
MCHC: 32.7 g/dL (ref 30.0–36.0)
MCV: 88.2 fL (ref 78.0–100.0)
Platelets: 272 10*3/uL (ref 150–400)
RBC: 4.75 MIL/uL (ref 3.87–5.11)
RDW: 14.5 % (ref 11.5–15.5)
WBC: 5.5 10*3/uL (ref 4.0–10.5)

## 2016-08-22 NOTE — ED Notes (Signed)
Pt requesting to leave. RN encouraged pt to return if worsening symptoms. Pt a/o x 4, ambulatory. NAD.

## 2016-08-22 NOTE — Telephone Encounter (Signed)
Pt daughter called in and stated that pt was having chest pain around 9:30 AM. No shortness of breath. No dizziness. Pt daughter is going to send a remote transmission. I informed her that once we receive it someone will review it and call her back. Pt daughter verbalized understanding.

## 2016-08-22 NOTE — ED Triage Notes (Signed)
Pt complaining of chest pain this morning. Pt states has Psychologist, forensic. Pt seen today by PCP. Sent here for follow up d/t "abdnormal EKG."

## 2016-08-22 NOTE — Telephone Encounter (Signed)
Called patient's daughter, advised that manual transmission does not indicate any alerts or abnormalities.  Patient reports (via daughter) that pain was "stabbing", felt across her chest, strong enough to "let her know it was there".  It lasted 2 minutes or more, but she is unsure of the exact duration.  Patient was lying in bed on her back at the time, the pain woke her up from sleep.  Patient had not eaten recently, nothing made it better or worse, it suddenly subsided.  Patient denies SOB, dizziness, or other symptoms at this time.    Advised patient that if chest discomfort resumes and worsens or does not subside, that she should seek emergency medical attention.  Advised that I will forward this message to Dr. Lovena Le and his nurse to be addressed when he is back in the office.  Patient's daughter is appreciative and verbalizes agreement with this plan.  She denies additional questions or concerns at this time.

## 2016-09-26 ENCOUNTER — Ambulatory Visit (INDEPENDENT_AMBULATORY_CARE_PROVIDER_SITE_OTHER): Payer: Medicare Other | Admitting: *Deleted

## 2016-09-26 DIAGNOSIS — I442 Atrioventricular block, complete: Secondary | ICD-10-CM | POA: Diagnosis not present

## 2016-09-26 NOTE — Progress Notes (Signed)
Remote pacemaker transmission.   

## 2016-09-28 ENCOUNTER — Encounter: Payer: Self-pay | Admitting: Cardiology

## 2016-10-12 LAB — CUP PACEART REMOTE DEVICE CHECK
Battery Impedance: 178 Ohm
Battery Voltage: 2.78 V
Brady Statistic AP VS Percent: 0 %
Brady Statistic AS VP Percent: 99 %
Brady Statistic AS VS Percent: 0 %
Date Time Interrogation Session: 20171120124759
Implantable Lead Implant Date: 20070316
Implantable Lead Location: 753859
Implantable Lead Location: 753860
Implantable Lead Model: 4469
Implantable Lead Serial Number: 478729
Implantable Lead Serial Number: 523845
Lead Channel Impedance Value: 455 Ohm
Lead Channel Impedance Value: 582 Ohm
Lead Channel Pacing Threshold Amplitude: 0.25 V
Lead Channel Pacing Threshold Pulse Width: 0.4 ms
Lead Channel Setting Pacing Amplitude: 2.5 V
MDC IDC LEAD IMPLANT DT: 20070316
MDC IDC MSMT BATTERY REMAINING LONGEVITY: 109 mo
MDC IDC MSMT LEADCHNL RA SENSING INTR AMPL: 2.8 mV
MDC IDC MSMT LEADCHNL RV PACING THRESHOLD AMPLITUDE: 0.5 V
MDC IDC MSMT LEADCHNL RV PACING THRESHOLD PULSEWIDTH: 0.4 ms
MDC IDC PG IMPLANT DT: 20150930
MDC IDC SET LEADCHNL RA PACING AMPLITUDE: 2 V
MDC IDC SET LEADCHNL RV PACING PULSEWIDTH: 0.4 ms
MDC IDC SET LEADCHNL RV SENSING SENSITIVITY: 4 mV
MDC IDC STAT BRADY AP VP PERCENT: 1 %

## 2016-12-01 ENCOUNTER — Telehealth: Payer: Self-pay | Admitting: *Deleted

## 2016-12-01 NOTE — Telephone Encounter (Signed)
PT WALKED IN   CLAIMS  SHE  HAD  HEART ATTACK  THIS  AM  AROUND  6:00 AM  . PT  NOTED  RADIATING  PAIN ALL AROUND  CHEST  AND  ABDOMEN  AREA  NO OTHER  SYMPTOMS  PAIN FREE  RIGHT  NOW  B/P  148/82 AND P  70 . INSTRUCTED PT  NEEDS TO GO TO ER  FOR  EVAL  AND  TREATMENT  REFUSES  AT THIS TIME   HAS  UPCOMING APPT  WITH DR Lovena Le WILL KEEP  AS PLANNED   INSTRUCTED  PT  IF NOTES  INCREASE   IN EPISODES OR  OTHER  SYMPTOMS  TO  GO  TO ER    PT VERBALIZES UNDERSTANDING .Adonis Housekeeper

## 2016-12-15 ENCOUNTER — Encounter: Payer: Self-pay | Admitting: Adult Health

## 2016-12-15 ENCOUNTER — Ambulatory Visit (INDEPENDENT_AMBULATORY_CARE_PROVIDER_SITE_OTHER): Payer: Medicare Other | Admitting: Adult Health

## 2016-12-15 VITALS — Ht 60.0 in | Wt 104.2 lb

## 2016-12-15 DIAGNOSIS — Z8673 Personal history of transient ischemic attack (TIA), and cerebral infarction without residual deficits: Secondary | ICD-10-CM | POA: Diagnosis not present

## 2016-12-15 DIAGNOSIS — R413 Other amnesia: Secondary | ICD-10-CM

## 2016-12-15 NOTE — Patient Instructions (Signed)
Memory score is slightly declined We will continue to monitor If your symptoms worsen or you develop new symptoms please let us know.

## 2016-12-15 NOTE — Progress Notes (Signed)
[name]: Erin [name] DOB: Jul 10, 1920  REASON FOR VISIT: follow up - history of stroke and memory disturbance HISTORY FROM: [name]  HISTORY OF PRESENT ILLNESS: Today 12/15/2016: Erin [name] is a 83 year old female with a history of stroke and memory disturbance. She returns today for follow-up. She states that overall she is doing well. She does notice changes with her memory. She lives at home with her daughter. She does require some assistance with dressing but can complete most ADLs with minimal or no assistance. Her daughter prepares all meals for her. She has a friend that manages her finances. She states that since her fall in April 3 years ago she has required more assistance in Erin home. She does have ongoing right hip pain for which she receives injections. She is currently not on any medication for her memory. She remains on aspirin for stroke prevention. She returns today for an evaluation.  HISTORY 06/14/16: Erin [name] is a 83 year old female with a history of stroke and mild memory disturbance. She returns today for follow-up. She continues to live at home with her daughter. She is able to complete all ADLs independently. She remains on aspirin for stroke prevention. Primary care manages her blood pressure and cholesterol. She denies any stroke like symptoms. She does not operate a motor vehicle. States that she sleeps well at night. Good appetite. Continue to have left hip pain. Will have injection next week. She returns today for an evaluation.  HISTORY 12/10/15: Erin [name] is a 83 year old female with a history of stroke and mild memory disturbance. She returns today for an evaluation. Erin [name] reveals that her memory has remained stable for her age. She states that she lives at her home with her daughter. She reports that she is able to complete all ADLs independently. She also reports that she tends to do some of Erin household chores herself. She does not operate a motor  vehicle. Her primary care manages her blood pressure and cholesterol. She continues on aspirin for stroke prevention. [name] reports that she had a fall and since then she's been having pain in Erin knees and hips. She states that she recently received injections in Erin hips for this discomfort. She denies any new neurological symptoms. She returns today for an evaluation.  HISTORY  Update 06/09/2015:She returns for follow-up after last visit 6 months ago. She continues to have mild short-term memory difficulties which appear to have slightly decline. She gets occasional confusion. She had her pacemaker replaced in October. She also had her thyroid medication dose changed. She has not been participating in activities for cognitively challenge  HISTORY: Erin [name] is a 75 year pleasant Caucasian lady with admission to [facility] for stroke on 06/01/09. She was admitted with sudden onset left hemiparesis but presented beyond time window for intervention. Initial Ct head was unremarkable. MRI could not be done due to having a pacemaker. CT angio showed 50-60% subclavian stenosis but no significant carotid or intracranial stenosis. Rt corona radiata white matter infarct was noted on repeat CT head.Cholestrol was 233 with LDL 160.HbA1c was 5.7 She was sent to short term SNF and received PT/OT and went home with home health and finished out [name] therapies and has done very well. She was changed from 81 to 325 mg Aspirin   REVIEW OF SYSTEMS: Out of a complete 14 system review of symptoms, Erin [name] complains only of Erin following symptoms, and all other reviewed systems are negative.  Joint pain, walking  difficulty, daytime sleepiness, confusion, memory loss, heat intolerance, hearing loss  ALLERGIES: Allergies  Allergen Reactions  . Toprol Xl [Metoprolol Succinate] Other (See Comments)    UNKNOWN     HOME MEDICATIONS: Outpatient Medications Prior to Visit  Medication Sig Dispense  Refill  . aspirin 325 MG tablet Take 325 mg by mouth daily.    . Calcium Carb-Cholecalciferol (CALCIUM 1000 + D PO) Take 1,000 Units by mouth daily.     . calcium carbonate (TUMS EX) 750 MG chewable tablet Chew 2 tablets by mouth daily.     . chlordiazePOXIDE (LIBRIUM) 10 MG capsule Take 10 mg by mouth daily.     Marland Kitchen CINNAMON PO Take 1,000 mg by mouth 2 (two) times daily.    . Coenzyme Q10 (COQ-10) 400 MG CAPS Take 400 tablets by mouth daily.     Marland Kitchen CRANBERRY FRUIT PO Take 2,000 mg by mouth 2 (two) times daily.     . Fish Oil-Cholecalciferol (FISH OIL + D3 PO) Take by mouth.    . furosemide (LASIX) 20 MG tablet Take 1 tablet (20 mg total) by mouth daily. 90 tablet 3  . Green Tea, Camillia sinensis, ([employer] GREEN TEA EXTRACT PO) Take 2 tablets by mouth 2 (two) times daily.     Marland Kitchen levothyroxine (SYNTHROID, LEVOTHROID) 50 MCG tablet Take 50 mcg by mouth daily before breakfast.    . levothyroxine (SYNTHROID, LEVOTHROID) 75 MCG tablet Take 75 mcg by mouth daily before breakfast.    . Misc Natural Products (OSTEO BI-FLEX ADV JOINT SHIELD PO) Take 1 tablet by mouth daily.    . Omega-3 Fatty Acids (FISH OIL PO) Take 2 capsules by mouth daily.    . simvastatin (ZOCOR) 20 MG tablet TAKE 1 TABLET (20 MG TOTAL) BY MOUTH DAILY. 90 tablet 0   No facility-administered medications prior to visit.     PAST MEDICAL HISTORY: Past Medical History:  Diagnosis Date  . Anxiety   . BBB (bundle branch block)   . Breast cancer ([place])   . Carotid artery plaque   . Diastolic CHF ([place])   . DVT (deep venous thrombosis) ([place])   . H/O: hysterectomy   . HTN (hypertension)   . Hyperlipidemia   . Hypothyroidism   . Stroke [facility])     PAST SURGICAL HISTORY: Past Surgical History:  Procedure Laterality Date  . ABDOMINAL HYSTERECTOMY    . BREAST SURGERY    . CARDIAC CATHETERIZATION  2007  . CARPAL TUNNEL RELEASE    . cataracts    . MASTECTOMY    . PACEMAKER GENERATOR CHANGE  08/08/2014   MDT [name] dual chamber  pacemaker generator change by Dr Lovena Le  . PACEMAKER INSERTION    . PERMANENT PACEMAKER GENERATOR CHANGE N/A 08/07/2014   Procedure: PERMANENT PACEMAKER GENERATOR CHANGE;  Surgeon: Evans Lance, MD;  Location[facility] C[facility] LAB;  Service: Cardiovascular;  Laterality: N/A;    FAMILY HISTORY: Family History  Problem Relation Age of Onset  . Unexplained death Mother 76  . Cancer Mother   . Hyperlipidemia Mother   . Stroke Mother   . Unexplained death Father 20    SOCIAL HISTORY: Social History   Social History  . Marital status: Widowed    Spouse name: N/A  . Number of children: N/A  . Years of education: 12th   Occupational History  . Retired    Social History Main Topics  . Smoking status: Never Smoker  . Smokeless tobacco: Never Used  . Alcohol use No  .  Drug use: No  . Sexual activity: Not on file   Other Topics Concern  . Not on file   Social History Narrative   [name] is a widow.    [name] lives with daughter.   [name] has 2 adopted children.   [name] is retired.   [name] is right handed.   [name] has a high school education.               PHYSICAL EXAM  Vitals:   12/15/16 1358  Weight: 104 lb 3.2 oz (47.3 kg)  Height: 5' (1.524 m)   Body mass index is 20.35 kg/m.   MMSE - Mini Mental State Exam 12/15/2016 06/14/2016 12/10/2015  Orientation to time 4 5 4   Orientation to Place 4 4 4   Registration 3 3 3   Attention/ Calculation 2 5 3   Recall 0 0 1  Language- name 2 objects 2 2 2   Language- repeat 1 1 1   Language- follow 3 step command 3 3 3   Language- read & follow direction 1 1 0  Write a sentence 1 1 1   Copy design 0 0 1  Total score 21 25 23      Generalized: Well developed, in no acute distress   Neurological examination  Mentation: Alert. Follows all commands speech and language fluent Cranial nerve II-XII: Pupils were equal round reactive to light. Extraocular movements were full, visual field were full on confrontational test. Facial  sensation and strength were normal. Uvula tongue midline. Head turning and shoulder shrug  were normal and symmetric. Motor: Erin motor testing reveals 5 over 5 strength In all extremities with Erin exception of 4/5 strength in Erin right lower extremity due to hip discomfort. Good symmetric motor tone is noted throughout.  Sensory: Sensory testing is intact to soft touch on all 4 extremities. No evidence of extinction is noted.  Coordination: Cerebellar testing reveals good finger-nose-finger and heel-to-shin bilaterally.  Gait and station: [name] uses a walker when ambulating. Reflexes: Deep tendon reflexes are symmetric and normal bilaterally.   DIAGNOSTIC DATA (LABS, IMAGING, TESTING) - I reviewed [name] records, labs, notes, testing and imaging myself where available.  Lab Results  Component Value Date   WBC 5.5 08/22/2016   HGB 13.7 08/22/2016   HCT 41.9 08/22/2016   MCV 88.2 08/22/2016   PLT 272 08/22/2016      Component Value Date/Time   NA 137 08/22/2016 1720   K 3.5 08/22/2016 1720   CL 103 08/22/2016 1720   CO2 27 08/22/2016 1720   GLUCOSE 73 08/22/2016 1720   BUN 13 08/22/2016 1720   CREATININE 0.86 08/22/2016 1720   CALCIUM 9.0 08/22/2016 1720   PROT 6.4 06/01/2009 2009   ALBUMIN 3.4 (L) 06/01/2009 2009   AST 25 06/01/2009 2009   ALT 16 06/01/2009 2009   ALKPHOS 57 06/01/2009 2009   BILITOT 0.7 06/01/2009 2009   GFRNONAA 56 (L) 08/22/2016 1720   GFRAA >60 08/22/2016 1720     Lab Results  Component Value Date   VITAMINB12 >1999 (H) 09/02/2014   Lab Results  Component Value Date   TSH 0.389 (L) 09/02/2014      ASSESSMENT AND PLAN 83 y.o. year old female  has a past medical history of Anxiety; BBB (bundle branch block); Breast cancer ([place]); Carotid artery plaque; Diastolic CHF ([facility]); DVT (deep venous thrombosis) ([place]); H/O: hysterectomy; HTN (hypertension); Hyperlipidemia; Hypothyroidism; and Stroke [facility]). here with:  1. History of stroke 2. Memory  disturbance  Erin [name] is doing well.  She will continue on aspirin for stroke prevention. Her memory score has slightly declined. Her MMSE today is 21/30 was previously 25/30. She does not wish to start any memory medication at this time. She is advised that if her symptoms worsen or she develops new symptoms she should let us know. She will follow-up in 6 months or sooner if needed.     Ward Givens, MSN, NP-C 12/15/2016, 2:19 PM [facility] [address], [place], [place] [address] [phone]

## 2016-12-15 NOTE — Progress Notes (Signed)
I have read the note, and I agree with the clinical assessment and plan.  Richard A. Sater, MD, PhD Certified in Neurology, Clinical Neurophysiology, Sleep Medicine, Pain Medicine and Neuroimaging  [facility] [address], [address] [place],  [address] [phone]  

## 2016-12-26 ENCOUNTER — Ambulatory Visit (INDEPENDENT_AMBULATORY_CARE_PROVIDER_SITE_OTHER): Payer: Medicare Other | Admitting: Internal Medicine

## 2016-12-26 ENCOUNTER — Encounter: Payer: Self-pay | Admitting: Internal Medicine

## 2016-12-26 VITALS — BP 130/68 | HR 68 | Ht 60.0 in | Wt 103.0 lb

## 2016-12-26 DIAGNOSIS — Z950 Presence of cardiac pacemaker: Secondary | ICD-10-CM | POA: Diagnosis not present

## 2016-12-26 DIAGNOSIS — I442 Atrioventricular block, complete: Secondary | ICD-10-CM | POA: Diagnosis not present

## 2016-12-26 NOTE — Patient Instructions (Addendum)
Medication Instructions:  Your physician recommends that you continue on your current medications as directed. Please refer to the Current Medication list given to you today.   Labwork: None Ordered   Testing/Procedures: None Ordered   Follow-Up: Your physician wants you to follow-up in: 1 year with Dr. Lovena Le. You will receive a reminder letter in the mail two months in advance. If you don't receive a letter, please call our office to schedule the follow-up appointment.  Remote monitoring is used to monitor your Pacemaker from home. This monitoring reduces the number of office visits required to check your device to one time per year. It allows [place] to keep an eye on the functioning of your device to ensure it is working properly. You are scheduled for a device check from home on 03/27/17. You may send your transmission at any time that day. If you have a wireless device, the transmission will be sent automatically. After your physician reviews your transmission, you will receive a postcard with your next transmission date.    Any Other Special Instructions Will Be Listed Below (If Applicable).     If you need a refill on your cardiac medications before your next appointment, please call your pharmacy.

## 2016-12-26 NOTE — Progress Notes (Signed)
HPI Erin [name] is a very pleasant 83 year old woman with a history of complete heart block, status post permanent pacemaker insertion, hypertension, and dyslipidemia. In the interim, she has been stable. She denies chest pain, or shortness of breath. No syncope or peripheral edema. No cough or hemoptysis. She has had problems with arthritis and pain in her right leg. She has had several injections but is still in pain.  Allergies  Allergen Reactions  . Toprol Xl [Metoprolol Succinate] Other (See Comments)    UNKNOWN      Current Outpatient Prescriptions  Medication Sig Dispense Refill  . aspirin 325 MG tablet Take 325 mg by mouth daily.    . Calcium Carb-Cholecalciferol (CALCIUM 1000 + D PO) Take 1,000 Units by mouth daily.     . calcium carbonate (TUMS EX) 750 MG chewable tablet Chew 2 tablets by mouth daily.     . chlordiazePOXIDE (LIBRIUM) 10 MG capsule Take 10 mg by mouth daily.     Marland Kitchen CINNAMON PO Take 1,000 mg by mouth 2 (two) times daily.    . Coenzyme Q10 (COQ-10) 400 MG CAPS Take 400 tablets by mouth daily.     Marland Kitchen CRANBERRY FRUIT PO Take 2,000 mg by mouth 2 (two) times daily.     . Fish Oil-Cholecalciferol (FISH OIL + D3 PO) Take by mouth.    . furosemide (LASIX) 20 MG tablet Take 1 tablet (20 mg total) by mouth daily. 90 tablet 3  . Green Tea, Camillia sinensis, ([employer] GREEN TEA EXTRACT PO) Take 2 tablets by mouth 2 (two) times daily.     Marland Kitchen levothyroxine (SYNTHROID, LEVOTHROID) 50 MCG tablet Take 50 mcg by mouth daily before breakfast. Takes every other day.  Alternates with the other.    . levothyroxine (SYNTHROID, LEVOTHROID) 75 MCG tablet Take 75 mcg by mouth daily before breakfast.    . Misc Natural Products (OSTEO BI-FLEX ADV JOINT SHIELD PO) Take 1 tablet by mouth daily.    . Omega-3 Fatty Acids (FISH OIL PO) Take 2 capsules by mouth daily.    . simvastatin (ZOCOR) 20 MG tablet TAKE 1 TABLET (20 MG TOTAL) BY MOUTH DAILY. 90 tablet 0   No current facility-administered  medications for this visit.      Past Medical History:  Diagnosis Date  . Anxiety   . BBB (bundle branch block)   . Breast cancer ([place])   . Carotid artery plaque   . Diastolic CHF ([place])   . DVT (deep venous thrombosis) ([place])   . H/O: hysterectomy   . HTN (hypertension)   . Hyperlipidemia   . Hypothyroidism   . Stroke ([place])     ROS:   All systems reviewed and negative except as noted in the HPI.   Past Surgical History:  Procedure Laterality Date  . ABDOMINAL HYSTERECTOMY    . BREAST SURGERY    . CARDIAC CATHETERIZATION  2007  . CARPAL TUNNEL RELEASE    . cataracts    . MASTECTOMY    . PACEMAKER GENERATOR CHANGE  08/08/2014   MDT [name] dual chamber pacemaker generator change by Dr Lovena Le  . PACEMAKER INSERTION    . PERMANENT PACEMAKER GENERATOR CHANGE N/A 08/07/2014   Procedure: PERMANENT PACEMAKER GENERATOR CHANGE;  Surgeon: Evans Lance, MD;  Location[facility] C[facility] LAB;  Service: Cardiovascular;  Laterality: N/A;     Family History  Problem Relation Age of Onset  . Unexplained death Mother 60  . Cancer Mother   . Hyperlipidemia Mother   .  Stroke Mother   . Unexplained death Father 80     Social History   Social History  . Marital status: Widowed    Spouse name: N/A  . Number of children: N/A  . Years of education: 12th   Occupational History  . Retired    Social History Main Topics  . Smoking status: Former Smoker    Packs/day: 0.75    Types: Cigarettes  . Smokeless tobacco: Never Used  . Alcohol use No  . Drug use: No  . Sexual activity: Not on file   Other Topics Concern  . Not on file   Social History Narrative   Patient is a widow.    Patient lives with daughter.   Patient has 2 adopted children.   Patient is retired.   Patient is right handed.   Patient has a high school education.              BP 130/68   Pulse 68   Ht 5' (1.524 m)   Wt 103 lb (46.7 kg)   BMI 20.12 kg/m   Physical Exam:  Frail, but Well appearing  elderly woman,NAD HEENT: Unremarkable Neck:  6 cm JVD, no thyromegally Lungs:  Clear with no wheezes, rales, or rhonchi. Well healed PM incision. HEART:  Regular rate rhythm, no murmurs, no rubs, no clicks Abd:  soft, positive bowel sounds, no organomegally, no rebound, no guarding Ext:  2 plus pulses, no edema, no cyanosis, no clubbing Skin:  No rashes no nodules Neuro:  CN II through XII intact, motor grossly intact  DEVICE  Normal device function.  See PaceArt for details.   Assess/Plan: 1. Complete heart block - she is stable s/p PPM insertion 2. HTN - her blood pressure is well controlled, though a bit elevated in the office today but she is in pain.  3. PPM - her medtronic DDD PM is working normally. Will recheck in several months. 4. Chest pain - does not sound cardiac in etiology. I have recommended she undergo watchful waiting.  Erin [name],M.D.  Mikle Bosworth.D.

## 2016-12-27 LAB — CUP PACEART INCLINIC DEVICE CHECK
Battery Remaining Longevity: 106 mo
Brady Statistic AS VS Percent: 0 %
Date Time Interrogation Session: 20180219213628
Implantable Lead Implant Date: 20070316
Implantable Lead Location: 753860
Implantable Lead Model: 4469
Implantable Lead Serial Number: 478729
Implantable Pulse Generator Implant Date: 20150930
Lead Channel Impedance Value: 455 Ohm
Lead Channel Impedance Value: 582 Ohm
Lead Channel Pacing Threshold Amplitude: 0.5 V
Lead Channel Pacing Threshold Pulse Width: 0.4 ms
Lead Channel Setting Pacing Amplitude: 2 V
Lead Channel Setting Pacing Pulse Width: 0.4 ms
Lead Channel Setting Sensing Sensitivity: 4 mV
MDC IDC LEAD IMPLANT DT: 20070316
MDC IDC LEAD LOCATION: 753859
MDC IDC LEAD SERIAL: 523845
MDC IDC MSMT BATTERY IMPEDANCE: 202 Ohm
MDC IDC MSMT BATTERY VOLTAGE: 2.79 V
MDC IDC MSMT LEADCHNL RA PACING THRESHOLD AMPLITUDE: 0.5 V
MDC IDC MSMT LEADCHNL RA PACING THRESHOLD PULSEWIDTH: 0.4 ms
MDC IDC MSMT LEADCHNL RA SENSING INTR AMPL: 4 mV
MDC IDC SET LEADCHNL RV PACING AMPLITUDE: 2.5 V
MDC IDC STAT BRADY AP VP PERCENT: 1 %
MDC IDC STAT BRADY AP VS PERCENT: 0 %
MDC IDC STAT BRADY AS VP PERCENT: 99 %

## 2017-03-27 ENCOUNTER — Ambulatory Visit (INDEPENDENT_AMBULATORY_CARE_PROVIDER_SITE_OTHER): Payer: Medicare Other | Admitting: *Deleted

## 2017-03-27 DIAGNOSIS — I442 Atrioventricular block, complete: Secondary | ICD-10-CM

## 2017-03-28 LAB — CUP PACEART REMOTE DEVICE CHECK
Battery Impedance: 227 Ohm
Battery Voltage: 2.79 V
Brady Statistic AP VP Percent: 4 %
Brady Statistic AP VS Percent: 0 %
Brady Statistic AS VS Percent: 0 %
Date Time Interrogation Session: 20180521185100
Implantable Lead Implant Date: 20070316
Implantable Lead Location: 753859
Implantable Lead Model: 4469
Implantable Lead Serial Number: 478729
Implantable Lead Serial Number: 523845
Lead Channel Impedance Value: 450 Ohm
Lead Channel Impedance Value: 582 Ohm
Lead Channel Pacing Threshold Amplitude: 0.625 V
Lead Channel Pacing Threshold Pulse Width: 0.4 ms
Lead Channel Setting Pacing Amplitude: 2.5 V
Lead Channel Setting Pacing Pulse Width: 0.4 ms
MDC IDC LEAD IMPLANT DT: 20070316
MDC IDC LEAD LOCATION: 753860
MDC IDC MSMT BATTERY REMAINING LONGEVITY: 103 mo
MDC IDC MSMT LEADCHNL RA PACING THRESHOLD AMPLITUDE: 0.375 V
MDC IDC MSMT LEADCHNL RV PACING THRESHOLD PULSEWIDTH: 0.4 ms
MDC IDC PG IMPLANT DT: 20150930
MDC IDC SET LEADCHNL RA PACING AMPLITUDE: 2 V
MDC IDC SET LEADCHNL RV SENSING SENSITIVITY: 4 mV
MDC IDC STAT BRADY AS VP PERCENT: 96 %

## 2017-03-28 NOTE — Progress Notes (Signed)
Remote pacemaker transmission.   

## 2017-03-29 ENCOUNTER — Encounter: Payer: Self-pay | Admitting: Cardiology

## 2017-05-20 ENCOUNTER — Telehealth: Payer: Self-pay | Admitting: Cardiology

## 2017-05-20 NOTE — Telephone Encounter (Signed)
Daughter called stating that her mother has been having swelling on the right side of her body with her fingers, arm and leg but not the left side.  I have instructed her to come in to the ER to be evaluated and daughter agrees.

## 2017-05-22 ENCOUNTER — Telehealth: Payer: Self-pay | Admitting: Internal Medicine

## 2017-05-22 ENCOUNTER — Encounter: Payer: Self-pay | Admitting: Internal Medicine

## 2017-05-22 ENCOUNTER — Ambulatory Visit (INDEPENDENT_AMBULATORY_CARE_PROVIDER_SITE_OTHER): Payer: Medicare Other | Admitting: Internal Medicine

## 2017-05-22 VITALS — BP 120/62 | HR 60 | Ht 60.0 in | Wt 102.4 lb

## 2017-05-22 DIAGNOSIS — I442 Atrioventricular block, complete: Secondary | ICD-10-CM | POA: Diagnosis not present

## 2017-05-22 DIAGNOSIS — Z950 Presence of cardiac pacemaker: Secondary | ICD-10-CM | POA: Diagnosis not present

## 2017-05-22 NOTE — Progress Notes (Signed)
HPI Erin [name] is a very pleasant 83 year old woman with a history of complete heart block, status post permanent pacemaker insertion, hypertension, and dyslipidemia. In the interim, she has had right arm swelling as well as her usual knee and hip pain. She has arthritis.  Allergies  Allergen Reactions  . Toprol Xl [Metoprolol Succinate] Other (See Comments)    UNKNOWN      Current Outpatient Prescriptions  Medication Sig Dispense Refill  . aspirin 325 MG tablet Take 325 mg by mouth daily.    . Calcium Carb-Cholecalciferol (CALCIUM 1000 + D PO) Take 1,000 Units by mouth daily.     . calcium carbonate (TUMS EX) 750 MG chewable tablet Chew 2 tablets by mouth daily.     . chlordiazePOXIDE (LIBRIUM) 10 MG capsule Take 10 mg by mouth daily.     Marland Kitchen CINNAMON PO Take 1,000 mg by mouth 2 (two) times daily.    . Coenzyme Q10 (COQ-10) 400 MG CAPS Take 400 tablets by mouth daily.     Marland Kitchen CRANBERRY FRUIT PO Take 2,000 mg by mouth 2 (two) times daily.     . Fish Oil-Cholecalciferol (FISH OIL + D3 PO) Take 1 capsule by mouth 2 (two) times daily.     . furosemide (LASIX) 20 MG tablet Take 1 tablet (20 mg total) by mouth daily. 90 tablet 3  . Green Tea, Camillia sinensis, ([employer] GREEN TEA EXTRACT PO) Take 2 tablets by mouth 2 (two) times daily.     Marland Kitchen levothyroxine (SYNTHROID, LEVOTHROID) 50 MCG tablet Take 50 mcg by mouth daily before breakfast. Takes every other day.  Alternates with the other.    . levothyroxine (SYNTHROID, LEVOTHROID) 75 MCG tablet Take 75 mcg by mouth daily before breakfast.    . Misc Natural Products (OSTEO BI-FLEX ADV JOINT SHIELD PO) Take 1 tablet by mouth daily.    . simvastatin (ZOCOR) 20 MG tablet TAKE 1 TABLET (20 MG TOTAL) BY MOUTH DAILY. 90 tablet 0   No current facility-administered medications for this visit.      Past Medical History:  Diagnosis Date  . Anxiety   . BBB (bundle branch block)   . Breast cancer ([place])   . Carotid artery plaque   . Diastolic CHF ([place])   .  DVT (deep venous thrombosis) ([place])   . H/O: hysterectomy   . HTN (hypertension)   . Hyperlipidemia   . Hypothyroidism   . Stroke ([place])     ROS:   All systems reviewed and negative except as noted in the HPI.   Past Surgical History:  Procedure Laterality Date  . ABDOMINAL HYSTERECTOMY    . BREAST SURGERY    . CARDIAC CATHETERIZATION  2007  . CARPAL TUNNEL RELEASE    . cataracts    . MASTECTOMY    . PACEMAKER GENERATOR CHANGE  08/08/2014   MDT [name] dual chamber pacemaker generator change by Dr Lovena Le  . PACEMAKER INSERTION    . PERMANENT PACEMAKER GENERATOR CHANGE N/A 08/07/2014   Procedure: PERMANENT PACEMAKER GENERATOR CHANGE;  Surgeon: Evans Lance, MD;  Location: [facility] CATH LAB;  Service: Cardiovascular;  Laterality: N/A;     Family History  Problem Relation Age of Onset  . Unexplained death Mother 68  . Cancer Mother   . Hyperlipidemia Mother   . Stroke Mother   . Unexplained death Father 80     Social History   Social History  . Marital status: Widowed    Spouse name: N/A  . Number  of children: N/A  . Years of education: 12th   Occupational History  . Retired    Social History Main Topics  . Smoking status: Former Smoker    Packs/day: 0.75    Types: Cigarettes  . Smokeless tobacco: Never Used  . Alcohol use No  . Drug use: No  . Sexual activity: Not on file   Other Topics Concern  . Not on file   Social History Narrative   Patient is a widow.    Patient lives with daughter.   Patient has 2 adopted children.   Patient is retired.   Patient is right handed.   Patient has a high school education.              BP 120/62   Pulse 60   Ht 5' (1.524 m)   Wt 102 lb 6.4 oz (46.4 kg)   SpO2 92%   BMI 20.00 kg/m   Physical Exam:  Frail, but Well appearing elderly woman,NAD HEENT: Unremarkable Neck:  6 cm JVD, no thyromegally Lungs:  Clear with no wheezes, rales, or rhonchi. Well healed PM incision. HEART:  Regular rate rhythm, no  murmurs, no rubs, no clicks Abd:  soft, positive bowel sounds, no organomegally, no rebound, no guarding Ext:  2 plus pulses, no edema, no cyanosis, no clubbing, right sided PM incision is well healed.  Skin:  No rashes no nodules Neuro:  CN II through XII intact, motor grossly intact  DEVICE  Normal device function.  See PaceArt for details.   Assess/Plan: 1. Complete heart block - she is stable s/p PPM insertion 2. HTN - her blood pressure is well controlled, though a bit elevated in the office today but she is in pain.  3. PPM - her medtronic DDD PM is working normally. Will recheck in several months. Her right arm is swollen and I have asked her to keep her arm elevated. I have explained that her veins will have to learn a new route for blood to go to get back to the heart. She is not a candidate for systemic anti-coagulation and ultrasound of the arm is notoriously inaccurate for diagnosing DVT caused by a PPM.  4. Chest pain - does not sound cardiac in etiology. I have recommended she undergo watchful waiting.  Mikle Bosworth.D.

## 2017-05-22 NOTE — Patient Instructions (Addendum)
Medication Instructions:  Your physician recommends that you continue on your current medications as directed. Please refer to the Current Medication list given to you today.   Labwork: None Ordered   Testing/Procedures: None Ordered   Follow-Up: Your physician wants you to follow-up in: February 2019. You will receive a reminder letter in the mail two months in advance. If you don't receive a letter, please call our office to schedule the follow-up appointment..   Remote monitoring is used to monitor your Pacemaker from home. This monitoring reduces the number of office visits required to check your device to one time per year. It allows [place] to keep an eye on the functioning of your device to ensure it is working properly. You are scheduled for a device check from home on  06/26/17 . You may send your transmission at any time that day. If you have a wireless device, the transmission will be sent automatically. After your physician reviews your transmission, you will receive a postcard with your next transmission date.    Any Other Special Instructions Will Be Listed Below (If Applicable). Keep right arm elevated. Prop right arm higher than level of heart. You may use a couple of pillows under right arm to elevate.    If you need a refill on your cardiac medications before your next appointment, please call your pharmacy.

## 2017-06-20 ENCOUNTER — Ambulatory Visit: Payer: Medicare Other | Admitting: Neurology

## 2017-06-26 ENCOUNTER — Ambulatory Visit (INDEPENDENT_AMBULATORY_CARE_PROVIDER_SITE_OTHER): Payer: Medicare Other | Admitting: *Deleted

## 2017-06-26 DIAGNOSIS — I442 Atrioventricular block, complete: Secondary | ICD-10-CM | POA: Diagnosis not present

## 2017-06-27 NOTE — Progress Notes (Signed)
Remote pacemaker transmission.   

## 2017-06-28 LAB — CUP PACEART REMOTE DEVICE CHECK
Brady Statistic AP VP Percent: 4 %
Brady Statistic AP VS Percent: 0 %
Brady Statistic AS VS Percent: 0 %
Implantable Lead Implant Date: 20070316
Implantable Lead Implant Date: 20070316
Implantable Lead Location: 753859
Implantable Lead Location: 753860
Implantable Lead Model: 4469
Implantable Pulse Generator Implant Date: 20150930
Lead Channel Impedance Value: 455 Ohm
Lead Channel Impedance Value: 591 Ohm
Lead Channel Pacing Threshold Amplitude: 0.625 V
Lead Channel Pacing Threshold Pulse Width: 0.4 ms
Lead Channel Setting Pacing Pulse Width: 0.4 ms
Lead Channel Setting Sensing Sensitivity: 4 mV
MDC IDC LEAD SERIAL: 478729
MDC IDC LEAD SERIAL: 523845
MDC IDC MSMT BATTERY IMPEDANCE: 277 Ohm
MDC IDC MSMT BATTERY REMAINING LONGEVITY: 98 mo
MDC IDC MSMT BATTERY VOLTAGE: 2.79 V
MDC IDC MSMT LEADCHNL RA PACING THRESHOLD AMPLITUDE: 0.375 V
MDC IDC MSMT LEADCHNL RV PACING THRESHOLD PULSEWIDTH: 0.4 ms
MDC IDC SESS DTM: 20180820142006
MDC IDC SET LEADCHNL RA PACING AMPLITUDE: 2 V
MDC IDC SET LEADCHNL RV PACING AMPLITUDE: 2.5 V
MDC IDC STAT BRADY AS VP PERCENT: 96 %

## 2017-07-05 ENCOUNTER — Encounter: Payer: Self-pay | Admitting: Neurology

## 2017-07-05 ENCOUNTER — Ambulatory Visit (INDEPENDENT_AMBULATORY_CARE_PROVIDER_SITE_OTHER): Payer: Medicare Other | Admitting: Neurology

## 2017-07-05 VITALS — BP 131/64 | HR 68 | Ht 60.0 in | Wt 101.6 lb

## 2017-07-05 DIAGNOSIS — G301 Alzheimer's disease with late onset: Secondary | ICD-10-CM

## 2017-07-05 DIAGNOSIS — F0280 Dementia in other diseases classified elsewhere without behavioral disturbance: Secondary | ICD-10-CM

## 2017-07-05 NOTE — Progress Notes (Signed)
PATIENT: Erin [name] DOB: May 08, 1920  REASON FOR VISIT: follow up - history of stroke and memory disturbance HISTORY FROM: patient  HISTORY OF PRESENT ILLNESS: Today 12/15/2016: Erin [name] is a 83 year old female with a history of stroke and memory disturbance. She returns today for follow-up. She states that overall she is doing well. She does notice changes with her memory. She lives at home with her daughter. She does require some assistance with dressing but can complete most ADLs with minimal or no assistance. Her daughter prepares all meals for her. She has a friend that manages her finances. She states that since her fall in April 3 years ago she has required more assistance in the home. She does have ongoing right hip pain for which she receives injections. She is currently not on any medication for her memory. She remains on aspirin for stroke prevention. She returns today for an evaluation.  HISTORY 06/14/16: Erin [name] is a 83 year old female with a history of stroke and mild memory disturbance. She returns today for follow-up. She continues to live at home with her daughter. She is able to complete all ADLs independently. She remains on aspirin for stroke prevention. Primary care manages her blood pressure and cholesterol. She denies any stroke like symptoms. She does not operate a motor vehicle. States that she sleeps well at night. Good appetite. Continue to have left hip pain. Will have injection next week. She returns today for an evaluation.  HISTORY 12/10/15: Erin [name] is a 83 year old female with a history of stroke and mild memory disturbance. She returns today for an evaluation. The patient reveals that her memory has remained stable for her age. She states that she lives at her home with her daughter. She reports that she is able to complete all ADLs independently. She also reports that she tends to do some of the household chores herself. She does not operate a motor  vehicle. Her primary care manages her blood pressure and cholesterol. She continues on aspirin for stroke prevention. Patient reports that she had a fall and since then she's been having pain in the knees and hips. She states that she recently received injections in the hips for this discomfort. She denies any new neurological symptoms. She returns today for an evaluation.  HISTORY  Update 06/09/2015:She returns for follow-up after last visit 6 months ago. She continues to have mild short-term memory difficulties which appear to have slightly decline. She gets occasional confusion. She had her pacemaker replaced in October. She also had her thyroid medication dose changed. She has not been participating in activities for cognitively challenge  HISTORY: Erin [name] is a 74 year pleasant Caucasian lady with admission to [facility] for stroke on 06/01/09. She was admitted with sudden onset left hemiparesis but presented beyond time window for intervention. Initial Ct head was unremarkable. MRI could not be done due to having a pacemaker. CT angio showed 50-60% subclavian stenosis but no significant carotid or intracranial stenosis. Rt corona radiata white matter infarct was noted on repeat CT head.Cholestrol was 233 with LDL 160.HbA1c was 5.7 She was sent to short term SNF and received PT/OT and went home with home health and finished out patient therapies and has done very well. She was changed from 81 to 325 mg Aspirin Update 07/05/2017 ;  She returns for follow-up after last visit 6 months ago. She is accompanied by her daughter. Patient continues to have mild memory and cognitive difficulties which appear to be  unchanged. She lives with her daughter. She can be left alone for a few hours. She is mostly independent in her activities and can do most things for herself except changing clothes and bedding. She in fact still writes her own checks. Patient continues to refuse medications like Aricept  and Namenda due to fear of side effects. Patient is bothered mostly by hip pain which limits her ambulation. She uses a walker and she is careful and has not had any falls or injuries. She has refused hip surgery which has been suggested. She remains on aspirin 81 mg which is tolerating well without bruising or bleeding.  REVIEW OF SYSTEMS: Out of a complete 14 system review of symptoms, the patient complains only of the following symptoms, and all other reviewed systems are negative.  Joint pain, walking difficulty,   confusion, memory loss, depression, hearing loss  ALLERGIES: Allergies  Allergen Reactions  . Toprol Xl [Metoprolol Succinate] Other (See Comments)    UNKNOWN     HOME MEDICATIONS: Outpatient Medications Prior to Visit  Medication Sig Dispense Refill  . Calcium Carb-Cholecalciferol (CALCIUM 1000 + D PO) Take 1,000 Units by mouth daily.     . calcium carbonate (TUMS EX) 750 MG chewable tablet Chew 2 tablets by mouth daily.     . chlordiazePOXIDE (LIBRIUM) 10 MG capsule Take 10 mg by mouth daily.     Marland Kitchen CINNAMON PO Take 1,000 mg by mouth 2 (two) times daily.    . Coenzyme Q10 (COQ-10) 400 MG CAPS Take 400 tablets by mouth daily.     Marland Kitchen CRANBERRY FRUIT PO Take 2,000 mg by mouth 2 (two) times daily.     . Fish Oil-Cholecalciferol (FISH OIL + D3 PO) Take 1 capsule by mouth 2 (two) times daily.     . furosemide (LASIX) 20 MG tablet Take 1 tablet (20 mg total) by mouth daily. 90 tablet 3  . Green Tea, Camillia sinensis, ([employer] GREEN TEA EXTRACT PO) Take 2 tablets by mouth 2 (two) times daily.     Marland Kitchen levothyroxine (SYNTHROID, LEVOTHROID) 50 MCG tablet Take 50 mcg by mouth daily before breakfast. Takes every other day.  Alternates with the other.    . levothyroxine (SYNTHROID, LEVOTHROID) 75 MCG tablet Take 75 mcg by mouth daily before breakfast.    . Misc Natural Products (OSTEO BI-FLEX ADV JOINT SHIELD PO) Take 1 tablet by mouth daily.    . simvastatin (ZOCOR) 20 MG tablet TAKE 1  TABLET (20 MG TOTAL) BY MOUTH DAILY. 90 tablet 0  . aspirin 325 MG tablet Take 325 mg by mouth daily.     No facility-administered medications prior to visit.     PAST MEDICAL HISTORY: Past Medical History:  Diagnosis Date  . Anxiety   . BBB (bundle branch block)   . Breast cancer ([place])   . Carotid artery plaque   . Diastolic CHF ([place])   . DVT (deep venous thrombosis) ([place])   . H/O: hysterectomy   . HTN (hypertension)   . Hyperlipidemia   . Hypothyroidism   . Memory loss   . Stroke [facility])     PAST SURGICAL HISTORY: Past Surgical History:  Procedure Laterality Date  . ABDOMINAL HYSTERECTOMY    . BREAST SURGERY    . CARDIAC CATHETERIZATION  2007  . CAROTID ENDARTERECTOMY    . CARPAL TUNNEL RELEASE    . cataracts    . MASTECTOMY    . PACEMAKER GENERATOR CHANGE  08/08/2014   MDT [name] dual chamber  pacemaker generator change by Dr Lovena Le  . PACEMAKER INSERTION    . PERMANENT PACEMAKER GENERATOR CHANGE N/A 08/07/2014   Procedure: PERMANENT PACEMAKER GENERATOR CHANGE;  Surgeon: Evans Lance, MD;  Location[facility] C[facility] LAB;  Service: Cardiovascular;  Laterality: N/A;    FAMILY HISTORY: Family History  Problem Relation Age of Onset  . Unexplained death Mother 55  . Cancer Mother   . Hyperlipidemia Mother   . Stroke Mother   . Unexplained death Father 9    SOCIAL HISTORY: Social History   Social History  . Marital status: Widowed    Spouse name: N/A  . Number of children: N/A  . Years of education: 12th   Occupational History  . Retired    Social History Main Topics  . Smoking status: Former Smoker    Packs/day: 0.75    Types: Cigarettes  . Smokeless tobacco: Never Used  . Alcohol use No  . Drug use: No  . Sexual activity: Not on file   Other Topics Concern  . Not on file   Social History Narrative   Patient is a widow.    Patient lives with daughter.   Patient has 2 adopted children.   Patient is retired.   Patient is right handed.   Patient has a  high school education.               PHYSICAL EXAM Frail petite elderly Caucasian lady. Severe kyphoscoliosis. Favors right hip due to pain Vitals:   07/05/17 1438  BP: 131/64  Pulse: 68  Weight: 101 lb 9.6 oz (46.1 kg)  Height: 5' (1.524 m)   Body mass index is 19.84 kg/m.   MMSE - Mini Mental State Exam 12/15/2016 06/14/2016 12/10/2015  Orientation to time 4 5 4   Orientation to Place 4 4 4   Registration 3 3 3   Attention/ Calculation 2 5 3   Recall 0 0 1  Language- name 2 objects 2 2 2   Language- repeat 1 1 1   Language- follow 3 step command 3 3 3   Language- read & follow direction 1 1 0  Write a sentence 1 1 1   Copy design 0 0 1  Total score 21 25 23      Generalized: Well developed, in no acute distress   Neurological examination  Mentation: Alert. Follows all commands speech and language fluent.Diminished attention, registration. Recall 0/3. Able to name only 3 animals with 4 legs. Clock drawing 2/4 Cranial nerve II-XII: Pupils were equal round reactive to light. Extraocular movements were full, visual field were full on confrontational test. Facial sensation and strength were normal. Uvula tongue midline. Head turning and shoulder shrug  were normal and symmetric. Motor: The motor testing reveals 5 over 5 strength In all extremities with the exception of 4/5 strength in the right lower extremity due to hip discomfort. Good symmetric motor tone is noted throughout.  Sensory: Sensory testing is intact to soft touch on all 4 extremities. No evidence of extinction is noted.  Coordination: Cerebellar testing reveals good finger-nose-finger and heel-to-shin bilaterally.  Gait and station: Patient uses a walker when ambulating and favors right hip due to pain. Reflexes: Deep tendon reflexes are symmetric and normal bilaterally.   DIAGNOSTIC DATA (LABS, IMAGING, TESTING) - I reviewed patient records, labs, notes, testing and imaging myself where available.  Lab Results    Component Value Date   WBC 5.5 08/22/2016   HGB 13.7 08/22/2016   HCT 41.9 08/22/2016   MCV 88.2 08/22/2016  PLT 272 08/22/2016      Component Value Date/Time   NA 137 08/22/2016 1720   K 3.5 08/22/2016 1720   CL 103 08/22/2016 1720   CO2 27 08/22/2016 1720   GLUCOSE 73 08/22/2016 1720   BUN 13 08/22/2016 1720   CREATININE 0.86 08/22/2016 1720   CALCIUM 9.0 08/22/2016 1720   PROT 6.4 06/01/2009 2009   ALBUMIN 3.4 (L) 06/01/2009 2009   AST 25 06/01/2009 2009   ALT 16 06/01/2009 2009   ALKPHOS 57 06/01/2009 2009   BILITOT 0.7 06/01/2009 2009   GFRNONAA 56 (L) 08/22/2016 1720   GFRAA >60 08/22/2016 1720     Lab Results  Component Value Date   VITAMINB12 >1999 (H) 09/02/2014   Lab Results  Component Value Date   TSH 0.389 (L) 09/02/2014      ASSESSMENT AND PLAN 83 y.o. year old female  has a past medical history of Anxiety; BBB (bundle branch block); Breast cancer ([place]); Carotid artery plaque; Diastolic CHF ([facility]); DVT (deep venous thrombosis) ([place]); H/O: hysterectomy; HTN (hypertension); Hyperlipidemia; Hypothyroidism; Memory loss; and Stroke ([place]). here with:  1. History of stroke 2. Mild alzheimer`s dementia  I had a long discussion with the patient and her daughter regarding her mild dementia which appears to be stable but patient continues to refuse trials of medications like Namenda or Aricept. She is more bothered today by her hip pain and difficulty walking. Continue present treatment. She will return for follow-up in the future only as necessary and no routine scheduled appointment was made    Antony Contras, MD 07/05/2017, 3:16 PM [facility] [address], [place], [place] [address] [phone]

## 2017-07-05 NOTE — Patient Instructions (Signed)
I had a long discussion with the patient and her daughter regarding her mild dementia which appears to be stable but patient continues to refuse trials of medications like Namenda or Aricept. She is more bothered today by her hip pain and difficulty walking. Continue present treatment. She will return for follow-up in the future only as necessary and no routine scheduled appointment was made

## 2017-07-07 ENCOUNTER — Encounter: Payer: Self-pay | Admitting: Cardiology

## 2017-09-25 ENCOUNTER — Ambulatory Visit (INDEPENDENT_AMBULATORY_CARE_PROVIDER_SITE_OTHER): Payer: Medicare Other | Admitting: *Deleted

## 2017-09-25 ENCOUNTER — Telehealth: Payer: Self-pay | Admitting: Cardiology

## 2017-09-25 DIAGNOSIS — I442 Atrioventricular block, complete: Secondary | ICD-10-CM

## 2017-09-25 NOTE — Telephone Encounter (Signed)
Confirmed remote transmission w/ pt daughter.   

## 2017-09-26 NOTE — Progress Notes (Signed)
Remote pacemaker transmission.   

## 2017-09-27 LAB — CUP PACEART REMOTE DEVICE CHECK
Battery Impedance: 277 Ohm
Battery Remaining Longevity: 98 mo
Brady Statistic AP VS Percent: 0 %
Brady Statistic AS VS Percent: 0 %
Date Time Interrogation Session: 20181119212100
Implantable Lead Implant Date: 20070316
Implantable Lead Location: 753860
Implantable Lead Model: 4469
Implantable Lead Serial Number: 523845
Lead Channel Impedance Value: 591 Ohm
Lead Channel Pacing Threshold Amplitude: 0.625 V
Lead Channel Pacing Threshold Pulse Width: 0.4 ms
Lead Channel Pacing Threshold Pulse Width: 0.4 ms
Lead Channel Setting Pacing Amplitude: 2 V
Lead Channel Setting Pacing Amplitude: 2.5 V
MDC IDC LEAD IMPLANT DT: 20070316
MDC IDC LEAD LOCATION: 753859
MDC IDC LEAD SERIAL: 478729
MDC IDC MSMT BATTERY VOLTAGE: 2.79 V
MDC IDC MSMT LEADCHNL RA IMPEDANCE VALUE: 437 Ohm
MDC IDC MSMT LEADCHNL RA PACING THRESHOLD AMPLITUDE: 0.375 V
MDC IDC PG IMPLANT DT: 20150930
MDC IDC SET LEADCHNL RV PACING PULSEWIDTH: 0.4 ms
MDC IDC SET LEADCHNL RV SENSING SENSITIVITY: 4 mV
MDC IDC STAT BRADY AP VP PERCENT: 5 %
MDC IDC STAT BRADY AS VP PERCENT: 95 %

## 2017-10-05 ENCOUNTER — Encounter: Payer: Self-pay | Admitting: Cardiology

## 2017-11-22 DIAGNOSIS — H6120 Impacted cerumen, unspecified ear: Secondary | ICD-10-CM | POA: Diagnosis not present

## 2017-11-22 DIAGNOSIS — L720 Epidermal cyst: Secondary | ICD-10-CM | POA: Diagnosis not present

## 2017-12-06 DIAGNOSIS — J209 Acute bronchitis, unspecified: Secondary | ICD-10-CM | POA: Diagnosis not present

## 2017-12-27 ENCOUNTER — Telehealth: Payer: Self-pay | Admitting: Cardiology

## 2017-12-27 ENCOUNTER — Encounter: Payer: Medicare Other | Admitting: *Deleted

## 2017-12-27 NOTE — Telephone Encounter (Signed)
Attempted to confirm remote transmission with pt. No answer and was unable to leave a message.   

## 2017-12-28 ENCOUNTER — Encounter: Payer: Self-pay | Admitting: Cardiology

## 2017-12-29 ENCOUNTER — Ambulatory Visit: Payer: Medicare HMO | Admitting: Internal Medicine

## 2017-12-29 ENCOUNTER — Encounter: Payer: Self-pay | Admitting: Internal Medicine

## 2017-12-29 VITALS — BP 124/64 | HR 64 | Ht 60.0 in | Wt 100.0 lb

## 2017-12-29 DIAGNOSIS — Z950 Presence of cardiac pacemaker: Secondary | ICD-10-CM | POA: Diagnosis not present

## 2017-12-29 DIAGNOSIS — I442 Atrioventricular block, complete: Secondary | ICD-10-CM | POA: Diagnosis not present

## 2017-12-29 LAB — CUP PACEART INCLINIC DEVICE CHECK
Battery Impedance: 302 Ohm
Battery Remaining Longevity: 95 mo
Battery Voltage: 2.79 V
Date Time Interrogation Session: 20190222153149
Implantable Lead Implant Date: 20070316
Implantable Lead Location: 753859
Implantable Lead Model: 4469
Implantable Lead Model: 4470
Implantable Lead Serial Number: 523845
Lead Channel Impedance Value: 432 Ohm
Lead Channel Pacing Threshold Amplitude: 0.375 V
Lead Channel Pacing Threshold Amplitude: 0.5 V
Lead Channel Pacing Threshold Amplitude: 0.625 V
Lead Channel Pacing Threshold Pulse Width: 0.4 ms
Lead Channel Pacing Threshold Pulse Width: 0.4 ms
Lead Channel Pacing Threshold Pulse Width: 0.4 ms
Lead Channel Setting Pacing Amplitude: 2.5 V
Lead Channel Setting Pacing Pulse Width: 0.4 ms
MDC IDC LEAD IMPLANT DT: 20070316
MDC IDC LEAD LOCATION: 753860
MDC IDC LEAD SERIAL: 478729
MDC IDC MSMT LEADCHNL RV IMPEDANCE VALUE: 576 Ohm
MDC IDC MSMT LEADCHNL RV PACING THRESHOLD AMPLITUDE: 0.75 V
MDC IDC MSMT LEADCHNL RV PACING THRESHOLD PULSEWIDTH: 0.4 ms
MDC IDC PG IMPLANT DT: 20150930
MDC IDC SET LEADCHNL RA PACING AMPLITUDE: 2 V
MDC IDC SET LEADCHNL RV SENSING SENSITIVITY: 4 mV
MDC IDC STAT BRADY AP VP PERCENT: 6 %
MDC IDC STAT BRADY AP VS PERCENT: 0 %
MDC IDC STAT BRADY AS VP PERCENT: 94 %
MDC IDC STAT BRADY AS VS PERCENT: 0 %

## 2017-12-29 MED ORDER — FUROSEMIDE 20 MG PO TABS: ORAL_TABLET | ORAL | 3 refills | Status: DC

## 2017-12-29 NOTE — Progress Notes (Signed)
HPI Erin [name] returns today for ongoing followup of CHB, s/p PPM insertion, HTN and dyslipidemia. She has had chronic pains in her hips and knees thought due to arthritis. Her main complaint today is that she has intermittent sob. These episodes can occur any time and are associated with mild chest pressure. Usually associated with exertion.  Allergies  Allergen Reactions  . Toprol Xl [Metoprolol Succinate] Other (See Comments)    UNKNOWN      Current Outpatient Medications  Medication Sig Dispense Refill  . Calcium Carb-Cholecalciferol (CALCIUM 1000 + D PO) Take 1,000 Units by mouth daily.     . calcium carbonate (TUMS EX) 750 MG chewable tablet Chew 2 tablets by mouth daily.     . chlordiazePOXIDE (LIBRIUM) 10 MG capsule Take 10 mg by mouth daily.     Marland Kitchen CINNAMON PO Take 1,000 mg by mouth 2 (two) times daily.    . Coenzyme Q10 (COQ-10) 400 MG CAPS Take 400 tablets by mouth daily.     Marland Kitchen CRANBERRY FRUIT PO Take 2,000 mg by mouth 2 (two) times daily.     . Fish Oil-Cholecalciferol (FISH OIL + D3 PO) Take 1 capsule by mouth 2 (two) times daily.     Nyoka Cowden Tea, Camillia sinensis, ([employer] GREEN TEA EXTRACT PO) Take 2 tablets by mouth 2 (two) times daily.     Marland Kitchen levothyroxine (SYNTHROID, LEVOTHROID) 50 MCG tablet Take 50 mcg by mouth daily before breakfast. Takes every other day.  Alternates with the other.    . levothyroxine (SYNTHROID, LEVOTHROID) 75 MCG tablet Take 75 mcg by mouth daily before breakfast.    . Misc Natural Products (OSTEO BI-FLEX ADV JOINT SHIELD PO) Take 1 tablet by mouth daily.    . Naproxen Sodium (ALEVE PO) Take by mouth.    . simvastatin (ZOCOR) 20 MG tablet TAKE 1 TABLET (20 MG TOTAL) BY MOUTH DAILY. 90 tablet 0  . furosemide (LASIX) 20 MG tablet Take one tablet daily.  May take one extra tablet daily as needed for shortness of breath. 90 tablet 3   No current facility-administered medications for this visit.      Past Medical History:  Diagnosis Date  .  Anxiety   . BBB (bundle branch block)   . Breast cancer ([place])   . Carotid artery plaque   . Diastolic CHF ([place])   . DVT (deep venous thrombosis) ([place])   . H/O: hysterectomy   . HTN (hypertension)   . Hyperlipidemia   . Hypothyroidism   . Memory loss   . Stroke ([place])     ROS:   All systems reviewed and negative except as noted in the HPI.   Past Surgical History:  Procedure Laterality Date  . ABDOMINAL HYSTERECTOMY    . BREAST SURGERY    . CARDIAC CATHETERIZATION  2007  . CAROTID ENDARTERECTOMY    . CARPAL TUNNEL RELEASE    . cataracts    . MASTECTOMY    . PACEMAKER GENERATOR CHANGE  08/08/2014   MDT [name] dual chamber pacemaker generator change by Dr Lovena Le  . PACEMAKER INSERTION    . PERMANENT PACEMAKER GENERATOR CHANGE N/A 08/07/2014   Procedure: PERMANENT PACEMAKER GENERATOR CHANGE;  Surgeon: Evans Lance, MD;  Location: [facility] CATH LAB;  Service: Cardiovascular;  Laterality: N/A;     Family History  Problem Relation Age of Onset  . Unexplained death Mother 32  . Cancer Mother   . Hyperlipidemia Mother   . Stroke  Mother   . Unexplained death Father 61     Social History   Socioeconomic History  . Marital status: Widowed    Spouse name: Not on file  . Number of children: Not on file  . Years of education: 12th  . Highest education level: Not on file  Social Needs  . Financial resource strain: Not on file  . Food insecurity - worry: Not on file  . Food insecurity - inability: Not on file  . Transportation needs - medical: Not on file  . Transportation needs - non-medical: Not on file  Occupational History  . Occupation: Retired  Tobacco Use  . Smoking status: Former Smoker    Packs/day: 0.75    Types: Cigarettes  . Smokeless tobacco: Never Used  Substance and Sexual Activity  . Alcohol use: No    Alcohol/week: 0.0 oz  . Drug use: No  . Sexual activity: Not on file  Other Topics Concern  . Not on file  Social History Narrative   Patient is a  widow.    Patient lives with daughter.   Patient has 2 adopted children.   Patient is retired.   Patient is right handed.   Patient has a high school education.           BP 124/64   Pulse 64   Ht 5' (1.524 m)   Wt 100 lb (45.4 kg)   BMI 19.53 kg/m   Physical Exam:  Well appearing elderly woman, NAD HEENT: Unremarkable Neck:  6 cm JVD, no thyromegally Lymphatics:  No adenopathy Back:  No CVA tenderness Lungs:  Clear with no wheezes HEART:  Regular rate rhythm, no murmurs, no rubs, no clicks Abd:  soft, positive bowel sounds, no organomegally, no rebound, no guarding Ext:  2 plus pulses, no edema, no cyanosis, no clubbing Skin:  No rashes no nodules Neuro:  CN II through XII intact, motor grossly intact  EKG - nsr with p synchronous ventricular pacing  DEVICE  Normal device function.  See PaceArt for details.   Assess/Plan: 1. Sob/chest pressure - while she could be having underlying ischemia, her symptoms mostly sound like diastolic heart failure. I have asked the patient to take an extra furosemide on days that she feels like she is sob.  2. HTN - her blood pressure is well controlled today. 3. PPM - her Medtronic DDD PM is working normally. Will recheck in several months.  Mikle Bosworth.D.

## 2017-12-29 NOTE — Patient Instructions (Signed)
Medication Instructions:  Your physician has recommended you make the following change in your medication:  1.  On days you feel short of breath increase your lasix from one tablet (20 mg) to two tablets (40 mg).  Labwork: None ordered.  Testing/Procedures: None ordered.  Follow-Up: Your physician wants you to follow-up in: one year with Dr. Lovena Le.   You will receive a reminder letter in the mail two months in advance. If you don't receive a letter, please call our office to schedule the follow-up appointment.  Remote monitoring is used to monitor your Pacemaker from home. This monitoring reduces the number of office visits required to check your device to one time per year. It allows [place] to keep an eye on the functioning of your device to ensure it is working properly. You are scheduled for a device check from home on 04/03/2018. You may send your transmission at any time that day. If you have a wireless device, the transmission will be sent automatically. After your physician reviews your transmission, you will receive a postcard with your next transmission date.  Any Other Special Instructions Will Be Listed Below (If Applicable).  If you need a refill on your cardiac medications before your next appointment, please call your pharmacy.

## 2018-01-08 DIAGNOSIS — R296 Repeated falls: Secondary | ICD-10-CM | POA: Diagnosis not present

## 2018-01-08 DIAGNOSIS — Z0000 Encounter for general adult medical examination without abnormal findings: Secondary | ICD-10-CM | POA: Diagnosis not present

## 2018-01-08 DIAGNOSIS — E039 Hypothyroidism, unspecified: Secondary | ICD-10-CM | POA: Diagnosis not present

## 2018-01-08 DIAGNOSIS — E7800 Pure hypercholesterolemia, unspecified: Secondary | ICD-10-CM | POA: Diagnosis not present

## 2018-01-08 DIAGNOSIS — F411 Generalized anxiety disorder: Secondary | ICD-10-CM | POA: Diagnosis not present

## 2018-01-08 DIAGNOSIS — M818 Other osteoporosis without current pathological fracture: Secondary | ICD-10-CM | POA: Diagnosis not present

## 2018-01-08 DIAGNOSIS — J0100 Acute maxillary sinusitis, unspecified: Secondary | ICD-10-CM | POA: Diagnosis not present

## 2018-01-12 DIAGNOSIS — C6991 Malignant neoplasm of unspecified site of right eye: Secondary | ICD-10-CM | POA: Diagnosis not present

## 2018-01-12 DIAGNOSIS — Z961 Presence of intraocular lens: Secondary | ICD-10-CM | POA: Diagnosis not present

## 2018-01-12 DIAGNOSIS — H11153 Pinguecula, bilateral: Secondary | ICD-10-CM | POA: Diagnosis not present

## 2018-01-12 DIAGNOSIS — H1132 Conjunctival hemorrhage, left eye: Secondary | ICD-10-CM | POA: Diagnosis not present

## 2018-01-12 DIAGNOSIS — H18413 Arcus senilis, bilateral: Secondary | ICD-10-CM | POA: Diagnosis not present

## 2018-01-16 ENCOUNTER — Telehealth: Payer: Self-pay | Admitting: Internal Medicine

## 2018-01-16 ENCOUNTER — Emergency Department (HOSPITAL_COMMUNITY)
Admission: EM | Admit: 2018-01-16 | Discharge: 2018-01-16 | Disposition: A | Discharge status: Home / Self Care | Payer: Medicare HMO | Attending: Emergency Medicine | Admitting: Emergency Medicine

## 2018-01-16 ENCOUNTER — Emergency Department (HOSPITAL_COMMUNITY): Payer: Medicare HMO

## 2018-01-16 ENCOUNTER — Encounter (HOSPITAL_COMMUNITY): Payer: Self-pay | Admitting: Emergency Medicine

## 2018-01-16 DIAGNOSIS — Z853 Personal history of malignant neoplasm of breast: Secondary | ICD-10-CM | POA: Insufficient documentation

## 2018-01-16 DIAGNOSIS — I5030 Unspecified diastolic (congestive) heart failure: Secondary | ICD-10-CM | POA: Insufficient documentation

## 2018-01-16 DIAGNOSIS — R251 Tremor, unspecified: Secondary | ICD-10-CM | POA: Diagnosis not present

## 2018-01-16 DIAGNOSIS — E039 Hypothyroidism, unspecified: Secondary | ICD-10-CM | POA: Insufficient documentation

## 2018-01-16 DIAGNOSIS — F0390 Unspecified dementia without behavioral disturbance: Secondary | ICD-10-CM | POA: Insufficient documentation

## 2018-01-16 DIAGNOSIS — Z8673 Personal history of transient ischemic attack (TIA), and cerebral infarction without residual deficits: Secondary | ICD-10-CM | POA: Insufficient documentation

## 2018-01-16 DIAGNOSIS — Z950 Presence of cardiac pacemaker: Secondary | ICD-10-CM | POA: Insufficient documentation

## 2018-01-16 DIAGNOSIS — Z79899 Other long term (current) drug therapy: Secondary | ICD-10-CM | POA: Diagnosis not present

## 2018-01-16 DIAGNOSIS — I110 Hypertensive heart disease with heart failure: Secondary | ICD-10-CM | POA: Diagnosis not present

## 2018-01-16 DIAGNOSIS — Z87891 Personal history of nicotine dependence: Secondary | ICD-10-CM | POA: Diagnosis not present

## 2018-01-16 DIAGNOSIS — I6789 Other cerebrovascular disease: Secondary | ICD-10-CM | POA: Diagnosis not present

## 2018-01-16 LAB — CBC
HCT: 40.3 % (ref 36.0–46.0)
Hemoglobin: 13.2 g/dL (ref 12.0–15.0)
MCH: 30.7 pg (ref 26.0–34.0)
MCHC: 32.8 g/dL (ref 30.0–36.0)
MCV: 93.7 fL (ref 78.0–100.0)
Platelets: 203 10*3/uL (ref 150–400)
RBC: 4.3 MIL/uL (ref 3.87–5.11)
RDW: 14.1 % (ref 11.5–15.5)
WBC: 3.9 10*3/uL — ABNORMAL LOW (ref 4.0–10.5)

## 2018-01-16 LAB — COMPREHENSIVE METABOLIC PANEL
ALT: 13 U/L — ABNORMAL LOW (ref 14–54)
ANION GAP: 6 (ref 5–15)
AST: 22 U/L (ref 15–41)
Albumin: 3.1 g/dL — ABNORMAL LOW (ref 3.5–5.0)
Alkaline Phosphatase: 58 U/L (ref 38–126)
BUN: 25 mg/dL — ABNORMAL HIGH (ref 6–20)
CO2: 29 mmol/L (ref 22–32)
Calcium: 8.9 mg/dL (ref 8.9–10.3)
Chloride: 107 mmol/L (ref 101–111)
Creatinine, Ser: 0.86 mg/dL (ref 0.44–1.00)
GFR calc Af Amer: >60 mL/min (ref >60)
GFR calc non Af Amer: 56 mL/min — ABNORMAL LOW (ref >60)
Glucose, Bld: 90 mg/dL (ref 65–99)
POTASSIUM: 4.1 mmol/L (ref 3.5–5.1)
SODIUM: 142 mmol/L (ref 135–145)
Total Bilirubin: 0.8 mg/dL (ref 0.3–1.2)
Total Protein: 5.6 g/dL — ABNORMAL LOW (ref 6.5–8.1)

## 2018-01-16 NOTE — ED Notes (Signed)
Pt verbalized understanding of discharge instructions and denies any further questions at this time.   

## 2018-01-16 NOTE — Telephone Encounter (Signed)
Received call from patient's daughter regarding her mother's

## 2018-01-16 NOTE — Discharge Instructions (Signed)
Return to the ED with any concerns including loss of consciousness, weakness of one arm or leg, changes in vision or speech, decreased level of alertness/lethargy, or any other alarming symptoms

## 2018-01-16 NOTE — ED Notes (Signed)
ED Provider at bedside. 

## 2018-01-16 NOTE — Telephone Encounter (Signed)
New message   Daughter calling with concerns of patient shaking for last 2 hours.

## 2018-01-16 NOTE — ED Triage Notes (Signed)
Hand and feet shaking starting in February with 3 episodes. This last one lasted several hours. Not post ictal. Negative stroke screen. Walked with walker per baseline. A&Ox4. Complaint free. CBG 111. Vitals stable. 165/73 96%RA pulse 74. Non of this activity witnessed by EMS

## 2018-01-16 NOTE — ED Notes (Signed)
Patient transported to CT 

## 2018-01-16 NOTE — ED Provider Notes (Signed)
[facility] EMERGENCY DEPARTMENT Provider Note   CSN: [id number] Arrival date & time: 01/16/18  1417     History   Chief Complaint Chief Complaint  Patient presents with  . Tremors    HPI Erin [name] is a 84 y.o. female.  HPI  A LEVEL 5 CAVEAT PERTAINS DUE TO DEMENTIA AND POOR HISTORIAN.  Patient presents due to full body tremor which lasted approximately 2 hours at home prior to arrival.  Symptoms resolved on their own prior to EMS arrival.  She did not lose consciousness or have any alteration of her consciousness.  She states she was shaking and was not able to stop it.  She did not have an electric jolt from her defibrillator firing.  She did not have any pain.  She did not fall.  Family states she has had a minor episode of similar symptoms in the past but these resolved as well also.  No headache, no changes in vision or speech.  Tremor was bilateral and affecting all 4 extremities but did not affect her level of consciousness.  There are no other associated systemic symptoms, there are no other alleviating or modifying factors.   Past Medical History:  Diagnosis Date  . Anxiety   . BBB (bundle branch block)   . Breast cancer ([place])   . Carotid artery plaque   . Diastolic CHF ([place])   . DVT (deep venous thrombosis) ([place])   . H/O: hysterectomy   . HTN (hypertension)   . Hyperlipidemia   . Hypothyroidism   . Memory loss   . Stroke [facility])     Patient Active Problem List   Diagnosis Date Noted  . 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 Pseudophakia of both eyes 05/27/2015  . Dyslipidemia 11/18/2014  . Mild cognitive disorder 09/02/2014  . Memory loss 09/02/2014  . Hx of completed stroke 08/29/2013  . Small vessel disease 08/29/2013  . Atrioventricular block, complete ([place]) 01/31/2012  . Hypertension 01/25/2011  . Pacemaker 01/25/2011  . Resolved stroke 01/25/2011    Past Surgical History:  Procedure Laterality Date  . ABDOMINAL  HYSTERECTOMY    . BREAST SURGERY    . CARDIAC CATHETERIZATION  2007  . CAROTID ENDARTERECTOMY    . CARPAL TUNNEL RELEASE    . cataracts    . MASTECTOMY    . PACEMAKER GENERATOR CHANGE  08/08/2014   MDT [name] dual chamber pacemaker generator change by Dr Lovena Le  . PACEMAKER INSERTION    . PERMANENT PACEMAKER GENERATOR CHANGE N/A 08/07/2014   Procedure: PERMANENT PACEMAKER GENERATOR CHANGE;  Surgeon: Evans Lance, MD;  Location: [facility] CATH LAB;  Service: Cardiovascular;  Laterality: N/A;    OB History    No data available       Home Medications    Prior to Admission medications   Medication Sig Start Date End Date Taking? Authorizing Provider  Calcium Carb-Cholecalciferol (CALCIUM 1000 + D PO) Take 1,000 Units by mouth daily.     [provider]  calcium carbonate (TUMS EX) 750 MG chewable tablet Chew 2 tablets by mouth daily.     [provider]  chlordiazePOXIDE (LIBRIUM) 10 MG capsule Take 10 mg by mouth daily.     [provider]  CINNAMON PO Take 1,000 mg by mouth 2 (two) times daily.    [provider]  Coenzyme Q10 (COQ-10) 400 MG CAPS Take 400 tablets by mouth daily.     [provider]  CRANBERRY FRUIT PO Take 2,000 mg by mouth 2 (two) times daily.     [provider]  Fish Oil-Cholecalciferol (FISH OIL + D3 PO) Take 1 capsule by mouth 2 (two) times daily.     [provider]  furosemide (LASIX) 20 MG tablet Take one tablet daily.  May take one extra tablet daily as needed for shortness of breath. 12/29/17   Evans Lance, MD  Eloise Levels, Camillia sinensis, ([employer] GREEN TEA EXTRACT PO) Take 2 tablets by mouth 2 (two) times daily.     [provider]  levothyroxine (SYNTHROID, LEVOTHROID) 50 MCG tablet Take 50 mcg by mouth daily before breakfast. Takes every other day.  Alternates with the other.    [provider]  levothyroxine (SYNTHROID, LEVOTHROID) 75 MCG tablet Take 75 mcg by mouth daily before  breakfast.    [provider]  Misc Natural Products (OSTEO BI-FLEX ADV JOINT SHIELD PO) Take 1 tablet by mouth daily.    [provider]  Naproxen Sodium (ALEVE PO) Take by mouth.    [provider]  simvastatin (ZOCOR) 20 MG tablet TAKE 1 TABLET (20 MG TOTAL) BY MOUTH DAILY. 11/24/15   Evans Lance, MD    Family History Family History  Problem Relation Age of Onset  . Unexplained death Mother 32  . Cancer Mother   . Hyperlipidemia Mother   . Stroke Mother   . Unexplained death Father 39    Social History Social History   Tobacco Use  . Smoking status: Former Smoker    Packs/day: 0.75    Types: Cigarettes  . Smokeless tobacco: Never Used  Substance Use Topics  . Alcohol use: No    Alcohol/week: 0.0 oz  . Drug use: No     Allergies   Toprol xl [metoprolol succinate]   Review of Systems Review of Systems  UNABLE TO OBTAIN ROS DUE TO LEVEL 5 CAVEAT   Physical Exam Updated Vital Signs BP (!) 155/61   Pulse 67   Temp 98.1 F (36.7 C) (Oral)   Resp 17   SpO2 96%  Vitals reviewed Physical Exam  Physical Examination: General appearance - alert, well appearing, and in no distress Mental status - alert, oriented to person, place, and time Eyes - no conjunctival injection no scleral icterus Mouth - mucous membranes moist, pharynx normal without lesions Neck - supple, no significant adenopathy Chest - clear to auscultation, no wheezes, rales or rhonchi, symmetric air entry Heart - normal rate, regular rhythm, normal S1, S2, no murmurs, rubs, clicks or gallops Abdomen - soft, nontender, nondistended, no masses or organomegaly Neurological - alert, oriented x 2, normal speech, strength 5/5 in extremities x 4, sensation intact, no tremor present on exam, not postictal Extremities - peripheral pulses normal, no pedal edema, no clubbing or cyanosis Skin - normal coloration and turgor, no rashes   ED Treatments / Results  Labs (all labs  ordered are listed, but only abnormal results are displayed) Labs Reviewed  CBC - Abnormal; Notable for the following components:      Result Value   WBC 3.9 (*)    All other components within normal limits  COMPREHENSIVE METABOLIC PANEL - Abnormal; Notable for the following components:   BUN 25 (*)    Total Protein 5.6 (*)    Albumin 3.1 (*)    ALT 13 (*)    GFR calc non Af Amer 56 (*)    All other components within normal limits  EKG  EKG Interpretation  Date/Time:  Tuesday January 16 2018 14:58:58 EDT Ventricular Rate:  67 PR Interval:    QRS Duration: 154 QT Interval:  476 QTC Calculation: 503 R Axis:   -79 Text Interpretation:  Atrial-sensed ventricular-paced complexes No further analysis attempted due to paced rhythm Baseline wander in lead(s) V3 No significant change since last tracing Confirmed by Alfonzo Beers [phone]) on 01/16/2018 3:02:01 PM Also confirmed by Alfonzo Beers [phone]), editor Lynder Parents [phone])  on 01/16/2018 3:12:49 PM       Radiology Ct Head Wo Contrast  Result Date: 01/16/2018 CLINICAL DATA:  Patient has been shaking for the past 2 hours. History of hypertension, infarct and breast cancer. EXAM: CT HEAD WITHOUT CONTRAST TECHNIQUE: Contiguous axial images were obtained from the base of the skull through the vertex without intravenous contrast. COMPARISON:  CT exams dating back through 06/02/2009. FINDINGS: Brain: Chronic superficial and central atrophy with moderate degree of chronic small vessel ischemia involving the periventricular and subcortical white matter. Chronic small focus of encephalomalacia in the right corona radiata from remote infarct. Basal ganglial calcifications are noted bilaterally left greater than right. No intra-axial mass nor extra-axial fluid collections. No acute intracranial hemorrhage. No new large vascular territory infarct. Midline fourth ventricle and basal cisterns without effacement. Vascular: No hyperdense vessel sign.  Mild atherosclerosis of the carotid siphons. Skull: No acute skull fracture or suspicious osseous lesions. Sinuses/Orbits: Bilateral lens replacement. No acute sinus or mastoid disease. Other: None IMPRESSION: Atrophy with chronic moderate small vessel ischemic disease of periventricular and subcortical white matter. Small focus of chronic encephalomalacia in the right corona radiata. No acute intracranial abnormality. Electronically Signed   By: Ashley Royalty M.D.   On: 01/16/2018 16:23    Procedures Procedures (including critical care time)  Medications Ordered in ED Medications - No data to display   Initial Impression / Assessment and Plan / ED Course  I have reviewed the triage vital signs and the nursing notes.  Pertinent labs & imaging results that were available during my care of the patient were reviewed by me and considered in my medical decision making (see chart for details).     Patient presents after episode at home today of shaking of her extremities.  She did not lose consciousness.  She was aware of the shaking but was not able to stop doing it.  Symptoms resolved prior to EMS arrival.  On evaluation in the ED her neuro exam is normal and she has no tremor.  She did not have any post ictal symptoms.  Doubt seizure activity.  Her electrolytes and head CT did not have any acute abnormalities.  Discussed results with patient and family and advised outpatient follow-up with neurology.  Discharged with strict return precautions.  Pt agreeable with plan.  Final Clinical Impressions(s) / ED Diagnoses   Final diagnoses:  Tremor    ED Discharge Orders    None       Layza Summa, Forbes Cellar, MD 01/16/18 [phone]

## 2018-01-16 NOTE — Telephone Encounter (Signed)
CONTINUED... Patient's mother has been shaking for the last 2 hours, so I advised them to go to the ED.Erin [name]  She verbalized understanding.Erin [name]

## 2018-01-17 ENCOUNTER — Telehealth: Payer: Self-pay | Admitting: Neurology

## 2018-01-17 NOTE — Telephone Encounter (Signed)
Pt daughter(on DPR) has called asking for a 3 day f/u for mother.  She was in the hospital and was told to f/u with Dr Leonie Man in 3 days for tremors.  Please advise if this is an acceptable appointment for you

## 2018-01-17 NOTE — Telephone Encounter (Signed)
I tried calling the home#jess listed on chart multiple times and the line rings busy.  I was unable to find out if pt is able to come in on 01-18-2018

## 2018-01-18 ENCOUNTER — Ambulatory Visit: Payer: Medicare HMO | Admitting: Adult Health

## 2018-01-18 ENCOUNTER — Telehealth: Payer: Self-pay | Admitting: Adult Health

## 2018-01-18 ENCOUNTER — Encounter: Payer: Self-pay | Admitting: Adult Health

## 2018-01-18 VITALS — BP 151/79 | HR 74 | Wt 97.2 lb

## 2018-01-18 DIAGNOSIS — Z8673 Personal history of transient ischemic attack (TIA), and cerebral infarction without residual deficits: Secondary | ICD-10-CM

## 2018-01-18 DIAGNOSIS — F09 Unspecified mental disorder due to known physiological condition: Secondary | ICD-10-CM | POA: Diagnosis not present

## 2018-01-18 DIAGNOSIS — R251 Tremor, unspecified: Secondary | ICD-10-CM | POA: Diagnosis not present

## 2018-01-18 DIAGNOSIS — F419 Anxiety disorder, unspecified: Secondary | ICD-10-CM

## 2018-01-18 MED ORDER — CHLORDIAZEPOXIDE HCL 10 MG PO CAPS: 10.0000 mg | ORAL_CAPSULE | Freq: Two times a day (BID) | ORAL | 2 refills | Status: DC

## 2018-01-18 MED ORDER — CHLORDIAZEPOXIDE HCL 5 MG PO CAPS: ORAL_CAPSULE | ORAL | 3 refills | Status: DC

## 2018-01-18 MED ORDER — CHLORDIAZEPOXIDE HCL 10 MG PO CAPS: ORAL_CAPSULE | ORAL | 3 refills | Status: DC

## 2018-01-18 NOTE — Telephone Encounter (Signed)
Janett Billow NP did new rx of librium 5mg . PT will take one tablet in the am, and 2 in the evening. RN fax prescription to [phone].

## 2018-01-18 NOTE — Telephone Encounter (Signed)
Rn Surveyor, minerals at [employer]. Rn stated the librium was change. Rx fax for one in the am, and 2 in the pm. Loma Sousa stated she did see the change,and receive the new rx.

## 2018-01-18 NOTE — Patient Instructions (Addendum)
Your Plan:  Increase librium from 10mg  daily to 5mg  in the morning and 10mg  in the evening Decrease stress and perform relaxation exercises Maintain stable environment  Follow up with me in 2 months or call earlier if needed   Thank you for coming to see [place] at [facility]. I hope we have been able to provide you high quality care today.  You may receive a patient satisfaction survey over the next few weeks. We would appreciate your feedback and comments so that we may continue to improve ourselves and the health of our patients.

## 2018-01-18 NOTE — Telephone Encounter (Signed)
Courtney with [employer] calling with questions concerning chlordiazePOXIDE (LIBRIUM) 10 MG capsule stating she is unclear how the pt can take half a capsule in the morning please contact at [phone]

## 2018-01-18 NOTE — Progress Notes (Signed)
PATIENT: Erin [name] DOB: Feb 04, 1920  REASON FOR VISIT: follow up - history of stroke and memory disturbance HISTORY FROM: patient  HISTORY OF PRESENT ILLNESS: Today 01/18/18: Recent hospital admission on 01/16/2018 for tremors.  Erin [name] is a 84 year old Caucasian female who presented to [facility] with complaints of full body tremor which lasted for 20-30 minutes (all previous notes have stated 2 hours but daughter and patient deny that this length).  All symptoms resolved prior to EMS arrival.  No neuro deficits and no postictal symptoms.  Denies losing consciousness or having any alteration in her consciousness.  She denies having electric jolt from her defibrillator firing.  Denies pain or fall.  Per family, she has had a minor episode of similar symptoms in the past but these resolved as well approximately 4-5 months ago.  Denies headache, vision changes, weakness or speech changes.  The tremors were present in all 4 limbs and she was unable to stop the tremoring.  EKG obtained which showed AV paced complexes and otherwise normal.  CT head reviewed and did show atrophy with chronic moderate small vessel ischemic disease with no acute intracranial abnormality.  EEG was not obtained due to unlikeliness that this was seizure activity.  CBC and CMP unremarkable.  Since patient left hospital, she denies any more tremor activity.  Denies any new medications or stopping medications.  She does admit to feeling stressed recently and her daughter states that they have been doing a lot of home renovations where her environment has been changing consistently.  She does state that her memory has been getting worse but continues to deny medication to help with this.  Patient did have labs checked by her PCP and they decreased her Synthroid dose and all others levels were unremarkable per daughter.  Patient is on Librium 10 mg daily for which she has been on for many years per patient.  Patient is  accompanied by her daughter.  HISTORY 07/05/17 (SP);  She returns for follow-up after last visit 6 months ago. She is accompanied by her daughter. Patient continues to have mild memory and cognitive difficulties which appear to be unchanged. She lives with her daughter. She can be left alone for a few hours. She is mostly independent in her activities and can do most things for herself except changing clothes and bedding. She in fact still writes her own checks. Patient continues to refuse medications like Aricept and Namenda due to fear of side effects. Patient is bothered mostly by hip pain which limits her ambulation. She uses a walker and she is careful and has not had any falls or injuries. She has refused hip surgery which has been suggested. She remains on aspirin 81 mg which is tolerating well without bruising or bleeding.  HISTORY 12/15/2016: Erin [name] is a 84 year old female with a history of stroke and memory disturbance. She returns today for follow-up. She states that overall she is doing well. She does notice changes with her memory. She lives at home with her daughter. She does require some assistance with dressing but can complete most ADLs with minimal or no assistance. Her daughter prepares all meals for her. She has a friend that manages her finances. She states that since her fall in April 3 years ago she has required more assistance in the home. She does have ongoing right hip pain for which she receives injections. She is currently not on any medication for her memory. She remains on aspirin for stroke  prevention. She returns today for an evaluation.  HISTORY 06/14/16: Erin [name] is a 84 year old female with a history of stroke and mild memory disturbance. She returns today for follow-up. She continues to live at home with her daughter. She is able to complete all ADLs independently. She remains on aspirin for stroke prevention. Primary care manages her blood pressure and cholesterol. She  denies any stroke like symptoms. She does not operate a motor vehicle. States that she sleeps well at night. Good appetite. Continue to have left hip pain. Will have injection next week. She returns today for an evaluation.  HISTORY 12/10/15: Erin [name] is a 84 year old female with a history of stroke and mild memory disturbance. She returns today for an evaluation. The patient reveals that her memory has remained stable for her age. She states that she lives at her home with her daughter. She reports that she is able to complete all ADLs independently. She also reports that she tends to do some of the household chores herself. She does not operate a motor vehicle. Her primary care manages her blood pressure and cholesterol. She continues on aspirin for stroke prevention. Patient reports that she had a fall and since then she's been having pain in the knees and hips. She states that she recently received injections in the hips for this discomfort. She denies any new neurological symptoms. She returns today for an evaluation.  HISTORY  Update 06/09/2015:She returns for follow-up after last visit 6 months ago. She continues to have mild short-term memory difficulties which appear to have slightly decline. She gets occasional confusion. She had her pacemaker replaced in October. She also had her thyroid medication dose changed. She has not been participating in activities for cognitively challenge  HISTORY: Erin [name] is a 86 year pleasant Caucasian lady with admission to [facility] for stroke on 06/01/09. She was admitted with sudden onset left hemiparesis but presented beyond time window for intervention. Initial Ct head was unremarkable. MRI could not be done due to having a pacemaker. CT angio showed 50-60% subclavian stenosis but no significant carotid or intracranial stenosis. Rt corona radiata white matter infarct was noted on repeat CT head.Cholestrol was 233 with LDL 160.HbA1c was 5.7 She  was sent to short term SNF and received PT/OT and went home with home health and finished out patient therapies and has done very well. She was changed from 81 to 325 mg Aspirin   REVIEW OF SYSTEMS: Out of a complete 14 system review of symptoms, the patient complains only of the following symptoms, and all other reviewed systems are negative.  Tremors and memory loss  ALLERGIES: Allergies  Allergen Reactions  . Toprol Xl [Metoprolol Succinate] Other (See Comments)    UNKNOWN     HOME MEDICATIONS: Outpatient Medications Prior to Visit  Medication Sig Dispense Refill  . alendronate (FOSAMAX) 70 MG tablet     . Calcium Carb-Cholecalciferol (CALCIUM 1000 + D PO) Take 1,000 Units by mouth daily.     . calcium carbonate (TUMS EX) 750 MG chewable tablet Chew 2 tablets by mouth daily.     Marland Kitchen CINNAMON PO Take 1,000 mg by mouth 2 (two) times daily.    . Coenzyme Q10 (COQ-10) 400 MG CAPS Take 400 tablets by mouth daily.     Marland Kitchen CRANBERRY FRUIT PO Take 2,000 mg by mouth 2 (two) times daily.     . Fish Oil-Cholecalciferol (FISH OIL + D3 PO) Take 1 capsule by mouth 2 (two) times daily.     Marland Kitchen  furosemide (LASIX) 20 MG tablet Take one tablet daily.  May take one extra tablet daily as needed for shortness of breath. 90 tablet 3  . Green Tea, Camillia sinensis, ([employer] GREEN TEA EXTRACT PO) Take 2 tablets by mouth 2 (two) times daily.     Marland Kitchen levothyroxine (SYNTHROID, LEVOTHROID) 50 MCG tablet Take 50 mcg by mouth daily before breakfast. Takes every other day.  Alternates with the other.    . levothyroxine (SYNTHROID, LEVOTHROID) 75 MCG tablet Take 75 mcg by mouth daily before breakfast.    . Misc Natural Products (OSTEO BI-FLEX ADV JOINT SHIELD PO) Take 1 tablet by mouth daily.    . Naproxen Sodium (ALEVE PO) Take by mouth.    . simvastatin (ZOCOR) 20 MG tablet TAKE 1 TABLET (20 MG TOTAL) BY MOUTH DAILY. 90 tablet 0  . chlordiazePOXIDE (LIBRIUM) 10 MG capsule Take 10 mg by mouth daily.      No  facility-administered medications prior to visit.     PAST MEDICAL HISTORY: Past Medical History:  Diagnosis Date  . Anxiety   . BBB (bundle branch block)   . Breast cancer ([place])   . Carotid artery plaque   . Diastolic CHF ([place])   . DVT (deep venous thrombosis) ([place])   . H/O: hysterectomy   . HTN (hypertension)   . Hyperlipidemia   . Hypothyroidism   . Memory loss   . Stroke [facility])     PAST SURGICAL HISTORY: Past Surgical History:  Procedure Laterality Date  . ABDOMINAL HYSTERECTOMY    . BREAST SURGERY    . CARDIAC CATHETERIZATION  2007  . CAROTID ENDARTERECTOMY    . CARPAL TUNNEL RELEASE    . cataracts    . MASTECTOMY    . PACEMAKER GENERATOR CHANGE  08/08/2014   MDT [name] dual chamber pacemaker generator change by Dr Lovena Le  . PACEMAKER INSERTION    . PERMANENT PACEMAKER GENERATOR CHANGE N/A 08/07/2014   Procedure: PERMANENT PACEMAKER GENERATOR CHANGE;  Surgeon: Evans Lance, MD;  Location: [facility] CATH LAB;  Service: Cardiovascular;  Laterality: N/A;    FAMILY HISTORY: Family History  Problem Relation Age of Onset  . Unexplained death Mother 72  . Cancer Mother   . Hyperlipidemia Mother   . Stroke Mother   . Unexplained death Father 51    SOCIAL HISTORY: Social History   Socioeconomic History  . Marital status: Widowed    Spouse name: Not on file  . Number of children: Not on file  . Years of education: 12th  . Highest education level: Not on file  Social Needs  . Financial resource strain: Not on file  . Food insecurity - worry: Not on file  . Food insecurity - inability: Not on file  . Transportation needs - medical: Not on file  . Transportation needs - non-medical: Not on file  Occupational History  . Occupation: Retired  Tobacco Use  . Smoking status: Former Smoker    Packs/day: 0.75    Types: Cigarettes  . Smokeless tobacco: Never Used  Substance and Sexual Activity  . Alcohol use: No    Alcohol/week: 0.0 oz  . Drug use: No  . Sexual  activity: Not on file  Other Topics Concern  . Not on file  Social History Narrative   Patient is a widow.    Patient lives with daughter.   Patient has 2 adopted children.   Patient is retired.   Patient is right handed.   Patient has a high  school education.            PHYSICAL EXAM Frail petite elderly Caucasian lady. Severe kyphoscoliosis. Favors right hip due to pain Vitals:   01/18/18 1329  BP: (!) 151/79  Pulse: 74  Weight: 97 lb 3.2 oz (44.1 kg)   Body mass index is 18.98 kg/m.   Generalized: Well developed, elderly Caucasian female, in no acute distress   Neurological examination  Mentation: Alert. Follows all commands speech and language fluent.Diminished attention, registration. Memory not tested. Extraocular movements were full, visual field were full on confrontational test. Facial sensation and strength were normal. Uvula tongue midline. Head turning and shoulder shrug  were normal and symmetric. Motor: The motor testing reveals 5 over 5 strength In all extremities with the exception of 4/5 strength in the right lower extremity due to hip discomfort. Good symmetric motor tone is noted throughout.  Sensory: Sensory testing is intact to soft touch on all 4 extremities. No evidence of extinction is noted.  Coordination: Cerebellar testing reveals good finger-nose-finger and heel-to-shin bilaterally.  Gait and station: Patient uses a walker when ambulating and favors right hip due to pain. Reflexes: Deep tendon reflexes are brisk but symmetric and normal bilaterally.   DIAGNOSTIC DATA (LABS, IMAGING, TESTING) - I reviewed patient records, labs, notes, testing and imaging myself where available.  Lab Results  Component Value Date   WBC 3.9 (L) 01/16/2018   HGB 13.2 01/16/2018   HCT 40.3 01/16/2018   MCV 93.7 01/16/2018   PLT 203 01/16/2018      Component Value Date/Time   NA 142 01/16/2018 1617   K 4.1 01/16/2018 1617   CL 107 01/16/2018 1617   CO2 29  01/16/2018 1617   GLUCOSE 90 01/16/2018 1617   BUN 25 (H) 01/16/2018 1617   CREATININE 0.86 01/16/2018 1617   CALCIUM 8.9 01/16/2018 1617   PROT 5.6 (L) 01/16/2018 1617   ALBUMIN 3.1 (L) 01/16/2018 1617   AST 22 01/16/2018 1617   ALT 13 (L) 01/16/2018 1617   ALKPHOS 58 01/16/2018 1617   BILITOT 0.8 01/16/2018 1617   GFRNONAA 56 (L) 01/16/2018 1617   GFRAA >60 01/16/2018 1617     Lab Results  Component Value Date   VITAMINB12 >1999 (H) 09/02/2014   Lab Results  Component Value Date   TSH 0.389 (L) 09/02/2014      ASSESSMENT AND PLAN 84 y.o. year old female  has a past medical history of Anxiety, BBB (bundle branch block), Breast cancer ([place]), Carotid artery plaque, Diastolic CHF ([place]), DVT (deep venous thrombosis) ([place]), H/O: hysterectomy, HTN (hypertension), Hyperlipidemia, Hypothyroidism, Memory loss, and Stroke ([place]). here with:   1.  New onset tremors 2. Stroke history 3.  Dementia Ms. krist is a 84 year old   As these tremors were unlikely seizure activity, there are most likely related to increase in stress or anxiety.  Patient does admit to feeling increased stress at this time with increase in memory loss and consistent changing in environment.  It is recommended that patient increase Librium 10 mg daily to help with anxiety.  Patient was in agreement to this.  Prescription sent in for Librium 5 mg in the a.m. and 10 mg in the p.m.  Patient should follow-up in 2 months or call earlier if needed. Greater than 50% time during this 25 minute consultation visit was spent on counseling and coordination of care about anxiety/stress can induce tremor activity.  Patient and daughter were educated on stress reduction techniques and importance of  keeping environment the same with a patient who has dementia.   Venancio Poisson, AGNP-BC  [facility] [address] [place], [place] [address]  Phone [phone] Fax [phone]

## 2018-02-14 DIAGNOSIS — W19XXXA Unspecified fall, initial encounter: Secondary | ICD-10-CM | POA: Diagnosis not present

## 2018-02-14 DIAGNOSIS — R54 Age-related physical debility: Secondary | ICD-10-CM | POA: Diagnosis not present

## 2018-02-14 DIAGNOSIS — S299XXA Unspecified injury of thorax, initial encounter: Secondary | ICD-10-CM | POA: Diagnosis not present

## 2018-02-14 DIAGNOSIS — R0781 Pleurodynia: Secondary | ICD-10-CM | POA: Diagnosis not present

## 2018-02-14 DIAGNOSIS — M25512 Pain in left shoulder: Secondary | ICD-10-CM | POA: Diagnosis not present

## 2018-03-09 DIAGNOSIS — G309 Alzheimer's disease, unspecified: Secondary | ICD-10-CM | POA: Diagnosis not present

## 2018-03-09 DIAGNOSIS — R54 Age-related physical debility: Secondary | ICD-10-CM | POA: Diagnosis not present

## 2018-03-15 DIAGNOSIS — H52223 Regular astigmatism, bilateral: Secondary | ICD-10-CM | POA: Diagnosis not present

## 2018-03-15 DIAGNOSIS — H3540 Unspecified peripheral retinal degeneration: Secondary | ICD-10-CM | POA: Diagnosis not present

## 2018-03-15 DIAGNOSIS — H524 Presbyopia: Secondary | ICD-10-CM | POA: Diagnosis not present

## 2018-03-15 DIAGNOSIS — C6991 Malignant neoplasm of unspecified site of right eye: Secondary | ICD-10-CM | POA: Diagnosis not present

## 2018-03-15 DIAGNOSIS — Z961 Presence of intraocular lens: Secondary | ICD-10-CM | POA: Diagnosis not present

## 2018-03-15 DIAGNOSIS — H5203 Hypermetropia, bilateral: Secondary | ICD-10-CM | POA: Diagnosis not present

## 2018-03-20 ENCOUNTER — Ambulatory Visit: Payer: Medicare HMO | Admitting: Adult Health

## 2018-03-21 ENCOUNTER — Encounter: Payer: Self-pay | Admitting: Adult Health

## 2018-03-28 ENCOUNTER — Encounter: Payer: Self-pay | Admitting: Adult Health

## 2018-03-28 ENCOUNTER — Ambulatory Visit: Payer: Medicare HMO | Admitting: Adult Health

## 2018-03-28 VITALS — BP 130/63 | HR 62 | Ht 60.0 in | Wt 100.4 lb

## 2018-03-28 DIAGNOSIS — Z8673 Personal history of transient ischemic attack (TIA), and cerebral infarction without residual deficits: Secondary | ICD-10-CM | POA: Diagnosis not present

## 2018-03-28 DIAGNOSIS — F0280 Dementia in other diseases classified elsewhere without behavioral disturbance: Secondary | ICD-10-CM | POA: Diagnosis not present

## 2018-03-28 DIAGNOSIS — G301 Alzheimer's disease with late onset: Secondary | ICD-10-CM

## 2018-03-28 DIAGNOSIS — F419 Anxiety disorder, unspecified: Secondary | ICD-10-CM | POA: Diagnosis not present

## 2018-03-28 DIAGNOSIS — R251 Tremor, unspecified: Secondary | ICD-10-CM

## 2018-03-28 MED ORDER — ACETAMINOPHEN 325 MG PO TABS: 325.0000 mg | ORAL_TABLET | Freq: Four times a day (QID) | ORAL | Status: DC | PRN

## 2018-03-28 MED ORDER — ASPIRIN 81 MG PO TABS: 81.0000 mg | ORAL_TABLET | Freq: Every day | ORAL | Status: DC

## 2018-03-28 NOTE — Patient Instructions (Signed)
Continue aspirin 81 mg daily  and simvastatin  for secondary stroke prevention  Continue librium for anxiety  Continue to follow up with PCP regarding cholesterol and blood pressure management   Continue to do mind exercises such as word search, cross word puzzles and card games  Maintain strict control of hypertension with blood pressure goal below 130/90, diabetes with hemoglobin A1c goal below 6.5% and cholesterol with LDL cholesterol (bad cholesterol) goal below 70 mg/dL. I also advised the patient to eat a healthy diet with plenty of whole grains, cereals, fruits and vegetables, exercise regularly and maintain ideal body weight.  Followup in the future with me in 6 months or call earlier if needed       Thank you for coming to see [place] at [facility]. I hope we have been able to provide you high quality care today.  You may receive a patient satisfaction survey over the next few weeks. We would appreciate your feedback and comments so that we may continue to improve ourselves and the health of our patients.

## 2018-03-28 NOTE — Progress Notes (Addendum)
PATIENT: Erin [name] DOB: Oct 27, 1920  REASON FOR VISIT: follow up -anxiety related tremors and memory HISTORY FROM: patient and daughter  HISTORY OF PRESENT ILLNESS: Today 03/28/18: Patient returns today for 46-month follow-up appointment and is accompanied by her daughter.  At previous appointment, Librium dose was increased and attempt to possibly treat anxiety related tremors.  At today's appointment, patient states that she no longer has the tremors since dosage increase.  Patient continues to take aspirin without side effects of bleeding or bruising and Zocor without side effects of myalgias for secondary stroke prevention.  Blood pressure today satisfactory at 130/63.  Daughter continues to live with patient to assist with some ADLs and most IADLs.  Patient using rolling walker for ambulation but during ambulation, patient drags her left foot which has been occurring for the past 2 to 3 weeks per patient.  Daughter is unsure of exactly how long this is been going on for.  She did have a fall a few weeks ago resulting in fractured ribs and per daughter, she does not believe imaging was taken of her hip or her knee.  Highly recommended contacting PCP in regards to this.  Memory tested at today's appointment with a decrease in MMSE at 18 (previous 21).  Patient and daughters continue to be reluctant on starting a medication such as Aricept.  Denies any further or new concerns at this time.  Denies new or worsening stroke/TIA symptoms.  History 01/18/18: Recent hospital admission on 01/16/2018 for tremors.  Erin [name] is a 84 year old Caucasian female who presented to [facility] with complaints of full body tremor which lasted for 20-30 minutes (all previous notes have stated 2 hours but daughter and patient deny that this length).  All symptoms resolved prior to EMS arrival.  No neuro deficits and no postictal symptoms.  Denies losing consciousness or having any alteration in her consciousness.   She denies having electric jolt from her defibrillator firing.  Denies pain or fall.  Per family, she has had a minor episode of similar symptoms in the past but these resolved as well approximately 4-5 months ago.  Denies headache, vision changes, weakness or speech changes.  The tremors were present in all 4 limbs and she was unable to stop the tremoring.  EKG obtained which showed AV paced complexes and otherwise normal.  CT head reviewed and did show atrophy with chronic moderate small vessel ischemic disease with no acute intracranial abnormality.  EEG was not obtained due to unlikeliness that this was seizure activity.  CBC and CMP unremarkable.  Since patient left hospital, she denies any more tremor activity.  Denies any new medications or stopping medications.  She does admit to feeling stressed recently and her daughter states that they have been doing a lot of home renovations where her environment has been changing consistently.  She does state that her memory has been getting worse but continues to deny medication to help with this.  Patient did have labs checked by her PCP and they decreased her Synthroid dose and all others levels were unremarkable per daughter.  Patient is on Librium 10 mg daily for which she has been on for many years per patient.  Patient is accompanied by her daughter.  HISTORY 07/05/17 (SP);  She returns for follow-up after last visit 6 months ago. She is accompanied by her daughter. Patient continues to have mild memory and cognitive difficulties which appear to be unchanged. She lives with her daughter. She can  be left alone for a few hours. She is mostly independent in her activities and can do most things for herself except changing clothes and bedding. She in fact still writes her own checks. Patient continues to refuse medications like Aricept and Namenda due to fear of side effects. Patient is bothered mostly by hip pain which limits her ambulation. She uses a walker and  she is careful and has not had any falls or injuries. She has refused hip surgery which has been suggested. She remains on aspirin 81 mg which is tolerating well without bruising or bleeding.  HISTORY 12/15/2016: Erin [name] is a 84 year old female with a history of stroke and memory disturbance. She returns today for follow-up. She states that overall she is doing well. She does notice changes with her memory. She lives at home with her daughter. She does require some assistance with dressing but can complete most ADLs with minimal or no assistance. Her daughter prepares all meals for her. She has a friend that manages her finances. She states that since her fall in April 3 years ago she has required more assistance in the home. She does have ongoing right hip pain for which she receives injections. She is currently not on any medication for her memory. She remains on aspirin for stroke prevention. She returns today for an evaluation.  HISTORY 06/14/16: Erin [name] is a 84 year old female with a history of stroke and mild memory disturbance. She returns today for follow-up. She continues to live at home with her daughter. She is able to complete all ADLs independently. She remains on aspirin for stroke prevention. Primary care manages her blood pressure and cholesterol. She denies any stroke like symptoms. She does not operate a motor vehicle. States that she sleeps well at night. Good appetite. Continue to have left hip pain. Will have injection next week. She returns today for an evaluation.  HISTORY 12/10/15: Erin [name] is a 84 year old female with a history of stroke and mild memory disturbance. She returns today for an evaluation. The patient reveals that her memory has remained stable for her age. She states that she lives at her home with her daughter. She reports that she is able to complete all ADLs independently. She also reports that she tends to do some of the household chores herself. She does not  operate a motor vehicle. Her primary care manages her blood pressure and cholesterol. She continues on aspirin for stroke prevention. Patient reports that she had a fall and since then she's been having pain in the knees and hips. She states that she recently received injections in the hips for this discomfort. She denies any new neurological symptoms. She returns today for an evaluation.  HISTORY  Update 06/09/2015:She returns for follow-up after last visit 6 months ago. She continues to have mild short-term memory difficulties which appear to have slightly decline. She gets occasional confusion. She had her pacemaker replaced in October. She also had her thyroid medication dose changed. She has not been participating in activities for cognitively challenge  HISTORY: Ms. Camberos is a 10 year pleasant Caucasian lady with admission to [facility] for stroke on 06/01/09. She was admitted with sudden onset left hemiparesis but presented beyond time window for intervention. Initial Ct head was unremarkable. MRI could not be done due to having a pacemaker. CT angio showed 50-60% subclavian stenosis but no significant carotid or intracranial stenosis. Rt corona radiata white matter infarct was noted on repeat CT head.Cholestrol was 233 with  LDL 160.HbA1c was 5.7 She was sent to short term SNF and received PT/OT and went home with home health and finished out patient therapies and has done very well. She was changed from 81 to 325 mg Aspirin   REVIEW OF SYSTEMS: Out of a complete 14 system review of symptoms, the patient complains only of the following symptoms, and all other reviewed systems are negative.  Tremors and memory loss  ALLERGIES: Allergies  Allergen Reactions  . Toprol Xl [Metoprolol Succinate] Other (See Comments)    UNKNOWN     HOME MEDICATIONS: Outpatient Medications Prior to Visit  Medication Sig Dispense Refill  . alendronate (FOSAMAX) 70 MG tablet     . Calcium  Carb-Cholecalciferol (CALCIUM 1000 + D PO) Take 1,000 Units by mouth daily.     . calcium carbonate (TUMS EX) 750 MG chewable tablet Chew 2 tablets by mouth daily.     . chlordiazePOXIDE (LIBRIUM) 5 MG capsule Take 1 capsule in the morning and take 2 capsules in the evening 45 capsule 3  . CINNAMON PO Take 1,000 mg by mouth 2 (two) times daily.    . Coenzyme Q10 (COQ-10) 400 MG CAPS Take 400 tablets by mouth daily.     Marland Kitchen CRANBERRY FRUIT PO Take 2,000 mg by mouth 2 (two) times daily.     . Fish Oil-Cholecalciferol (FISH OIL + D3 PO) Take 1 capsule by mouth 2 (two) times daily.     . furosemide (LASIX) 20 MG tablet Take one tablet daily.  May take one extra tablet daily as needed for shortness of breath. 90 tablet 3  . Green Tea, Camillia sinensis, ([employer] GREEN TEA EXTRACT PO) Take 2 tablets by mouth 2 (two) times daily.     Marland Kitchen levothyroxine (SYNTHROID, LEVOTHROID) 50 MCG tablet Take 50 mcg by mouth daily before breakfast. Takes every other day.  Alternates with the other.    . levothyroxine (SYNTHROID, LEVOTHROID) 75 MCG tablet Take 75 mcg by mouth daily before breakfast.    . Misc Natural Products (OSTEO BI-FLEX ADV JOINT SHIELD PO) Take 1 tablet by mouth daily.    . simvastatin (ZOCOR) 20 MG tablet TAKE 1 TABLET (20 MG TOTAL) BY MOUTH DAILY. 90 tablet 0  . Naproxen Sodium (ALEVE PO) Take by mouth.     No facility-administered medications prior to visit.     PAST MEDICAL HISTORY: Past Medical History:  Diagnosis Date  . Anxiety   . BBB (bundle branch block)   . Breast cancer ([place])   . Carotid artery plaque   . Diastolic CHF ([place])   . DVT (deep venous thrombosis) ([place])   . H/O: hysterectomy   . HTN (hypertension)   . Hyperlipidemia   . Hypothyroidism   . Memory loss   . Stroke [facility])     PAST SURGICAL HISTORY: Past Surgical History:  Procedure Laterality Date  . ABDOMINAL HYSTERECTOMY    . BREAST SURGERY    . CARDIAC CATHETERIZATION  2007  . CAROTID ENDARTERECTOMY    . CARPAL  TUNNEL RELEASE    . cataracts    . MASTECTOMY    . PACEMAKER GENERATOR CHANGE  08/08/2014   MDT [name] dual chamber pacemaker generator change by Dr Lovena Le  . PACEMAKER INSERTION    . PERMANENT PACEMAKER GENERATOR CHANGE N/A 08/07/2014   Procedure: PERMANENT PACEMAKER GENERATOR CHANGE;  Surgeon: Evans Lance, MD;  Location: [facility] CATH LAB;  Service: Cardiovascular;  Laterality: N/A;    FAMILY HISTORY: Family History  Problem  Relation Age of Onset  . Unexplained death Mother 84  . Cancer Mother   . Hyperlipidemia Mother   . Stroke Mother   . Unexplained death Father 31    SOCIAL HISTORY: Social History   Socioeconomic History  . Marital status: Widowed    Spouse name: Not on file  . Number of children: Not on file  . Years of education: 12th  . Highest education level: Not on file  Occupational History  . Occupation: Retired  Scientific laboratory technician  . Financial resource strain: Not on file  . Food insecurity:    Worry: Not on file    Inability: Not on file  . Transportation needs:    Medical: Not on file    Non-medical: Not on file  Tobacco Use  . Smoking status: Former Smoker    Packs/day: 0.75    Types: Cigarettes  . Smokeless tobacco: Never Used  Substance and Sexual Activity  . Alcohol use: No    Alcohol/week: 0.0 oz  . Drug use: No  . Sexual activity: Not on file  Lifestyle  . Physical activity:    Days per week: Not on file    Minutes per session: Not on file  . Stress: Not on file  Relationships  . Social connections:    Talks on phone: Not on file    Gets together: Not on file    Attends religious service: Not on file    Active member of club or organization: Not on file    Attends meetings of clubs or organizations: Not on file    Relationship status: Not on file  . Intimate partner violence:    Fear of current or ex partner: Not on file    Emotionally abused: Not on file    Physically abused: Not on file    Forced sexual activity: Not on file  Other  Topics Concern  . Not on file  Social History Narrative   Patient is a widow.    Patient lives with daughter.   Patient has 2 adopted children.   Patient is retired.   Patient is right handed.   Patient has a high school education.            PHYSICAL EXAM Frail petite elderly Caucasian lady. Severe kyphoscoliosis. Favors right hip due to pain Vitals:   03/28/18 1505  BP: 130/63  Pulse: 62  Weight: 100 lb 6.4 oz (45.5 kg)  Height: 5' (1.524 m)   Body mass index is 19.61 kg/m.   Generalized: Well developed, elderly Caucasian female, in no acute distress  MMSE - Mini Mental State Exam 03/28/2018 12/15/2016 06/14/2016  Orientation to time 3 4 5   Orientation to Place 4 4 4   Registration 3 3 3   Attention/ Calculation 1 2 5   Recall 0 0 0  Language- name 2 objects 2 2 2   Language- repeat 0 1 1  Language- follow 3 step command 3 3 3   Language- read & follow direction 1 1 1   Write a sentence 1 1 1   Copy design 0 0 0  Total score 18 21 25     Neurological examination  Mentation: Alert. Follows all commands speech and language fluent. Diminished attention, registration.  MMSE 18.  Cranial nerves: Extraocular movements were full, visual field were full on confrontational test. Facial sensation and strength were normal. Uvula tongue midline. Head turning and shoulder shrug  were normal and symmetric. Motor: The motor testing reveals 5 over 5 strength In all extremities  with the exception of 4/5 strength in the left lower extremity due to hip discomfort. Good symmetric motor tone is noted throughout.  Sensory: Sensory testing is intact to soft touch on all 4 extremities. No evidence of extinction is noted.  Coordination: Cerebellar testing reveals good finger-nose-finger and heel-to-shin bilaterally.  Gait and station: Patient uses a walker when ambulating and favors right hip due to pain. Reflexes: Deep tendon reflexes are brisk but symmetric and normal bilaterally.   DIAGNOSTIC DATA  (LABS, IMAGING, TESTING) - I reviewed patient records, labs, notes, testing and imaging myself where available.  Lab Results  Component Value Date   WBC 3.9 (L) 01/16/2018   HGB 13.2 01/16/2018   HCT 40.3 01/16/2018   MCV 93.7 01/16/2018   PLT 203 01/16/2018      Component Value Date/Time   NA 142 01/16/2018 1617   K 4.1 01/16/2018 1617   CL 107 01/16/2018 1617   CO2 29 01/16/2018 1617   GLUCOSE 90 01/16/2018 1617   BUN 25 (H) 01/16/2018 1617   CREATININE 0.86 01/16/2018 1617   CALCIUM 8.9 01/16/2018 1617   PROT 5.6 (L) 01/16/2018 1617   ALBUMIN 3.1 (L) 01/16/2018 1617   AST 22 01/16/2018 1617   ALT 13 (L) 01/16/2018 1617   ALKPHOS 58 01/16/2018 1617   BILITOT 0.8 01/16/2018 1617   GFRNONAA 56 (L) 01/16/2018 1617   GFRAA >60 01/16/2018 1617     Lab Results  Component Value Date   VITAMINB12 >1999 (H) 09/02/2014   Lab Results  Component Value Date   TSH 0.389 (L) 09/02/2014      ASSESSMENT AND PLAN 84 y.o. year old female  has a past medical history of Anxiety, BBB (bundle branch block), Breast cancer ([place]), Carotid artery plaque, Diastolic CHF ([place]), DVT (deep venous thrombosis) ([place]), H/O: hysterectomy, HTN (hypertension), Hyperlipidemia, Hypothyroidism, Memory loss, and Stroke ([place]). here with:  1.  Worsening of memory 2.  History of stroke   Patient overall is doing well.  She does have decreased memory but patient and daughter continue to be reluctant on starting any medications at this time.  Advised patient to do memory exercises such as sudoku, word search, crossword puzzles or card games.  Patient recently had a fall a few weeks ago where she sustained fractured ribs.  Since this time, patient has been dragging left foot during ambulation complaining of hip and knee pain.  Question daughter whether imaging was done but she does not believe it was.  Advised daughter to contact PCP in order to have imaging completed.  Daughter stated understanding.  We will  follow-up with patient in 6 months time for memory reevaluation.  Greater than 50% time during this 25 minute consultation visit was spent on counseling and coordination of care about anxiety/stress can induce tremor activity.  Patient and daughter were educated on stress reduction techniques and importance of keeping environment the same with a patient who has dementia.   Venancio Poisson, AGNP-BC  [facility] [address] [place], [place] [address]  Phone [phone] Fax [phone]  I reviewed the above note and documentation by the Nurse Practitioner and agree with the history, physical exam, assessment and plan as outlined above. I was immediately available for face-to-face consultation. Star Age, MD, PhD [facility])

## 2018-04-03 ENCOUNTER — Ambulatory Visit (INDEPENDENT_AMBULATORY_CARE_PROVIDER_SITE_OTHER): Payer: Medicare HMO | Admitting: *Deleted

## 2018-04-03 DIAGNOSIS — I442 Atrioventricular block, complete: Secondary | ICD-10-CM

## 2018-04-05 LAB — CUP PACEART REMOTE DEVICE CHECK
Battery Impedance: 404 Ohm
Battery Remaining Longevity: 86 mo
Battery Voltage: 2.79 V
Brady Statistic AP VS Percent: 0 %
Brady Statistic AS VP Percent: 99 %
Implantable Lead Implant Date: 20070316
Implantable Lead Location: 753859
Implantable Lead Model: 4469
Implantable Lead Model: 4470
Implantable Lead Serial Number: 523845
Implantable Pulse Generator Implant Date: 20150930
Lead Channel Pacing Threshold Amplitude: 0.375 V
Lead Channel Pacing Threshold Pulse Width: 0.4 ms
Lead Channel Setting Pacing Amplitude: 2 V
Lead Channel Setting Pacing Amplitude: 2.5 V
Lead Channel Setting Pacing Pulse Width: 0.4 ms
MDC IDC LEAD IMPLANT DT: 20070316
MDC IDC LEAD LOCATION: 753860
MDC IDC LEAD SERIAL: 478729
MDC IDC MSMT LEADCHNL RA IMPEDANCE VALUE: 431 Ohm
MDC IDC MSMT LEADCHNL RV IMPEDANCE VALUE: 556 Ohm
MDC IDC MSMT LEADCHNL RV PACING THRESHOLD AMPLITUDE: 0.75 V
MDC IDC MSMT LEADCHNL RV PACING THRESHOLD PULSEWIDTH: 0.4 ms
MDC IDC SESS DTM: 20190528160407
MDC IDC SET LEADCHNL RV SENSING SENSITIVITY: 4 mV
MDC IDC STAT BRADY AP VP PERCENT: 1 %
MDC IDC STAT BRADY AS VS PERCENT: 0 %

## 2018-04-06 ENCOUNTER — Encounter: Payer: Self-pay | Admitting: Cardiology

## 2018-07-03 ENCOUNTER — Ambulatory Visit (INDEPENDENT_AMBULATORY_CARE_PROVIDER_SITE_OTHER): Payer: Medicare HMO | Admitting: *Deleted

## 2018-07-03 DIAGNOSIS — I442 Atrioventricular block, complete: Secondary | ICD-10-CM

## 2018-07-03 NOTE — Progress Notes (Signed)
Remote pacemaker transmission.   

## 2018-07-05 ENCOUNTER — Encounter: Payer: Self-pay | Admitting: Cardiology

## 2018-07-18 DIAGNOSIS — R1110 Vomiting, unspecified: Secondary | ICD-10-CM | POA: Diagnosis not present

## 2018-07-18 LAB — CUP PACEART REMOTE DEVICE CHECK
Battery Remaining Longevity: 85 mo
Battery Voltage: 2.79 V
Brady Statistic AP VP Percent: 5 %
Brady Statistic AS VS Percent: 0 %
Date Time Interrogation Session: 20190827171343
Implantable Lead Implant Date: 20070316
Implantable Lead Implant Date: 20070316
Implantable Lead Location: 753860
Implantable Lead Model: 4469
Implantable Lead Serial Number: 478729
Implantable Pulse Generator Implant Date: 20150930
Lead Channel Impedance Value: 596 Ohm
Lead Channel Pacing Threshold Amplitude: 0.625 V
Lead Channel Pacing Threshold Pulse Width: 0.4 ms
Lead Channel Setting Pacing Amplitude: 2 V
Lead Channel Setting Sensing Sensitivity: 4 mV
MDC IDC LEAD LOCATION: 753859
MDC IDC LEAD SERIAL: 523845
MDC IDC MSMT BATTERY IMPEDANCE: 429 Ohm
MDC IDC MSMT LEADCHNL RA IMPEDANCE VALUE: 482 Ohm
MDC IDC MSMT LEADCHNL RA PACING THRESHOLD AMPLITUDE: 0.375 V
MDC IDC MSMT LEADCHNL RA PACING THRESHOLD PULSEWIDTH: 0.4 ms
MDC IDC SET LEADCHNL RV PACING AMPLITUDE: 2.5 V
MDC IDC SET LEADCHNL RV PACING PULSEWIDTH: 0.4 ms
MDC IDC STAT BRADY AP VS PERCENT: 0 %
MDC IDC STAT BRADY AS VP PERCENT: 95 %

## 2018-07-31 DIAGNOSIS — M1990 Unspecified osteoarthritis, unspecified site: Secondary | ICD-10-CM | POA: Diagnosis not present

## 2018-07-31 DIAGNOSIS — J3489 Other specified disorders of nose and nasal sinuses: Secondary | ICD-10-CM | POA: Diagnosis not present

## 2018-08-25 DIAGNOSIS — K130 Diseases of lips: Secondary | ICD-10-CM | POA: Diagnosis not present

## 2018-09-05 DIAGNOSIS — R238 Other skin changes: Secondary | ICD-10-CM | POA: Diagnosis not present

## 2018-09-05 DIAGNOSIS — K136 Irritative hyperplasia of oral mucosa: Secondary | ICD-10-CM | POA: Diagnosis not present

## 2018-09-24 ENCOUNTER — Telehealth: Payer: Self-pay

## 2018-09-24 ENCOUNTER — Ambulatory Visit: Payer: Medicare HMO | Admitting: Adult Health

## 2018-09-24 ENCOUNTER — Encounter: Payer: Self-pay | Admitting: Adult Health

## 2018-09-24 NOTE — Telephone Encounter (Signed)
Patient no show for appt today. 

## 2018-10-01 ENCOUNTER — Telehealth: Payer: Self-pay | Admitting: *Deleted

## 2018-10-01 NOTE — Telephone Encounter (Signed)
Gaynell in billing relayed that pt called.  Asking if needs appt.  We see for ALZ/tremors/  hx stroke.  She is not on medication for memory at this time.  Takes librium tremors/ anxiety.

## 2018-10-01 NOTE — Telephone Encounter (Signed)
It was recommended for patient to follow-up in 6 months time with prior schedule appointment on 09/24/2018 no-show.  It would be recommended for patient to schedule follow-up appointment soon for assessment of tremors and memory.  Thank you.

## 2018-10-02 ENCOUNTER — Ambulatory Visit (INDEPENDENT_AMBULATORY_CARE_PROVIDER_SITE_OTHER): Payer: Medicare HMO

## 2018-10-02 ENCOUNTER — Telehealth: Payer: Self-pay | Admitting: Cardiology

## 2018-10-02 DIAGNOSIS — I442 Atrioventricular block, complete: Secondary | ICD-10-CM

## 2018-10-02 NOTE — Telephone Encounter (Signed)
Busy signal

## 2018-10-02 NOTE — Telephone Encounter (Signed)
Called and spoke to pt, her daughter, Olin Hauser is not here at the moment.  I relayed that needs to schedule f/u appt with JV/NP for tremors and memory f/u.  She would let her know.  I told her would LM so phone staff would know what is needed.

## 2018-10-02 NOTE — Telephone Encounter (Signed)
Spoke with pt and reminded pt of remote transmission that is due today. Pt verbalized understanding.   

## 2018-10-03 ENCOUNTER — Encounter: Payer: Self-pay | Admitting: Cardiology

## 2018-10-03 NOTE — Progress Notes (Signed)
Remote pacemaker transmission.   

## 2018-10-10 ENCOUNTER — Encounter: Payer: Self-pay | Admitting: *Deleted

## 2018-10-10 NOTE — Telephone Encounter (Signed)
Mailed letter to pt

## 2018-10-10 NOTE — Telephone Encounter (Signed)
Called pts daughter on # provided in chart 3 times each time got a busy signal. FYI

## 2018-10-24 DIAGNOSIS — E039 Hypothyroidism, unspecified: Secondary | ICD-10-CM | POA: Diagnosis not present

## 2018-10-24 DIAGNOSIS — G309 Alzheimer's disease, unspecified: Secondary | ICD-10-CM | POA: Diagnosis not present

## 2018-10-24 DIAGNOSIS — K5900 Constipation, unspecified: Secondary | ICD-10-CM | POA: Diagnosis not present

## 2018-11-08 ENCOUNTER — Other Ambulatory Visit: Payer: Self-pay | Admitting: Adult Health

## 2018-11-08 DIAGNOSIS — F419 Anxiety disorder, unspecified: Secondary | ICD-10-CM

## 2018-11-12 DIAGNOSIS — Z111 Encounter for screening for respiratory tuberculosis: Secondary | ICD-10-CM | POA: Diagnosis not present

## 2018-11-14 DIAGNOSIS — G309 Alzheimer's disease, unspecified: Secondary | ICD-10-CM | POA: Diagnosis not present

## 2018-11-14 DIAGNOSIS — E039 Hypothyroidism, unspecified: Secondary | ICD-10-CM | POA: Diagnosis not present

## 2018-11-14 DIAGNOSIS — E7800 Pure hypercholesterolemia, unspecified: Secondary | ICD-10-CM | POA: Diagnosis not present

## 2018-11-14 DIAGNOSIS — R296 Repeated falls: Secondary | ICD-10-CM | POA: Diagnosis not present

## 2018-11-14 DIAGNOSIS — M818 Other osteoporosis without current pathological fracture: Secondary | ICD-10-CM | POA: Diagnosis not present

## 2018-11-14 DIAGNOSIS — R54 Age-related physical debility: Secondary | ICD-10-CM | POA: Diagnosis not present

## 2018-11-22 ENCOUNTER — Other Ambulatory Visit: Payer: Self-pay

## 2018-11-22 ENCOUNTER — Emergency Department (HOSPITAL_COMMUNITY): Payer: Medicare HMO

## 2018-11-22 ENCOUNTER — Emergency Department (HOSPITAL_COMMUNITY)
Admission: EM | Admit: 2018-11-22 | Discharge: 2018-11-22 | Disposition: A | Discharge status: Home / Self Care | Payer: Medicare HMO | Attending: Emergency Medicine | Admitting: Emergency Medicine

## 2018-11-22 DIAGNOSIS — E039 Hypothyroidism, unspecified: Secondary | ICD-10-CM | POA: Diagnosis not present

## 2018-11-22 DIAGNOSIS — M25521 Pain in right elbow: Secondary | ICD-10-CM | POA: Insufficient documentation

## 2018-11-22 DIAGNOSIS — Z853 Personal history of malignant neoplasm of breast: Secondary | ICD-10-CM | POA: Insufficient documentation

## 2018-11-22 DIAGNOSIS — W19XXXA Unspecified fall, initial encounter: Secondary | ICD-10-CM | POA: Diagnosis not present

## 2018-11-22 DIAGNOSIS — Z23 Encounter for immunization: Secondary | ICD-10-CM | POA: Insufficient documentation

## 2018-11-22 DIAGNOSIS — Z87891 Personal history of nicotine dependence: Secondary | ICD-10-CM | POA: Insufficient documentation

## 2018-11-22 DIAGNOSIS — S60211A Contusion of right wrist, initial encounter: Secondary | ICD-10-CM | POA: Insufficient documentation

## 2018-11-22 DIAGNOSIS — R52 Pain, unspecified: Secondary | ICD-10-CM | POA: Diagnosis not present

## 2018-11-22 DIAGNOSIS — Y92009 Unspecified place in unspecified non-institutional (private) residence as the place of occurrence of the external cause: Secondary | ICD-10-CM | POA: Diagnosis not present

## 2018-11-22 DIAGNOSIS — S61401A Unspecified open wound of right hand, initial encounter: Secondary | ICD-10-CM | POA: Insufficient documentation

## 2018-11-22 DIAGNOSIS — I5032 Chronic diastolic (congestive) heart failure: Secondary | ICD-10-CM | POA: Insufficient documentation

## 2018-11-22 DIAGNOSIS — I10 Essential (primary) hypertension: Secondary | ICD-10-CM | POA: Insufficient documentation

## 2018-11-22 DIAGNOSIS — W1830XA Fall on same level, unspecified, initial encounter: Secondary | ICD-10-CM | POA: Insufficient documentation

## 2018-11-22 DIAGNOSIS — Z95811 Presence of heart assist device: Secondary | ICD-10-CM | POA: Insufficient documentation

## 2018-11-22 DIAGNOSIS — S61421A Laceration with foreign body of right hand, initial encounter: Secondary | ICD-10-CM | POA: Diagnosis not present

## 2018-11-22 DIAGNOSIS — Z86718 Personal history of other venous thrombosis and embolism: Secondary | ICD-10-CM | POA: Insufficient documentation

## 2018-11-22 DIAGNOSIS — Z79899 Other long term (current) drug therapy: Secondary | ICD-10-CM | POA: Insufficient documentation

## 2018-11-22 DIAGNOSIS — S0990XA Unspecified injury of head, initial encounter: Secondary | ICD-10-CM | POA: Diagnosis not present

## 2018-11-22 DIAGNOSIS — Y939 Activity, unspecified: Secondary | ICD-10-CM | POA: Diagnosis not present

## 2018-11-22 DIAGNOSIS — M25531 Pain in right wrist: Secondary | ICD-10-CM | POA: Diagnosis present

## 2018-11-22 DIAGNOSIS — Y999 Unspecified external cause status: Secondary | ICD-10-CM | POA: Diagnosis not present

## 2018-11-22 DIAGNOSIS — M5489 Other dorsalgia: Secondary | ICD-10-CM | POA: Diagnosis not present

## 2018-11-22 DIAGNOSIS — S59901A Unspecified injury of right elbow, initial encounter: Secondary | ICD-10-CM | POA: Diagnosis not present

## 2018-11-22 MED ORDER — TETANUS-DIPHTH-ACELL PERTUSSIS 5-2.5-18.5 LF-MCG/0.5 IM SUSP
0.5000 mL | Freq: Once | INTRAMUSCULAR | Status: AC
  Administered 2018-11-22: 0.5 mL via INTRAMUSCULAR
  Filled 2018-11-22: qty 0.5

## 2018-11-22 NOTE — ED Provider Notes (Addendum)
[facility] EMERGENCY DEPARTMENT Provider Note   CSN: [id number] Arrival date & time: 11/22/18  1743     History   Chief Complaint Chief Complaint  Patient presents with  . Fall    HPI Erin [name] is a 85 y.o. female with a past medical history of hypertension, pacemaker in place, presents to ED for mechanical fall that occurred prior to arrival.  States that she was trying to open her closet door when she opened the door next to it which caused her to fall backwards.  She denies any head injury or loss of consciousness.  She reports pain in her right elbow and wrist.  The fall was unwitnessed, and she was helped up by staff at her facility.  She denies any headache, vision changes, numbness in arms or legs.  She was able to ambulate after the fall.  Denies chest pain, vomiting. Unknown last tetanus.  HPI  Past Medical History:  Diagnosis Date  . Anxiety   . BBB (bundle branch block)   . Breast cancer ([place])   . Carotid artery plaque   . Diastolic CHF ([employer])   . DVT (deep venous thrombosis) ([place])   . H/O: hysterectomy   . HTN (hypertension)   . Hyperlipidemia   . Hypothyroidism   . Memory loss   . Stroke [facility])     Patient Active Problem List   Diagnosis Date Noted  . 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 Pseudophakia of both eyes 05/27/2015  . Dyslipidemia 11/18/2014  . Mild cognitive disorder 09/02/2014  . Memory loss 09/02/2014  . Hx of completed stroke 08/29/2013  . Small vessel disease ([place]) 08/29/2013  . Atrioventricular block, complete ([place]) 01/31/2012  . Hypertension 01/25/2011  . Pacemaker 01/25/2011  . Resolved stroke 01/25/2011    Past Surgical History:  Procedure Laterality Date  . ABDOMINAL HYSTERECTOMY    . BREAST SURGERY    . CARDIAC CATHETERIZATION  2007  . CAROTID ENDARTERECTOMY    . CARPAL TUNNEL RELEASE    . cataracts    . MASTECTOMY    . PACEMAKER GENERATOR CHANGE  08/08/2014   MDT [name] dual chamber pacemaker  generator change by Dr Lovena Le  . PACEMAKER INSERTION    . PERMANENT PACEMAKER GENERATOR CHANGE N/A 08/07/2014   Procedure: PERMANENT PACEMAKER GENERATOR CHANGE;  Surgeon: Evans Lance, MD;  Location: [facility] CATH LAB;  Service: Cardiovascular;  Laterality: N/A;     OB History   No obstetric history on file.      Home Medications    Prior to Admission medications   Medication Sig Start Date End Date Taking? Authorizing Provider  acetaminophen (TYLENOL) 325 MG tablet Take 1 tablet (325 mg total) by mouth every 6 (six) hours as needed. 03/28/18   Venancio Poisson, NP  alendronate (FOSAMAX) 70 MG tablet  01/08/18   [provider]  aspirin 81 MG tablet Take 1 tablet (81 mg total) by mouth daily. 03/28/18   Venancio Poisson, NP  Calcium Carb-Cholecalciferol (CALCIUM 1000 + D PO) Take 1,000 Units by mouth daily.     [provider]  calcium carbonate (TUMS EX) 750 MG chewable tablet Chew 2 tablets by mouth daily.     [provider]  chlordiazePOXIDE (LIBRIUM) 5 MG capsule TAKE ONE CAPSULE BY MOUTH IN THE MORNING AND TAKE 2 CAPSULES BY MOUTH IN THE EVENING 11/09/18   Venancio Poisson, NP  CINNAMON PO Take 1,000 mg by mouth 2 (two) times daily.  [provider]  Coenzyme Q10 (COQ-10) 400 MG CAPS Take 400 tablets by mouth daily.     [provider]  CRANBERRY FRUIT PO Take 2,000 mg by mouth 2 (two) times daily.     [provider]  Fish Oil-Cholecalciferol (FISH OIL + D3 PO) Take 1 capsule by mouth 2 (two) times daily.     [provider]  furosemide (LASIX) 20 MG tablet Take one tablet daily.  May take one extra tablet daily as needed for shortness of breath. 12/29/17   Evans Lance, MD  Eloise Levels, Camillia sinensis, ([employer] GREEN TEA EXTRACT PO) Take 2 tablets by mouth 2 (two) times daily.     [provider]  levothyroxine (SYNTHROID, LEVOTHROID) 50 MCG tablet Take 50 mcg by mouth daily before breakfast. Takes every other  day.  Alternates with the other.    [provider]  levothyroxine (SYNTHROID, LEVOTHROID) 75 MCG tablet Take 75 mcg by mouth daily before breakfast.    [provider]  Misc Natural Products (OSTEO BI-FLEX ADV JOINT SHIELD PO) Take 1 tablet by mouth daily.    [provider]  simvastatin (ZOCOR) 20 MG tablet TAKE 1 TABLET (20 MG TOTAL) BY MOUTH DAILY. 11/24/15   Evans Lance, MD    Family History Family History  Problem Relation Age of Onset  . Unexplained death Mother 56  . Cancer Mother   . Hyperlipidemia Mother   . Stroke Mother   . Unexplained death Father 74    Social History Social History   Tobacco Use  . Smoking status: Former Smoker    Packs/day: 0.75    Types: Cigarettes  . Smokeless tobacco: Never Used  Substance Use Topics  . Alcohol use: No    Alcohol/week: 0.0 standard drinks  . Drug use: No     Allergies   Toprol xl [metoprolol succinate]   Review of Systems Review of Systems  Constitutional: Negative for appetite change, chills and fever.  HENT: Negative for ear pain, rhinorrhea, sneezing and sore throat.   Eyes: Negative for photophobia and visual disturbance.  Respiratory: Negative for cough, chest tightness, shortness of breath and wheezing.   Cardiovascular: Negative for chest pain and palpitations.  Gastrointestinal: Negative for abdominal pain, blood in stool, constipation, diarrhea, nausea and vomiting.  Genitourinary: Negative for dysuria, hematuria and urgency.  Musculoskeletal: Positive for arthralgias. Negative for myalgias.  Skin: Negative for rash.  Neurological: Negative for dizziness, weakness and light-headedness.     Physical Exam Updated Vital Signs BP 134/78   Pulse 66   Temp 98.3 F (36.8 C) (Oral)   Resp 20   Ht 5\' 4"  (1.626 m)   Wt 42.2 kg   SpO2 96%   BMI 15.96 kg/m   Physical Exam Vitals signs and nursing note reviewed.  Constitutional:      General: She is not in acute distress.     Appearance: She is well-developed.  HENT:     Head: Normocephalic and atraumatic.     Nose: Nose normal.  Eyes:     General: No scleral icterus.       Left eye: No discharge.     Conjunctiva/sclera: Conjunctivae normal.  Neck:     Musculoskeletal: Normal range of motion and neck supple.  Cardiovascular:     Rate and Rhythm: Normal rate and regular rhythm.     Heart sounds: Normal heart sounds. No murmur. No friction rub. No gallop.   Pulmonary:  Effort: Pulmonary effort is normal. No respiratory distress.     Breath sounds: Normal breath sounds.  Abdominal:     General: Bowel sounds are normal. There is no distension.     Palpations: Abdomen is soft.     Tenderness: There is no abdominal tenderness. There is no guarding.  Musculoskeletal: Normal range of motion.     Comments: Tenderness to palpation of right elbow and right wrist diffusely.  Able to perform range of motion of both joints with pain.  Skin tear noted on dorsum of right hand. No midline spinal tenderness present in lumbar, thoracic or cervical spine. No step-off palpated. No visible bruising, edema or temperature change noted. No objective signs of numbness present. No saddle anesthesia. 2+ DP pulses bilaterally. Sensation intact to light touch.  Skin:    General: Skin is warm and dry.     Findings: No rash.  Neurological:     General: No focal deficit present.     Mental Status: She is alert and oriented to person, place, and time.     Cranial Nerves: No cranial nerve deficit.     Sensory: No sensory deficit.     Motor: No weakness or abnormal muscle tone.     Coordination: Coordination normal.     Comments: Alert and oriented x3.  Pupils reactive. No facial asymmetry noted. Cranial nerves appear grossly intact. Sensation intact to light touch on face, BUE and BLE. Strength 5/5 in BUE and BLE.       ED Treatments / Results  Labs (all labs ordered are listed, but only abnormal results are displayed) Labs  Reviewed - No data to display  EKG EKG Interpretation  Date/Time:  Thursday November 22 2018 17:59:26 EST Ventricular Rate:  66 PR Interval:    QRS Duration: 170 QT Interval:  490 QTC Calculation: 514 R Axis:   -84 Text Interpretation:  paced rhythm When compared to prior, no signfiant chanegs seen. no STEMI Confirmed by Antony Blackbird [phone]) on 11/22/2018 6:08:11 PM   Radiology Dg Elbow Complete Right  Result Date: 11/22/2018 CLINICAL DATA:  85 year old female status post fall. EXAM: RIGHT ELBOW - COMPLETE 3+ VIEW COMPARISON:  None. FINDINGS: There is no evidence of fracture, dislocation, or joint effusion. There is no evidence of arthropathy or other focal bone abnormality. No discrete soft tissue injury. IMPRESSION: Negative. Electronically Signed   By: Genevie Ann M.D.   On: 11/22/2018 19:06   Dg Wrist Complete Right  Result Date: 11/22/2018 CLINICAL DATA:  Fall.  Wrist pain with laceration to the hand. EXAM: RIGHT WRIST - COMPLETE 3+ VIEW COMPARISON:  None. FINDINGS: Deformity of the distal radius suggests previous trauma. No definite fracture line can be identified on the current study. Widening of the scapholunate distance compatible with scapholunate dissociation. Fragment adjacent to the ulnar styloid suggests prior fracture. There is degenerative change in the first carpometacarpal joint. IMPRESSION: Deformity of the distal radius likely related to remote trauma as no definite fracture line could be identified on today's study. Correlation for previous wrist fracture recommended. If the patient has no previous history of wrist fracture, consider CT to further evaluate. Scapholunate dissociation. Degenerative changes in the first carpometacarpal joint. Electronically Signed   By: Misty Stanley M.D.   On: 11/22/2018 19:07    Procedures Procedures (including critical care time)  Medications Ordered in ED Medications - No data to display   Initial Impression / Assessment and Plan /  ED Course  I have reviewed the  triage vital signs and the nursing notes.  Pertinent labs & imaging results that were available during my care of the patient were reviewed by me and considered in my medical decision making (see chart for details).     85 year old female with a past medical history of hypertension presents to ED for mechanical fall that occurred prior to arrival.  States that she was trying to open a door when she fell backwards.  She denies any head injury or loss of consciousness.  She reports pain in her right elbow and wrist.  Denies headache, vision changes, numbness in arms or legs, anticoagulant use.  On my exam patient is alert and oriented x4.  No deficits on neurological exam noted.  She has tenderness to palpation of the right elbow, several skin tears noted and swelling of the right wrist.  No lacerations noted.  She has full active and passive range of motion at the right shoulder, elbow and wrist.  Area is neurovascularly intact.  X-ray of the wrist shows old fracture with no acute findings.  X-ray of the elbow is negative.  Wounds were dressed, cleaned and patient will be placed in a removable wrist splint.  We will have her follow-up with her PCP and return to ED for any severe worsening symptoms. Patient discussed with and seen by my attending, Dr. Sherry Ruffing.  Patient is hemodynamically stable, in NAD, and able to ambulate in the ED. Evaluation does not show pathology that would require ongoing emergent intervention or inpatient treatment. I explained the diagnosis to the patient. Pain has been managed and has no complaints prior to discharge. Patient is comfortable with above plan and is stable for discharge at this time. All questions were answered prior to disposition. Strict return precautions for returning to the ED were discussed. Encouraged follow up with PCP.    Portions of this note were generated with [employer]. Dictation errors may occur despite  best attempts at proofreading.  Final Clinical Impressions(s) / ED Diagnoses   Final diagnoses:  Fall in home, initial encounter  Contusion of right wrist, initial encounter    ED Discharge Orders    None         Delia Heady, PA-C 11/22/18 2126    Tegeler, Gwenyth Allegra, MD 11/23/18 [id number]

## 2018-11-22 NOTE — Discharge Instructions (Signed)
Please continue your home medications as previously prescribed. Follow-up with your primary care provider. Return to ED for worsening symptoms, additional injuries or falls, headache, chest pain, shortness of breath, numbness in arms or legs or trouble walking.

## 2018-11-22 NOTE — ED Notes (Signed)
Pt left in good condition in wheelchair. No s/sx distress noted

## 2018-11-22 NOTE — ED Triage Notes (Signed)
Per ems pt was getting shoes out of closet, tripped and fell backwards hitting head and back on wall. No LOC. Pt c/o pain to right arm 4/10. Pt not on blood thinners

## 2018-11-23 ENCOUNTER — Encounter: Payer: Self-pay | Admitting: Internal Medicine

## 2018-11-23 LAB — CUP PACEART REMOTE DEVICE CHECK
Battery Impedance: 505 Ohm
Brady Statistic AS VS Percent: 0 %
Date Time Interrogation Session: 20191126170521
Implantable Lead Implant Date: 20070316
Implantable Lead Implant Date: 20070316
Implantable Lead Location: 753859
Implantable Lead Location: 753860
Implantable Lead Serial Number: 478729
Implantable Pulse Generator Implant Date: 20150930
Lead Channel Impedance Value: 565 Ohm
Lead Channel Setting Pacing Amplitude: 2 V
Lead Channel Setting Pacing Amplitude: 2.5 V
Lead Channel Setting Pacing Pulse Width: 0.4 ms
Lead Channel Setting Sensing Sensitivity: 4 mV
MDC IDC LEAD SERIAL: 523845
MDC IDC MSMT BATTERY REMAINING LONGEVITY: 79 mo
MDC IDC MSMT BATTERY VOLTAGE: 2.79 V
MDC IDC MSMT LEADCHNL RA IMPEDANCE VALUE: 443 Ohm
MDC IDC MSMT LEADCHNL RA PACING THRESHOLD AMPLITUDE: 0.375 V
MDC IDC MSMT LEADCHNL RA PACING THRESHOLD PULSEWIDTH: 0.4 ms
MDC IDC MSMT LEADCHNL RV PACING THRESHOLD AMPLITUDE: 0.5 V
MDC IDC MSMT LEADCHNL RV PACING THRESHOLD PULSEWIDTH: 0.4 ms
MDC IDC STAT BRADY AP VP PERCENT: 5 %
MDC IDC STAT BRADY AP VS PERCENT: 0 %
MDC IDC STAT BRADY AS VP PERCENT: 95 %

## 2018-11-28 DIAGNOSIS — T148XXA Other injury of unspecified body region, initial encounter: Secondary | ICD-10-CM | POA: Diagnosis not present

## 2018-12-11 ENCOUNTER — Ambulatory Visit: Payer: Medicare HMO | Admitting: Internal Medicine

## 2018-12-11 ENCOUNTER — Encounter: Payer: Self-pay | Admitting: Internal Medicine

## 2018-12-11 VITALS — BP 132/62 | HR 100 | Ht 64.0 in | Wt 93.0 lb

## 2018-12-11 DIAGNOSIS — I442 Atrioventricular block, complete: Secondary | ICD-10-CM | POA: Diagnosis not present

## 2018-12-11 DIAGNOSIS — E785 Hyperlipidemia, unspecified: Secondary | ICD-10-CM | POA: Diagnosis not present

## 2018-12-11 DIAGNOSIS — Z950 Presence of cardiac pacemaker: Secondary | ICD-10-CM | POA: Diagnosis not present

## 2018-12-11 DIAGNOSIS — I10 Essential (primary) hypertension: Secondary | ICD-10-CM | POA: Diagnosis not present

## 2018-12-11 NOTE — Progress Notes (Signed)
HPI Erin [name] returns today for ongoing followup of CHB, s/p PPM insertion, HTN and dyslipidemia. She remains euvolemic and has not been in the hospital. No edema, chest pain or syncope. She has become more sedentary.  Allergies  Allergen Reactions  . Toprol Xl [Metoprolol Succinate] Other (See Comments)    UNKNOWN      Current Outpatient Medications  Medication Sig Dispense Refill  . acetaminophen (TYLENOL) 325 MG tablet Take 1 tablet (325 mg total) by mouth every 6 (six) hours as needed.    Marland Kitchen alendronate (FOSAMAX) 70 MG tablet     . aspirin 81 MG tablet Take 1 tablet (81 mg total) by mouth daily. 30 tablet   . Calcium Carb-Cholecalciferol (CALCIUM 1000 + D PO) Take 1,000 Units by mouth daily.     . calcium carbonate (TUMS EX) 750 MG chewable tablet Chew 2 tablets by mouth daily.     . chlordiazePOXIDE (LIBRIUM) 5 MG capsule TAKE ONE CAPSULE BY MOUTH IN THE MORNING AND TAKE 2 CAPSULES BY MOUTH IN THE EVENING 60 capsule 4  . CINNAMON PO Take 1,000 mg by mouth 2 (two) times daily.    . Coenzyme Q10 (COQ-10) 400 MG CAPS Take 400 tablets by mouth daily.     Marland Kitchen CRANBERRY FRUIT PO Take 2,000 mg by mouth 2 (two) times daily.     . Fish Oil-Cholecalciferol (FISH OIL + D3 PO) Take 1 capsule by mouth 2 (two) times daily.     . furosemide (LASIX) 20 MG tablet Take one tablet daily.  May take one extra tablet daily as needed for shortness of breath. 90 tablet 3  . Green Tea, Camillia sinensis, ([employer] GREEN TEA EXTRACT PO) Take 2 tablets by mouth 2 (two) times daily.     Marland Kitchen levothyroxine (SYNTHROID, LEVOTHROID) 50 MCG tablet Take 50 mcg by mouth daily before breakfast. Takes every other day.  Alternates with the other.    . levothyroxine (SYNTHROID, LEVOTHROID) 75 MCG tablet Take 75 mcg by mouth daily before breakfast.    . Misc Natural Products (OSTEO BI-FLEX ADV JOINT SHIELD PO) Take 1 tablet by mouth daily.    . simvastatin (ZOCOR) 20 MG tablet TAKE 1 TABLET (20 MG TOTAL) BY MOUTH DAILY.  90 tablet 0   No current facility-administered medications for this visit.      Past Medical History:  Diagnosis Date  . Anxiety   . BBB (bundle branch block)   . Breast cancer ([place])   . Carotid artery plaque   . Diastolic CHF ([place])   . DVT (deep venous thrombosis) ([place])   . H/O: hysterectomy   . HTN (hypertension)   . Hyperlipidemia   . Hypothyroidism   . Memory loss   . Stroke ([place])     ROS:   All systems reviewed and negative except as noted in the HPI.   Past Surgical History:  Procedure Laterality Date  . ABDOMINAL HYSTERECTOMY    . BREAST SURGERY    . CARDIAC CATHETERIZATION  2007  . CAROTID ENDARTERECTOMY    . CARPAL TUNNEL RELEASE    . cataracts    . MASTECTOMY    . PACEMAKER GENERATOR CHANGE  08/08/2014   MDT [name] dual chamber pacemaker generator change by Dr Lovena Le  . PACEMAKER INSERTION    . PERMANENT PACEMAKER GENERATOR CHANGE N/A 08/07/2014   Procedure: PERMANENT PACEMAKER GENERATOR CHANGE;  Surgeon: Evans Lance, MD;  Location[facility] CATH LAB;  Service: Cardiovascular;  Laterality: N/A;  Family History  Problem Relation Age of Onset  . Unexplained death Mother 16  . Cancer Mother   . Hyperlipidemia Mother   . Stroke Mother   . Unexplained death Father 79     Social History   Socioeconomic History  . Marital status: Widowed    Spouse name: Not on file  . Number of children: Not on file  . Years of education: 12th  . Highest education level: Not on file  Occupational History  . Occupation: Retired  Scientific laboratory technician  . Financial resource strain: Not on file  . Food insecurity:    Worry: Not on file    Inability: Not on file  . Transportation needs:    Medical: Not on file    Non-medical: Not on file  Tobacco Use  . Smoking status: Former Smoker    Packs/day: 0.75    Types: Cigarettes  . Smokeless tobacco: Never Used  Substance and Sexual Activity  . Alcohol use: No    Alcohol/week: 0.0 standard drinks  . Drug use: No  . Sexual  activity: Not on file  Lifestyle  . Physical activity:    Days per week: Not on file    Minutes per session: Not on file  . Stress: Not on file  Relationships  . Social connections:    Talks on phone: Not on file    Gets together: Not on file    Attends religious service: Not on file    Active member of club or organization: Not on file    Attends meetings of clubs or organizations: Not on file    Relationship status: Not on file  . Intimate partner violence:    Fear of current or ex partner: Not on file    Emotionally abused: Not on file    Physically abused: Not on file    Forced sexual activity: Not on file  Other Topics Concern  . Not on file  Social History Narrative   Patient is a widow.    Patient lives with daughter.   Patient has 2 adopted children.   Patient is retired.   Patient is right handed.   Patient has a high school education.           BP 132/62   Pulse 100   Ht 5\' 4"  (1.626 m)   Wt 93 lb (42.2 kg)   BMI 15.96 kg/m   Physical Exam:  frail appearing elderly woman, NAD HEENT: Unremarkable Neck:  6 cm JVD, no thyromegally Lymphatics:  No adenopathy Back:  No CVA tenderness Lungs:  Clear with no wheezes HEART:  Regular rate rhythm, no murmurs, no rubs, no clicks Abd:  soft, positive bowel sounds, no organomegally, no rebound, no guarding Ext:  2 plus pulses, no edema, no cyanosis, no clubbing Skin:  No rashes no nodules Neuro:  CN II through XII intact, motor grossly intact   DEVICE  Normal device function.  See PaceArt for details.   Assess/Plan:  1. CHB- she is asymptomatic s/p PPM insertion 2. PPM - her medtronic single chamber PPM is working normally.  3. HTN - Her blood pressure is well controlled.   Mikle Bosworth.D.

## 2018-12-11 NOTE — Patient Instructions (Signed)
Medication Instructions:  Your physician recommends that you continue on your current medications as directed. Please refer to the Current Medication list given to you today.  Labwork: None ordered.  Testing/Procedures: None ordered.  Follow-Up: Your physician wants you to follow-up in: one year with Dr. Lovena Le.  You will receive a reminder letter in the mail two months in advance. If you don't receive a letter, please call our office to schedule the follow-up appointment.  Remote monitoring is used to monitor your Pacemaker from home. This monitoring reduces the number of office visits required to check your device to one time per year. It allows [place] to keep an eye on the functioning of your device to ensure it is working properly. You are scheduled for a device check from home on 01/01/2019. You may send your transmission at any time that day. If you have a wireless device, the transmission will be sent automatically. After your physician reviews your transmission, you will receive a postcard with your next transmission date.  Any Other Special Instructions Will Be Listed Below (If Applicable).  If you need a refill on your cardiac medications before your next appointment, please call your pharmacy.

## 2018-12-12 ENCOUNTER — Ambulatory Visit: Payer: Medicare HMO | Admitting: Adult Health

## 2018-12-12 LAB — CUP PACEART INCLINIC DEVICE CHECK
Battery Impedance: 452 Ohm
Battery Remaining Longevity: 83 mo
Battery Voltage: 2.78 V
Brady Statistic AP VP Percent: 4 %
Brady Statistic AS VS Percent: 0 %
Date Time Interrogation Session: 20200204211301
Implantable Lead Implant Date: 20070316
Implantable Lead Location: 753859
Implantable Lead Location: 753860
Implantable Lead Model: 4469
Implantable Lead Serial Number: 523845
Implantable Pulse Generator Implant Date: 20150930
Lead Channel Pacing Threshold Amplitude: 0.5 V
Lead Channel Pacing Threshold Amplitude: 0.5 V
Lead Channel Pacing Threshold Pulse Width: 0.4 ms
Lead Channel Setting Pacing Amplitude: 2 V
Lead Channel Setting Pacing Amplitude: 2.5 V
Lead Channel Setting Pacing Pulse Width: 0.4 ms
MDC IDC LEAD IMPLANT DT: 20070316
MDC IDC LEAD SERIAL: 478729
MDC IDC MSMT LEADCHNL RA IMPEDANCE VALUE: 469 Ohm
MDC IDC MSMT LEADCHNL RA PACING THRESHOLD PULSEWIDTH: 0.4 ms
MDC IDC MSMT LEADCHNL RA SENSING INTR AMPL: 4 mV
MDC IDC MSMT LEADCHNL RV IMPEDANCE VALUE: 571 Ohm
MDC IDC SET LEADCHNL RV SENSING SENSITIVITY: 4 mV
MDC IDC STAT BRADY AP VS PERCENT: 0 %
MDC IDC STAT BRADY AS VP PERCENT: 96 %

## 2018-12-13 ENCOUNTER — Telehealth: Payer: Self-pay

## 2018-12-13 NOTE — Telephone Encounter (Signed)
Pt called to cancel the same day.Pt had stomach virus.

## 2018-12-19 ENCOUNTER — Telehealth: Payer: Self-pay | Admitting: Internal Medicine

## 2018-12-19 NOTE — Telephone Encounter (Signed)
Returned call to "friend" she is not listed in dpr ([place] area) she states that she has POA. She states that she wants to add allergies from Pt's PCP office. Lisinopril-cough and Tetracycline HCL  Updated chart allergies

## 2018-12-19 NOTE — Telephone Encounter (Signed)
New Message          Patient's family friend is calling today to add some medical information. Patient can't take Lisisinopril, it gives patient a cough. Patient can not take HCL patient can't take this either.

## 2018-12-29 DIAGNOSIS — L89150 Pressure ulcer of sacral region, unstageable: Secondary | ICD-10-CM | POA: Diagnosis not present

## 2018-12-29 DIAGNOSIS — M810 Age-related osteoporosis without current pathological fracture: Secondary | ICD-10-CM | POA: Diagnosis not present

## 2018-12-29 DIAGNOSIS — F419 Anxiety disorder, unspecified: Secondary | ICD-10-CM | POA: Diagnosis not present

## 2018-12-29 DIAGNOSIS — F0280 Dementia in other diseases classified elsewhere without behavioral disturbance: Secondary | ICD-10-CM | POA: Diagnosis not present

## 2018-12-29 DIAGNOSIS — G309 Alzheimer's disease, unspecified: Secondary | ICD-10-CM | POA: Diagnosis not present

## 2018-12-29 DIAGNOSIS — E039 Hypothyroidism, unspecified: Secondary | ICD-10-CM | POA: Diagnosis not present

## 2018-12-29 DIAGNOSIS — Z9181 History of falling: Secondary | ICD-10-CM | POA: Diagnosis not present

## 2018-12-29 DIAGNOSIS — Z4800 Encounter for change or removal of nonsurgical wound dressing: Secondary | ICD-10-CM | POA: Diagnosis not present

## 2019-01-01 ENCOUNTER — Encounter: Payer: Medicare HMO | Admitting: *Deleted

## 2019-01-01 DIAGNOSIS — F0280 Dementia in other diseases classified elsewhere without behavioral disturbance: Secondary | ICD-10-CM | POA: Diagnosis not present

## 2019-01-01 DIAGNOSIS — M810 Age-related osteoporosis without current pathological fracture: Secondary | ICD-10-CM | POA: Diagnosis not present

## 2019-01-01 DIAGNOSIS — Z4800 Encounter for change or removal of nonsurgical wound dressing: Secondary | ICD-10-CM | POA: Diagnosis not present

## 2019-01-01 DIAGNOSIS — Z9181 History of falling: Secondary | ICD-10-CM | POA: Diagnosis not present

## 2019-01-01 DIAGNOSIS — E039 Hypothyroidism, unspecified: Secondary | ICD-10-CM | POA: Diagnosis not present

## 2019-01-01 DIAGNOSIS — G309 Alzheimer's disease, unspecified: Secondary | ICD-10-CM | POA: Diagnosis not present

## 2019-01-01 DIAGNOSIS — F419 Anxiety disorder, unspecified: Secondary | ICD-10-CM | POA: Diagnosis not present

## 2019-01-01 DIAGNOSIS — L89150 Pressure ulcer of sacral region, unstageable: Secondary | ICD-10-CM | POA: Diagnosis not present

## 2019-01-02 ENCOUNTER — Telehealth: Payer: Self-pay

## 2019-01-02 DIAGNOSIS — M810 Age-related osteoporosis without current pathological fracture: Secondary | ICD-10-CM | POA: Diagnosis not present

## 2019-01-02 DIAGNOSIS — Z9181 History of falling: Secondary | ICD-10-CM | POA: Diagnosis not present

## 2019-01-02 DIAGNOSIS — F0280 Dementia in other diseases classified elsewhere without behavioral disturbance: Secondary | ICD-10-CM | POA: Diagnosis not present

## 2019-01-02 DIAGNOSIS — F419 Anxiety disorder, unspecified: Secondary | ICD-10-CM | POA: Diagnosis not present

## 2019-01-02 DIAGNOSIS — Z4800 Encounter for change or removal of nonsurgical wound dressing: Secondary | ICD-10-CM | POA: Diagnosis not present

## 2019-01-02 DIAGNOSIS — G309 Alzheimer's disease, unspecified: Secondary | ICD-10-CM | POA: Diagnosis not present

## 2019-01-02 DIAGNOSIS — L89150 Pressure ulcer of sacral region, unstageable: Secondary | ICD-10-CM | POA: Diagnosis not present

## 2019-01-02 DIAGNOSIS — E039 Hypothyroidism, unspecified: Secondary | ICD-10-CM | POA: Diagnosis not present

## 2019-01-02 NOTE — Telephone Encounter (Signed)
Spoke with patient's nurse to remind of missed remote transmission.

## 2019-01-03 ENCOUNTER — Encounter (HOSPITAL_BASED_OUTPATIENT_CLINIC_OR_DEPARTMENT_OTHER): Payer: Self-pay

## 2019-01-03 ENCOUNTER — Other Ambulatory Visit: Payer: Self-pay

## 2019-01-03 ENCOUNTER — Emergency Department (HOSPITAL_BASED_OUTPATIENT_CLINIC_OR_DEPARTMENT_OTHER)
Admission: EM | Admit: 2019-01-03 | Discharge: 2019-01-03 | Disposition: A | Discharge status: Home / Self Care | Payer: Medicare HMO | Attending: Emergency Medicine | Admitting: Emergency Medicine

## 2019-01-03 DIAGNOSIS — W010XXA Fall on same level from slipping, tripping and stumbling without subsequent striking against object, initial encounter: Secondary | ICD-10-CM | POA: Diagnosis not present

## 2019-01-03 DIAGNOSIS — Y9389 Activity, other specified: Secondary | ICD-10-CM | POA: Insufficient documentation

## 2019-01-03 DIAGNOSIS — Z0000 Encounter for general adult medical examination without abnormal findings: Secondary | ICD-10-CM | POA: Diagnosis not present

## 2019-01-03 DIAGNOSIS — Z8673 Personal history of transient ischemic attack (TIA), and cerebral infarction without residual deficits: Secondary | ICD-10-CM | POA: Diagnosis not present

## 2019-01-03 DIAGNOSIS — Y998 Other external cause status: Secondary | ICD-10-CM | POA: Diagnosis not present

## 2019-01-03 DIAGNOSIS — I110 Hypertensive heart disease with heart failure: Secondary | ICD-10-CM | POA: Diagnosis not present

## 2019-01-03 DIAGNOSIS — Z950 Presence of cardiac pacemaker: Secondary | ICD-10-CM | POA: Diagnosis not present

## 2019-01-03 DIAGNOSIS — Z87891 Personal history of nicotine dependence: Secondary | ICD-10-CM | POA: Diagnosis not present

## 2019-01-03 DIAGNOSIS — R5381 Other malaise: Secondary | ICD-10-CM | POA: Diagnosis not present

## 2019-01-03 DIAGNOSIS — Z853 Personal history of malignant neoplasm of breast: Secondary | ICD-10-CM | POA: Diagnosis not present

## 2019-01-03 DIAGNOSIS — Z043 Encounter for examination and observation following other accident: Secondary | ICD-10-CM | POA: Insufficient documentation

## 2019-01-03 DIAGNOSIS — Y92129 Unspecified place in nursing home as the place of occurrence of the external cause: Secondary | ICD-10-CM | POA: Insufficient documentation

## 2019-01-03 DIAGNOSIS — W19XXXA Unspecified fall, initial encounter: Secondary | ICD-10-CM | POA: Diagnosis not present

## 2019-01-03 DIAGNOSIS — Z79899 Other long term (current) drug therapy: Secondary | ICD-10-CM | POA: Insufficient documentation

## 2019-01-03 DIAGNOSIS — E039 Hypothyroidism, unspecified: Secondary | ICD-10-CM | POA: Diagnosis not present

## 2019-01-03 DIAGNOSIS — T1490XA Injury, unspecified, initial encounter: Secondary | ICD-10-CM | POA: Diagnosis not present

## 2019-01-03 DIAGNOSIS — Z7982 Long term (current) use of aspirin: Secondary | ICD-10-CM | POA: Diagnosis not present

## 2019-01-03 DIAGNOSIS — I5030 Unspecified diastolic (congestive) heart failure: Secondary | ICD-10-CM | POA: Diagnosis not present

## 2019-01-03 NOTE — ED Notes (Signed)
Pt daughter (POA) picked daughter up and transferred back to facility. Pt daughter voiced understanding of d/c instructions.

## 2019-01-03 NOTE — ED Provider Notes (Signed)
[facility] Provider Note   CSN: [id number] Arrival date & time:       History   Chief Complaint Chief Complaint  Patient presents with  . Fall    HPI Erin [name] is a 85 y.o. female.     Pt presents to the ED today as a fall.  She said she got tangled up in a phone cord.  The pt did not hurt herself when she fell, but the facility said she had to be checked out.      Past Medical History:  Diagnosis Date  . Anxiety   . BBB (bundle branch block)   . Breast cancer ([place])   . Carotid artery plaque   . Diastolic CHF ([place])   . DVT (deep venous thrombosis) ([place])   . H/O: hysterectomy   . HTN (hypertension)   . Hyperlipidemia   . Hypothyroidism   . Memory loss   . Stroke [facility])     Patient Active Problem List   Diagnosis Date Noted  . 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 Pseudophakia of both eyes 05/27/2015  . Dyslipidemia 11/18/2014  . Mild cognitive disorder 09/02/2014  . Memory loss 09/02/2014  . Hx of completed stroke 08/29/2013  . Small vessel disease ([place]) 08/29/2013  . Atrioventricular block, complete ([place]) 01/31/2012  . Hypertension 01/25/2011  . Pacemaker 01/25/2011  . Resolved stroke 01/25/2011    Past Surgical History:  Procedure Laterality Date  . ABDOMINAL HYSTERECTOMY    . BREAST SURGERY    . CARDIAC CATHETERIZATION  2007  . CAROTID ENDARTERECTOMY    . CARPAL TUNNEL RELEASE    . cataracts    . MASTECTOMY    . PACEMAKER GENERATOR CHANGE  08/08/2014   MDT [name] dual chamber pacemaker generator change by Dr Lovena Le  . PACEMAKER INSERTION    . PERMANENT PACEMAKER GENERATOR CHANGE N/A 08/07/2014   Procedure: PERMANENT PACEMAKER GENERATOR CHANGE;  Surgeon: Evans Lance, MD;  Location: [facility] CATH LAB;  Service: Cardiovascular;  Laterality: N/A;     OB History   No obstetric history on file.      Home Medications    Prior to Admission medications   Medication Sig Start Date End Date Taking?  Authorizing Provider  acetaminophen (TYLENOL) 325 MG tablet Take 1 tablet (325 mg total) by mouth every 6 (six) hours as needed. 03/28/18   Venancio Poisson, NP  alendronate (FOSAMAX) 70 MG tablet  01/08/18   [provider]  aspirin 81 MG tablet Take 1 tablet (81 mg total) by mouth daily. 03/28/18   Venancio Poisson, NP  Calcium Carb-Cholecalciferol (CALCIUM 1000 + D PO) Take 1,000 Units by mouth daily.     [provider]  calcium carbonate (TUMS EX) 750 MG chewable tablet Chew 2 tablets by mouth daily.     [provider]  chlordiazePOXIDE (LIBRIUM) 5 MG capsule TAKE ONE CAPSULE BY MOUTH IN THE MORNING AND TAKE 2 CAPSULES BY MOUTH IN THE EVENING 11/09/18   Venancio Poisson, NP  CINNAMON PO Take 1,000 mg by mouth 2 (two) times daily.    [provider]  Coenzyme Q10 (COQ-10) 400 MG CAPS Take 400 tablets by mouth daily.     [provider]  CRANBERRY FRUIT PO Take 2,000 mg by mouth 2 (two) times daily.     [provider]  Fish Oil-Cholecalciferol (FISH OIL + D3 PO) Take 1 capsule by mouth 2 (two) times daily.  [provider]  furosemide (LASIX) 20 MG tablet Take one tablet daily.  May take one extra tablet daily as needed for shortness of breath. 12/29/17   Evans Lance, MD  Eloise Levels, Camillia sinensis, ([employer] GREEN TEA EXTRACT PO) Take 2 tablets by mouth 2 (two) times daily.     [provider]  levothyroxine (SYNTHROID, LEVOTHROID) 50 MCG tablet Take 50 mcg by mouth daily before breakfast. Takes every other day.  Alternates with the other.    [provider]  levothyroxine (SYNTHROID, LEVOTHROID) 75 MCG tablet Take 75 mcg by mouth daily before breakfast.    [provider]  Misc Natural Products (OSTEO BI-FLEX ADV JOINT SHIELD PO) Take 1 tablet by mouth daily.    [provider]  simvastatin (ZOCOR) 20 MG tablet TAKE 1 TABLET (20 MG TOTAL) BY MOUTH DAILY. 11/24/15   Evans Lance, MD     Family History Family History  Problem Relation Age of Onset  . Unexplained death Mother 14  . Cancer Mother   . Hyperlipidemia Mother   . Stroke Mother   . Unexplained death Father 44    Social History Social History   Tobacco Use  . Smoking status: Former Smoker    Packs/day: 0.75    Types: Cigarettes  . Smokeless tobacco: Never Used  Substance Use Topics  . Alcohol use: No    Alcohol/week: 0.0 standard drinks  . Drug use: No     Allergies   Lisinopril; Tetracyclines & related; and Toprol xl [metoprolol succinate]   Review of Systems Review of Systems  All other systems reviewed and are negative.    Physical Exam Updated Vital Signs BP 133/60 (BP Location: Right Arm)   Pulse 67   Temp 98.3 F (36.8 C) (Oral)   Resp 20   SpO2 97%   Physical Exam Vitals signs and nursing note reviewed.  Constitutional:      Appearance: Normal appearance.  HENT:     Head: Normocephalic and atraumatic.     Right Ear: External ear normal.     Left Ear: External ear normal.     Nose: Nose normal.     Mouth/Throat:     Mouth: Mucous membranes are moist.  Eyes:     Extraocular Movements: Extraocular movements intact.     Pupils: Pupils are equal, round, and reactive to light.  Neck:     Musculoskeletal: Normal range of motion and neck supple.  Cardiovascular:     Rate and Rhythm: Normal rate and regular rhythm.     Pulses: Normal pulses.     Heart sounds: Normal heart sounds.  Pulmonary:     Effort: Pulmonary effort is normal.     Breath sounds: Normal breath sounds.  Abdominal:     General: Abdomen is flat.  Musculoskeletal: Normal range of motion.  Skin:    General: Skin is warm.     Capillary Refill: Capillary refill takes less than 2 seconds.  Neurological:     General: No focal deficit present.     Mental Status: She is alert. Mental status is at baseline.  Psychiatric:        Mood and Affect: Mood normal.        Behavior: Behavior normal.       ED Treatments / Results  Labs (all labs ordered are listed, but only abnormal results are displayed) Labs Reviewed - No data to display  EKG None  Radiology No results found.  Procedures Procedures (  including critical care time)  Medications Ordered in ED Medications - No data to display   Initial Impression / Assessment and Plan / ED Course  I have reviewed the triage vital signs and the nursing notes.  Pertinent labs & imaging results that were available during my care of the patient were reviewed by me and considered in my medical decision making (see chart for details).    Pt does not have any pain on exam.  She is able to walk with her walker without any difficulty or pain.  She is not on blood thinners.  She does not need any xrays.  She is stable for d/c.  Final Clinical Impressions(s) / ED Diagnoses   Final diagnoses:  Fall, initial encounter    ED Discharge Orders    None       Isla Pence, MD 01/03/19 1344

## 2019-01-03 NOTE — ED Notes (Signed)
Called [facility] and requested transport back to [facility]

## 2019-01-03 NOTE — ED Triage Notes (Signed)
Pt here via [facility] after a witnessed fall. Pt has dementia. Ambulates with walker. Pt denies pain. Pt from [facility].

## 2019-01-05 DIAGNOSIS — M810 Age-related osteoporosis without current pathological fracture: Secondary | ICD-10-CM | POA: Diagnosis not present

## 2019-01-05 DIAGNOSIS — Z4800 Encounter for change or removal of nonsurgical wound dressing: Secondary | ICD-10-CM | POA: Diagnosis not present

## 2019-01-05 DIAGNOSIS — F0280 Dementia in other diseases classified elsewhere without behavioral disturbance: Secondary | ICD-10-CM | POA: Diagnosis not present

## 2019-01-05 DIAGNOSIS — E039 Hypothyroidism, unspecified: Secondary | ICD-10-CM | POA: Diagnosis not present

## 2019-01-05 DIAGNOSIS — L89150 Pressure ulcer of sacral region, unstageable: Secondary | ICD-10-CM | POA: Diagnosis not present

## 2019-01-05 DIAGNOSIS — F419 Anxiety disorder, unspecified: Secondary | ICD-10-CM | POA: Diagnosis not present

## 2019-01-05 DIAGNOSIS — G309 Alzheimer's disease, unspecified: Secondary | ICD-10-CM | POA: Diagnosis not present

## 2019-01-05 DIAGNOSIS — Z9181 History of falling: Secondary | ICD-10-CM | POA: Diagnosis not present

## 2019-01-07 DIAGNOSIS — F419 Anxiety disorder, unspecified: Secondary | ICD-10-CM | POA: Diagnosis not present

## 2019-01-07 DIAGNOSIS — M810 Age-related osteoporosis without current pathological fracture: Secondary | ICD-10-CM | POA: Diagnosis not present

## 2019-01-07 DIAGNOSIS — F0280 Dementia in other diseases classified elsewhere without behavioral disturbance: Secondary | ICD-10-CM | POA: Diagnosis not present

## 2019-01-07 DIAGNOSIS — Z4800 Encounter for change or removal of nonsurgical wound dressing: Secondary | ICD-10-CM | POA: Diagnosis not present

## 2019-01-07 DIAGNOSIS — L89150 Pressure ulcer of sacral region, unstageable: Secondary | ICD-10-CM | POA: Diagnosis not present

## 2019-01-07 DIAGNOSIS — E039 Hypothyroidism, unspecified: Secondary | ICD-10-CM | POA: Diagnosis not present

## 2019-01-07 DIAGNOSIS — G309 Alzheimer's disease, unspecified: Secondary | ICD-10-CM | POA: Diagnosis not present

## 2019-01-07 DIAGNOSIS — Z9181 History of falling: Secondary | ICD-10-CM | POA: Diagnosis not present

## 2019-01-09 ENCOUNTER — Encounter: Payer: Self-pay | Admitting: Cardiology

## 2019-01-10 DIAGNOSIS — G309 Alzheimer's disease, unspecified: Secondary | ICD-10-CM | POA: Diagnosis not present

## 2019-01-10 DIAGNOSIS — F419 Anxiety disorder, unspecified: Secondary | ICD-10-CM | POA: Diagnosis not present

## 2019-01-10 DIAGNOSIS — Z4800 Encounter for change or removal of nonsurgical wound dressing: Secondary | ICD-10-CM | POA: Diagnosis not present

## 2019-01-10 DIAGNOSIS — M810 Age-related osteoporosis without current pathological fracture: Secondary | ICD-10-CM | POA: Diagnosis not present

## 2019-01-10 DIAGNOSIS — F0280 Dementia in other diseases classified elsewhere without behavioral disturbance: Secondary | ICD-10-CM | POA: Diagnosis not present

## 2019-01-10 DIAGNOSIS — L89150 Pressure ulcer of sacral region, unstageable: Secondary | ICD-10-CM | POA: Diagnosis not present

## 2019-01-10 DIAGNOSIS — Z9181 History of falling: Secondary | ICD-10-CM | POA: Diagnosis not present

## 2019-01-10 DIAGNOSIS — E039 Hypothyroidism, unspecified: Secondary | ICD-10-CM | POA: Diagnosis not present

## 2019-01-12 DIAGNOSIS — M810 Age-related osteoporosis without current pathological fracture: Secondary | ICD-10-CM | POA: Diagnosis not present

## 2019-01-12 DIAGNOSIS — F0280 Dementia in other diseases classified elsewhere without behavioral disturbance: Secondary | ICD-10-CM | POA: Diagnosis not present

## 2019-01-12 DIAGNOSIS — L89150 Pressure ulcer of sacral region, unstageable: Secondary | ICD-10-CM | POA: Diagnosis not present

## 2019-01-12 DIAGNOSIS — F419 Anxiety disorder, unspecified: Secondary | ICD-10-CM | POA: Diagnosis not present

## 2019-01-12 DIAGNOSIS — E039 Hypothyroidism, unspecified: Secondary | ICD-10-CM | POA: Diagnosis not present

## 2019-01-12 DIAGNOSIS — Z9181 History of falling: Secondary | ICD-10-CM | POA: Diagnosis not present

## 2019-01-12 DIAGNOSIS — Z4800 Encounter for change or removal of nonsurgical wound dressing: Secondary | ICD-10-CM | POA: Diagnosis not present

## 2019-01-12 DIAGNOSIS — G309 Alzheimer's disease, unspecified: Secondary | ICD-10-CM | POA: Diagnosis not present

## 2019-01-15 DIAGNOSIS — G309 Alzheimer's disease, unspecified: Secondary | ICD-10-CM | POA: Diagnosis not present

## 2019-01-15 DIAGNOSIS — E039 Hypothyroidism, unspecified: Secondary | ICD-10-CM | POA: Diagnosis not present

## 2019-01-15 DIAGNOSIS — F419 Anxiety disorder, unspecified: Secondary | ICD-10-CM | POA: Diagnosis not present

## 2019-01-15 DIAGNOSIS — F0280 Dementia in other diseases classified elsewhere without behavioral disturbance: Secondary | ICD-10-CM | POA: Diagnosis not present

## 2019-01-15 DIAGNOSIS — Z9181 History of falling: Secondary | ICD-10-CM | POA: Diagnosis not present

## 2019-01-15 DIAGNOSIS — L89150 Pressure ulcer of sacral region, unstageable: Secondary | ICD-10-CM | POA: Diagnosis not present

## 2019-01-15 DIAGNOSIS — M810 Age-related osteoporosis without current pathological fracture: Secondary | ICD-10-CM | POA: Diagnosis not present

## 2019-01-15 DIAGNOSIS — Z4800 Encounter for change or removal of nonsurgical wound dressing: Secondary | ICD-10-CM | POA: Diagnosis not present

## 2019-01-16 DIAGNOSIS — M810 Age-related osteoporosis without current pathological fracture: Secondary | ICD-10-CM | POA: Diagnosis not present

## 2019-01-16 DIAGNOSIS — G309 Alzheimer's disease, unspecified: Secondary | ICD-10-CM | POA: Diagnosis not present

## 2019-01-16 DIAGNOSIS — F419 Anxiety disorder, unspecified: Secondary | ICD-10-CM | POA: Diagnosis not present

## 2019-01-16 DIAGNOSIS — E039 Hypothyroidism, unspecified: Secondary | ICD-10-CM | POA: Diagnosis not present

## 2019-01-16 DIAGNOSIS — Z4800 Encounter for change or removal of nonsurgical wound dressing: Secondary | ICD-10-CM | POA: Diagnosis not present

## 2019-01-16 DIAGNOSIS — L89150 Pressure ulcer of sacral region, unstageable: Secondary | ICD-10-CM | POA: Diagnosis not present

## 2019-01-16 DIAGNOSIS — F0280 Dementia in other diseases classified elsewhere without behavioral disturbance: Secondary | ICD-10-CM | POA: Diagnosis not present

## 2019-01-16 DIAGNOSIS — Z9181 History of falling: Secondary | ICD-10-CM | POA: Diagnosis not present

## 2019-01-17 DIAGNOSIS — Z4800 Encounter for change or removal of nonsurgical wound dressing: Secondary | ICD-10-CM | POA: Diagnosis not present

## 2019-01-17 DIAGNOSIS — M810 Age-related osteoporosis without current pathological fracture: Secondary | ICD-10-CM | POA: Diagnosis not present

## 2019-01-17 DIAGNOSIS — L89150 Pressure ulcer of sacral region, unstageable: Secondary | ICD-10-CM | POA: Diagnosis not present

## 2019-01-17 DIAGNOSIS — G309 Alzheimer's disease, unspecified: Secondary | ICD-10-CM | POA: Diagnosis not present

## 2019-01-17 DIAGNOSIS — L8990 Pressure ulcer of unspecified site, unspecified stage: Secondary | ICD-10-CM | POA: Diagnosis not present

## 2019-01-17 DIAGNOSIS — Z9181 History of falling: Secondary | ICD-10-CM | POA: Diagnosis not present

## 2019-01-17 DIAGNOSIS — E039 Hypothyroidism, unspecified: Secondary | ICD-10-CM | POA: Diagnosis not present

## 2019-01-17 DIAGNOSIS — F419 Anxiety disorder, unspecified: Secondary | ICD-10-CM | POA: Diagnosis not present

## 2019-01-17 DIAGNOSIS — R509 Fever, unspecified: Secondary | ICD-10-CM | POA: Diagnosis not present

## 2019-01-17 DIAGNOSIS — F0280 Dementia in other diseases classified elsewhere without behavioral disturbance: Secondary | ICD-10-CM | POA: Diagnosis not present

## 2019-01-18 DIAGNOSIS — F0280 Dementia in other diseases classified elsewhere without behavioral disturbance: Secondary | ICD-10-CM | POA: Diagnosis not present

## 2019-01-18 DIAGNOSIS — F419 Anxiety disorder, unspecified: Secondary | ICD-10-CM | POA: Diagnosis not present

## 2019-01-18 DIAGNOSIS — E039 Hypothyroidism, unspecified: Secondary | ICD-10-CM | POA: Diagnosis not present

## 2019-01-18 DIAGNOSIS — Z9181 History of falling: Secondary | ICD-10-CM | POA: Diagnosis not present

## 2019-01-18 DIAGNOSIS — Z4800 Encounter for change or removal of nonsurgical wound dressing: Secondary | ICD-10-CM | POA: Diagnosis not present

## 2019-01-18 DIAGNOSIS — G309 Alzheimer's disease, unspecified: Secondary | ICD-10-CM | POA: Diagnosis not present

## 2019-01-18 DIAGNOSIS — M810 Age-related osteoporosis without current pathological fracture: Secondary | ICD-10-CM | POA: Diagnosis not present

## 2019-01-18 DIAGNOSIS — L89150 Pressure ulcer of sacral region, unstageable: Secondary | ICD-10-CM | POA: Diagnosis not present

## 2019-01-19 DIAGNOSIS — L89150 Pressure ulcer of sacral region, unstageable: Secondary | ICD-10-CM | POA: Diagnosis not present

## 2019-01-19 DIAGNOSIS — G309 Alzheimer's disease, unspecified: Secondary | ICD-10-CM | POA: Diagnosis not present

## 2019-01-19 DIAGNOSIS — E039 Hypothyroidism, unspecified: Secondary | ICD-10-CM | POA: Diagnosis not present

## 2019-01-19 DIAGNOSIS — F419 Anxiety disorder, unspecified: Secondary | ICD-10-CM | POA: Diagnosis not present

## 2019-01-19 DIAGNOSIS — M810 Age-related osteoporosis without current pathological fracture: Secondary | ICD-10-CM | POA: Diagnosis not present

## 2019-01-19 DIAGNOSIS — Z4800 Encounter for change or removal of nonsurgical wound dressing: Secondary | ICD-10-CM | POA: Diagnosis not present

## 2019-01-19 DIAGNOSIS — Z9181 History of falling: Secondary | ICD-10-CM | POA: Diagnosis not present

## 2019-01-19 DIAGNOSIS — F0280 Dementia in other diseases classified elsewhere without behavioral disturbance: Secondary | ICD-10-CM | POA: Diagnosis not present

## 2019-01-21 DIAGNOSIS — Z9181 History of falling: Secondary | ICD-10-CM | POA: Diagnosis not present

## 2019-01-21 DIAGNOSIS — G309 Alzheimer's disease, unspecified: Secondary | ICD-10-CM | POA: Diagnosis not present

## 2019-01-21 DIAGNOSIS — F419 Anxiety disorder, unspecified: Secondary | ICD-10-CM | POA: Diagnosis not present

## 2019-01-21 DIAGNOSIS — E7800 Pure hypercholesterolemia, unspecified: Secondary | ICD-10-CM | POA: Diagnosis not present

## 2019-01-21 DIAGNOSIS — F0280 Dementia in other diseases classified elsewhere without behavioral disturbance: Secondary | ICD-10-CM | POA: Diagnosis not present

## 2019-01-21 DIAGNOSIS — M810 Age-related osteoporosis without current pathological fracture: Secondary | ICD-10-CM | POA: Diagnosis not present

## 2019-01-21 DIAGNOSIS — E039 Hypothyroidism, unspecified: Secondary | ICD-10-CM | POA: Diagnosis not present

## 2019-01-21 DIAGNOSIS — Z0000 Encounter for general adult medical examination without abnormal findings: Secondary | ICD-10-CM | POA: Diagnosis not present

## 2019-01-21 DIAGNOSIS — Z789 Other specified health status: Secondary | ICD-10-CM | POA: Diagnosis not present

## 2019-01-21 DIAGNOSIS — L89150 Pressure ulcer of sacral region, unstageable: Secondary | ICD-10-CM | POA: Diagnosis not present

## 2019-01-21 DIAGNOSIS — Z4800 Encounter for change or removal of nonsurgical wound dressing: Secondary | ICD-10-CM | POA: Diagnosis not present

## 2019-01-21 DIAGNOSIS — L8942 Pressure ulcer of contiguous site of back, buttock and hip, stage 2: Secondary | ICD-10-CM | POA: Diagnosis not present

## 2019-01-23 DIAGNOSIS — Z9181 History of falling: Secondary | ICD-10-CM | POA: Diagnosis not present

## 2019-01-23 DIAGNOSIS — M810 Age-related osteoporosis without current pathological fracture: Secondary | ICD-10-CM | POA: Diagnosis not present

## 2019-01-23 DIAGNOSIS — Z4800 Encounter for change or removal of nonsurgical wound dressing: Secondary | ICD-10-CM | POA: Diagnosis not present

## 2019-01-23 DIAGNOSIS — G309 Alzheimer's disease, unspecified: Secondary | ICD-10-CM | POA: Diagnosis not present

## 2019-01-23 DIAGNOSIS — F419 Anxiety disorder, unspecified: Secondary | ICD-10-CM | POA: Diagnosis not present

## 2019-01-23 DIAGNOSIS — E039 Hypothyroidism, unspecified: Secondary | ICD-10-CM | POA: Diagnosis not present

## 2019-01-23 DIAGNOSIS — L89150 Pressure ulcer of sacral region, unstageable: Secondary | ICD-10-CM | POA: Diagnosis not present

## 2019-01-23 DIAGNOSIS — F0280 Dementia in other diseases classified elsewhere without behavioral disturbance: Secondary | ICD-10-CM | POA: Diagnosis not present

## 2019-01-25 DIAGNOSIS — L89150 Pressure ulcer of sacral region, unstageable: Secondary | ICD-10-CM | POA: Diagnosis not present

## 2019-01-25 DIAGNOSIS — Z9181 History of falling: Secondary | ICD-10-CM | POA: Diagnosis not present

## 2019-01-25 DIAGNOSIS — F0280 Dementia in other diseases classified elsewhere without behavioral disturbance: Secondary | ICD-10-CM | POA: Diagnosis not present

## 2019-01-25 DIAGNOSIS — F419 Anxiety disorder, unspecified: Secondary | ICD-10-CM | POA: Diagnosis not present

## 2019-01-25 DIAGNOSIS — E039 Hypothyroidism, unspecified: Secondary | ICD-10-CM | POA: Diagnosis not present

## 2019-01-25 DIAGNOSIS — Z4800 Encounter for change or removal of nonsurgical wound dressing: Secondary | ICD-10-CM | POA: Diagnosis not present

## 2019-01-25 DIAGNOSIS — G309 Alzheimer's disease, unspecified: Secondary | ICD-10-CM | POA: Diagnosis not present

## 2019-01-25 DIAGNOSIS — M810 Age-related osteoporosis without current pathological fracture: Secondary | ICD-10-CM | POA: Diagnosis not present

## 2019-01-27 DIAGNOSIS — F0280 Dementia in other diseases classified elsewhere without behavioral disturbance: Secondary | ICD-10-CM | POA: Diagnosis not present

## 2019-01-27 DIAGNOSIS — L89150 Pressure ulcer of sacral region, unstageable: Secondary | ICD-10-CM | POA: Diagnosis not present

## 2019-01-27 DIAGNOSIS — F419 Anxiety disorder, unspecified: Secondary | ICD-10-CM | POA: Diagnosis not present

## 2019-01-27 DIAGNOSIS — G309 Alzheimer's disease, unspecified: Secondary | ICD-10-CM | POA: Diagnosis not present

## 2019-01-27 DIAGNOSIS — M810 Age-related osteoporosis without current pathological fracture: Secondary | ICD-10-CM | POA: Diagnosis not present

## 2019-01-27 DIAGNOSIS — Z9181 History of falling: Secondary | ICD-10-CM | POA: Diagnosis not present

## 2019-01-27 DIAGNOSIS — Z4800 Encounter for change or removal of nonsurgical wound dressing: Secondary | ICD-10-CM | POA: Diagnosis not present

## 2019-01-27 DIAGNOSIS — E039 Hypothyroidism, unspecified: Secondary | ICD-10-CM | POA: Diagnosis not present

## 2019-01-28 DIAGNOSIS — Z9181 History of falling: Secondary | ICD-10-CM | POA: Diagnosis not present

## 2019-01-28 DIAGNOSIS — G309 Alzheimer's disease, unspecified: Secondary | ICD-10-CM | POA: Diagnosis not present

## 2019-01-28 DIAGNOSIS — M810 Age-related osteoporosis without current pathological fracture: Secondary | ICD-10-CM | POA: Diagnosis not present

## 2019-01-28 DIAGNOSIS — Z4800 Encounter for change or removal of nonsurgical wound dressing: Secondary | ICD-10-CM | POA: Diagnosis not present

## 2019-01-28 DIAGNOSIS — E039 Hypothyroidism, unspecified: Secondary | ICD-10-CM | POA: Diagnosis not present

## 2019-01-28 DIAGNOSIS — F0280 Dementia in other diseases classified elsewhere without behavioral disturbance: Secondary | ICD-10-CM | POA: Diagnosis not present

## 2019-01-28 DIAGNOSIS — L89150 Pressure ulcer of sacral region, unstageable: Secondary | ICD-10-CM | POA: Diagnosis not present

## 2019-01-28 DIAGNOSIS — F419 Anxiety disorder, unspecified: Secondary | ICD-10-CM | POA: Diagnosis not present

## 2019-01-29 DIAGNOSIS — L89150 Pressure ulcer of sacral region, unstageable: Secondary | ICD-10-CM | POA: Diagnosis not present

## 2019-01-29 DIAGNOSIS — F0280 Dementia in other diseases classified elsewhere without behavioral disturbance: Secondary | ICD-10-CM | POA: Diagnosis not present

## 2019-01-29 DIAGNOSIS — G309 Alzheimer's disease, unspecified: Secondary | ICD-10-CM | POA: Diagnosis not present

## 2019-01-29 DIAGNOSIS — F419 Anxiety disorder, unspecified: Secondary | ICD-10-CM | POA: Diagnosis not present

## 2019-01-29 DIAGNOSIS — M810 Age-related osteoporosis without current pathological fracture: Secondary | ICD-10-CM | POA: Diagnosis not present

## 2019-01-29 DIAGNOSIS — E039 Hypothyroidism, unspecified: Secondary | ICD-10-CM | POA: Diagnosis not present

## 2019-01-29 DIAGNOSIS — Z9181 History of falling: Secondary | ICD-10-CM | POA: Diagnosis not present

## 2019-01-29 DIAGNOSIS — Z4800 Encounter for change or removal of nonsurgical wound dressing: Secondary | ICD-10-CM | POA: Diagnosis not present

## 2019-01-31 DIAGNOSIS — F0280 Dementia in other diseases classified elsewhere without behavioral disturbance: Secondary | ICD-10-CM | POA: Diagnosis not present

## 2019-01-31 DIAGNOSIS — F419 Anxiety disorder, unspecified: Secondary | ICD-10-CM | POA: Diagnosis not present

## 2019-01-31 DIAGNOSIS — Z4800 Encounter for change or removal of nonsurgical wound dressing: Secondary | ICD-10-CM | POA: Diagnosis not present

## 2019-01-31 DIAGNOSIS — E039 Hypothyroidism, unspecified: Secondary | ICD-10-CM | POA: Diagnosis not present

## 2019-01-31 DIAGNOSIS — G309 Alzheimer's disease, unspecified: Secondary | ICD-10-CM | POA: Diagnosis not present

## 2019-01-31 DIAGNOSIS — Z9181 History of falling: Secondary | ICD-10-CM | POA: Diagnosis not present

## 2019-01-31 DIAGNOSIS — L89150 Pressure ulcer of sacral region, unstageable: Secondary | ICD-10-CM | POA: Diagnosis not present

## 2019-01-31 DIAGNOSIS — M810 Age-related osteoporosis without current pathological fracture: Secondary | ICD-10-CM | POA: Diagnosis not present

## 2019-02-02 DIAGNOSIS — E039 Hypothyroidism, unspecified: Secondary | ICD-10-CM | POA: Diagnosis not present

## 2019-02-02 DIAGNOSIS — Z9181 History of falling: Secondary | ICD-10-CM | POA: Diagnosis not present

## 2019-02-02 DIAGNOSIS — M810 Age-related osteoporosis without current pathological fracture: Secondary | ICD-10-CM | POA: Diagnosis not present

## 2019-02-02 DIAGNOSIS — G309 Alzheimer's disease, unspecified: Secondary | ICD-10-CM | POA: Diagnosis not present

## 2019-02-02 DIAGNOSIS — L89150 Pressure ulcer of sacral region, unstageable: Secondary | ICD-10-CM | POA: Diagnosis not present

## 2019-02-02 DIAGNOSIS — F0280 Dementia in other diseases classified elsewhere without behavioral disturbance: Secondary | ICD-10-CM | POA: Diagnosis not present

## 2019-02-02 DIAGNOSIS — Z4800 Encounter for change or removal of nonsurgical wound dressing: Secondary | ICD-10-CM | POA: Diagnosis not present

## 2019-02-02 DIAGNOSIS — F419 Anxiety disorder, unspecified: Secondary | ICD-10-CM | POA: Diagnosis not present

## 2019-02-04 DIAGNOSIS — L89150 Pressure ulcer of sacral region, unstageable: Secondary | ICD-10-CM | POA: Diagnosis not present

## 2019-02-04 DIAGNOSIS — F0280 Dementia in other diseases classified elsewhere without behavioral disturbance: Secondary | ICD-10-CM | POA: Diagnosis not present

## 2019-02-04 DIAGNOSIS — Z9181 History of falling: Secondary | ICD-10-CM | POA: Diagnosis not present

## 2019-02-04 DIAGNOSIS — Z4800 Encounter for change or removal of nonsurgical wound dressing: Secondary | ICD-10-CM | POA: Diagnosis not present

## 2019-02-04 DIAGNOSIS — E039 Hypothyroidism, unspecified: Secondary | ICD-10-CM | POA: Diagnosis not present

## 2019-02-04 DIAGNOSIS — F419 Anxiety disorder, unspecified: Secondary | ICD-10-CM | POA: Diagnosis not present

## 2019-02-04 DIAGNOSIS — M810 Age-related osteoporosis without current pathological fracture: Secondary | ICD-10-CM | POA: Diagnosis not present

## 2019-02-04 DIAGNOSIS — G309 Alzheimer's disease, unspecified: Secondary | ICD-10-CM | POA: Diagnosis not present

## 2019-02-06 DIAGNOSIS — Z9181 History of falling: Secondary | ICD-10-CM | POA: Diagnosis not present

## 2019-02-06 DIAGNOSIS — E039 Hypothyroidism, unspecified: Secondary | ICD-10-CM | POA: Diagnosis not present

## 2019-02-06 DIAGNOSIS — L89153 Pressure ulcer of sacral region, stage 3: Secondary | ICD-10-CM | POA: Diagnosis not present

## 2019-02-06 DIAGNOSIS — Z4800 Encounter for change or removal of nonsurgical wound dressing: Secondary | ICD-10-CM | POA: Diagnosis not present

## 2019-02-06 DIAGNOSIS — F0280 Dementia in other diseases classified elsewhere without behavioral disturbance: Secondary | ICD-10-CM | POA: Diagnosis not present

## 2019-02-06 DIAGNOSIS — M810 Age-related osteoporosis without current pathological fracture: Secondary | ICD-10-CM | POA: Diagnosis not present

## 2019-02-06 DIAGNOSIS — G309 Alzheimer's disease, unspecified: Secondary | ICD-10-CM | POA: Diagnosis not present

## 2019-02-06 DIAGNOSIS — F419 Anxiety disorder, unspecified: Secondary | ICD-10-CM | POA: Diagnosis not present

## 2019-02-07 DIAGNOSIS — L89153 Pressure ulcer of sacral region, stage 3: Secondary | ICD-10-CM | POA: Diagnosis not present

## 2019-02-07 DIAGNOSIS — F419 Anxiety disorder, unspecified: Secondary | ICD-10-CM | POA: Diagnosis not present

## 2019-02-07 DIAGNOSIS — Z4800 Encounter for change or removal of nonsurgical wound dressing: Secondary | ICD-10-CM | POA: Diagnosis not present

## 2019-02-07 DIAGNOSIS — F0280 Dementia in other diseases classified elsewhere without behavioral disturbance: Secondary | ICD-10-CM | POA: Diagnosis not present

## 2019-02-07 DIAGNOSIS — E039 Hypothyroidism, unspecified: Secondary | ICD-10-CM | POA: Diagnosis not present

## 2019-02-07 DIAGNOSIS — G309 Alzheimer's disease, unspecified: Secondary | ICD-10-CM | POA: Diagnosis not present

## 2019-02-07 DIAGNOSIS — Z9181 History of falling: Secondary | ICD-10-CM | POA: Diagnosis not present

## 2019-02-07 DIAGNOSIS — M810 Age-related osteoporosis without current pathological fracture: Secondary | ICD-10-CM | POA: Diagnosis not present

## 2019-02-08 DIAGNOSIS — E039 Hypothyroidism, unspecified: Secondary | ICD-10-CM | POA: Diagnosis not present

## 2019-02-08 DIAGNOSIS — L89153 Pressure ulcer of sacral region, stage 3: Secondary | ICD-10-CM | POA: Diagnosis not present

## 2019-02-08 DIAGNOSIS — M810 Age-related osteoporosis without current pathological fracture: Secondary | ICD-10-CM | POA: Diagnosis not present

## 2019-02-08 DIAGNOSIS — F419 Anxiety disorder, unspecified: Secondary | ICD-10-CM | POA: Diagnosis not present

## 2019-02-08 DIAGNOSIS — Z4800 Encounter for change or removal of nonsurgical wound dressing: Secondary | ICD-10-CM | POA: Diagnosis not present

## 2019-02-08 DIAGNOSIS — Z9181 History of falling: Secondary | ICD-10-CM | POA: Diagnosis not present

## 2019-02-08 DIAGNOSIS — F0280 Dementia in other diseases classified elsewhere without behavioral disturbance: Secondary | ICD-10-CM | POA: Diagnosis not present

## 2019-02-08 DIAGNOSIS — G309 Alzheimer's disease, unspecified: Secondary | ICD-10-CM | POA: Diagnosis not present

## 2019-02-10 DIAGNOSIS — F0280 Dementia in other diseases classified elsewhere without behavioral disturbance: Secondary | ICD-10-CM | POA: Diagnosis not present

## 2019-02-10 DIAGNOSIS — G309 Alzheimer's disease, unspecified: Secondary | ICD-10-CM | POA: Diagnosis not present

## 2019-02-10 DIAGNOSIS — E039 Hypothyroidism, unspecified: Secondary | ICD-10-CM | POA: Diagnosis not present

## 2019-02-10 DIAGNOSIS — L89153 Pressure ulcer of sacral region, stage 3: Secondary | ICD-10-CM | POA: Diagnosis not present

## 2019-02-10 DIAGNOSIS — F419 Anxiety disorder, unspecified: Secondary | ICD-10-CM | POA: Diagnosis not present

## 2019-02-10 DIAGNOSIS — Z9181 History of falling: Secondary | ICD-10-CM | POA: Diagnosis not present

## 2019-02-10 DIAGNOSIS — M810 Age-related osteoporosis without current pathological fracture: Secondary | ICD-10-CM | POA: Diagnosis not present

## 2019-02-10 DIAGNOSIS — Z4800 Encounter for change or removal of nonsurgical wound dressing: Secondary | ICD-10-CM | POA: Diagnosis not present

## 2019-02-11 DIAGNOSIS — F419 Anxiety disorder, unspecified: Secondary | ICD-10-CM | POA: Diagnosis not present

## 2019-02-11 DIAGNOSIS — Z9181 History of falling: Secondary | ICD-10-CM | POA: Diagnosis not present

## 2019-02-11 DIAGNOSIS — G309 Alzheimer's disease, unspecified: Secondary | ICD-10-CM | POA: Diagnosis not present

## 2019-02-11 DIAGNOSIS — L89153 Pressure ulcer of sacral region, stage 3: Secondary | ICD-10-CM | POA: Diagnosis not present

## 2019-02-11 DIAGNOSIS — M810 Age-related osteoporosis without current pathological fracture: Secondary | ICD-10-CM | POA: Diagnosis not present

## 2019-02-11 DIAGNOSIS — E039 Hypothyroidism, unspecified: Secondary | ICD-10-CM | POA: Diagnosis not present

## 2019-02-11 DIAGNOSIS — Z4800 Encounter for change or removal of nonsurgical wound dressing: Secondary | ICD-10-CM | POA: Diagnosis not present

## 2019-02-11 DIAGNOSIS — F0280 Dementia in other diseases classified elsewhere without behavioral disturbance: Secondary | ICD-10-CM | POA: Diagnosis not present

## 2019-02-12 DIAGNOSIS — F0280 Dementia in other diseases classified elsewhere without behavioral disturbance: Secondary | ICD-10-CM | POA: Diagnosis not present

## 2019-02-12 DIAGNOSIS — L89153 Pressure ulcer of sacral region, stage 3: Secondary | ICD-10-CM | POA: Diagnosis not present

## 2019-02-12 DIAGNOSIS — E039 Hypothyroidism, unspecified: Secondary | ICD-10-CM | POA: Diagnosis not present

## 2019-02-12 DIAGNOSIS — M810 Age-related osteoporosis without current pathological fracture: Secondary | ICD-10-CM | POA: Diagnosis not present

## 2019-02-12 DIAGNOSIS — F419 Anxiety disorder, unspecified: Secondary | ICD-10-CM | POA: Diagnosis not present

## 2019-02-12 DIAGNOSIS — Z9181 History of falling: Secondary | ICD-10-CM | POA: Diagnosis not present

## 2019-02-12 DIAGNOSIS — G309 Alzheimer's disease, unspecified: Secondary | ICD-10-CM | POA: Diagnosis not present

## 2019-02-12 DIAGNOSIS — Z4800 Encounter for change or removal of nonsurgical wound dressing: Secondary | ICD-10-CM | POA: Diagnosis not present

## 2019-02-13 DIAGNOSIS — G309 Alzheimer's disease, unspecified: Secondary | ICD-10-CM | POA: Diagnosis not present

## 2019-02-13 DIAGNOSIS — Z9181 History of falling: Secondary | ICD-10-CM | POA: Diagnosis not present

## 2019-02-13 DIAGNOSIS — L89153 Pressure ulcer of sacral region, stage 3: Secondary | ICD-10-CM | POA: Diagnosis not present

## 2019-02-13 DIAGNOSIS — F0280 Dementia in other diseases classified elsewhere without behavioral disturbance: Secondary | ICD-10-CM | POA: Diagnosis not present

## 2019-02-13 DIAGNOSIS — M810 Age-related osteoporosis without current pathological fracture: Secondary | ICD-10-CM | POA: Diagnosis not present

## 2019-02-13 DIAGNOSIS — Z4800 Encounter for change or removal of nonsurgical wound dressing: Secondary | ICD-10-CM | POA: Diagnosis not present

## 2019-02-13 DIAGNOSIS — F419 Anxiety disorder, unspecified: Secondary | ICD-10-CM | POA: Diagnosis not present

## 2019-02-13 DIAGNOSIS — E039 Hypothyroidism, unspecified: Secondary | ICD-10-CM | POA: Diagnosis not present

## 2019-02-14 DIAGNOSIS — L89153 Pressure ulcer of sacral region, stage 3: Secondary | ICD-10-CM | POA: Diagnosis not present

## 2019-02-14 DIAGNOSIS — Z4800 Encounter for change or removal of nonsurgical wound dressing: Secondary | ICD-10-CM | POA: Diagnosis not present

## 2019-02-14 DIAGNOSIS — E039 Hypothyroidism, unspecified: Secondary | ICD-10-CM | POA: Diagnosis not present

## 2019-02-14 DIAGNOSIS — M810 Age-related osteoporosis without current pathological fracture: Secondary | ICD-10-CM | POA: Diagnosis not present

## 2019-02-14 DIAGNOSIS — F0280 Dementia in other diseases classified elsewhere without behavioral disturbance: Secondary | ICD-10-CM | POA: Diagnosis not present

## 2019-02-14 DIAGNOSIS — F419 Anxiety disorder, unspecified: Secondary | ICD-10-CM | POA: Diagnosis not present

## 2019-02-14 DIAGNOSIS — Z9181 History of falling: Secondary | ICD-10-CM | POA: Diagnosis not present

## 2019-02-14 DIAGNOSIS — G309 Alzheimer's disease, unspecified: Secondary | ICD-10-CM | POA: Diagnosis not present

## 2019-02-16 DIAGNOSIS — L89153 Pressure ulcer of sacral region, stage 3: Secondary | ICD-10-CM | POA: Diagnosis not present

## 2019-02-16 DIAGNOSIS — Z4800 Encounter for change or removal of nonsurgical wound dressing: Secondary | ICD-10-CM | POA: Diagnosis not present

## 2019-02-16 DIAGNOSIS — M810 Age-related osteoporosis without current pathological fracture: Secondary | ICD-10-CM | POA: Diagnosis not present

## 2019-02-16 DIAGNOSIS — G309 Alzheimer's disease, unspecified: Secondary | ICD-10-CM | POA: Diagnosis not present

## 2019-02-16 DIAGNOSIS — F0280 Dementia in other diseases classified elsewhere without behavioral disturbance: Secondary | ICD-10-CM | POA: Diagnosis not present

## 2019-02-16 DIAGNOSIS — F419 Anxiety disorder, unspecified: Secondary | ICD-10-CM | POA: Diagnosis not present

## 2019-02-16 DIAGNOSIS — Z9181 History of falling: Secondary | ICD-10-CM | POA: Diagnosis not present

## 2019-02-16 DIAGNOSIS — E039 Hypothyroidism, unspecified: Secondary | ICD-10-CM | POA: Diagnosis not present

## 2019-02-18 DIAGNOSIS — F0280 Dementia in other diseases classified elsewhere without behavioral disturbance: Secondary | ICD-10-CM | POA: Diagnosis not present

## 2019-02-18 DIAGNOSIS — E039 Hypothyroidism, unspecified: Secondary | ICD-10-CM | POA: Diagnosis not present

## 2019-02-18 DIAGNOSIS — M810 Age-related osteoporosis without current pathological fracture: Secondary | ICD-10-CM | POA: Diagnosis not present

## 2019-02-18 DIAGNOSIS — G309 Alzheimer's disease, unspecified: Secondary | ICD-10-CM | POA: Diagnosis not present

## 2019-02-18 DIAGNOSIS — F419 Anxiety disorder, unspecified: Secondary | ICD-10-CM | POA: Diagnosis not present

## 2019-02-18 DIAGNOSIS — L89153 Pressure ulcer of sacral region, stage 3: Secondary | ICD-10-CM | POA: Diagnosis not present

## 2019-02-18 DIAGNOSIS — Z9181 History of falling: Secondary | ICD-10-CM | POA: Diagnosis not present

## 2019-02-18 DIAGNOSIS — Z4800 Encounter for change or removal of nonsurgical wound dressing: Secondary | ICD-10-CM | POA: Diagnosis not present

## 2019-02-19 ENCOUNTER — Telehealth: Payer: Self-pay | Admitting: Internal Medicine

## 2019-02-19 DIAGNOSIS — Z9181 History of falling: Secondary | ICD-10-CM | POA: Diagnosis not present

## 2019-02-19 DIAGNOSIS — F419 Anxiety disorder, unspecified: Secondary | ICD-10-CM | POA: Diagnosis not present

## 2019-02-19 DIAGNOSIS — L89153 Pressure ulcer of sacral region, stage 3: Secondary | ICD-10-CM | POA: Diagnosis not present

## 2019-02-19 DIAGNOSIS — Z4800 Encounter for change or removal of nonsurgical wound dressing: Secondary | ICD-10-CM | POA: Diagnosis not present

## 2019-02-19 DIAGNOSIS — G309 Alzheimer's disease, unspecified: Secondary | ICD-10-CM | POA: Diagnosis not present

## 2019-02-19 DIAGNOSIS — F0280 Dementia in other diseases classified elsewhere without behavioral disturbance: Secondary | ICD-10-CM | POA: Diagnosis not present

## 2019-02-19 DIAGNOSIS — M810 Age-related osteoporosis without current pathological fracture: Secondary | ICD-10-CM | POA: Diagnosis not present

## 2019-02-19 DIAGNOSIS — E039 Hypothyroidism, unspecified: Secondary | ICD-10-CM | POA: Diagnosis not present

## 2019-02-19 NOTE — Telephone Encounter (Signed)
New message:    Patient care giver would like for some one to call her concerning a device check concern.

## 2019-02-20 DIAGNOSIS — F0280 Dementia in other diseases classified elsewhere without behavioral disturbance: Secondary | ICD-10-CM | POA: Diagnosis not present

## 2019-02-20 DIAGNOSIS — E039 Hypothyroidism, unspecified: Secondary | ICD-10-CM | POA: Diagnosis not present

## 2019-02-20 DIAGNOSIS — L89153 Pressure ulcer of sacral region, stage 3: Secondary | ICD-10-CM | POA: Diagnosis not present

## 2019-02-20 DIAGNOSIS — Z4800 Encounter for change or removal of nonsurgical wound dressing: Secondary | ICD-10-CM | POA: Diagnosis not present

## 2019-02-20 DIAGNOSIS — M810 Age-related osteoporosis without current pathological fracture: Secondary | ICD-10-CM | POA: Diagnosis not present

## 2019-02-20 DIAGNOSIS — F419 Anxiety disorder, unspecified: Secondary | ICD-10-CM | POA: Diagnosis not present

## 2019-02-20 DIAGNOSIS — Z9181 History of falling: Secondary | ICD-10-CM | POA: Diagnosis not present

## 2019-02-20 DIAGNOSIS — G309 Alzheimer's disease, unspecified: Secondary | ICD-10-CM | POA: Diagnosis not present

## 2019-02-22 DIAGNOSIS — G309 Alzheimer's disease, unspecified: Secondary | ICD-10-CM | POA: Diagnosis not present

## 2019-02-22 DIAGNOSIS — M810 Age-related osteoporosis without current pathological fracture: Secondary | ICD-10-CM | POA: Diagnosis not present

## 2019-02-22 DIAGNOSIS — F419 Anxiety disorder, unspecified: Secondary | ICD-10-CM | POA: Diagnosis not present

## 2019-02-22 DIAGNOSIS — L89153 Pressure ulcer of sacral region, stage 3: Secondary | ICD-10-CM | POA: Diagnosis not present

## 2019-02-22 DIAGNOSIS — Z9181 History of falling: Secondary | ICD-10-CM | POA: Diagnosis not present

## 2019-02-22 DIAGNOSIS — F0280 Dementia in other diseases classified elsewhere without behavioral disturbance: Secondary | ICD-10-CM | POA: Diagnosis not present

## 2019-02-22 DIAGNOSIS — Z4800 Encounter for change or removal of nonsurgical wound dressing: Secondary | ICD-10-CM | POA: Diagnosis not present

## 2019-02-22 DIAGNOSIS — E039 Hypothyroidism, unspecified: Secondary | ICD-10-CM | POA: Diagnosis not present

## 2019-02-23 ENCOUNTER — Emergency Department (HOSPITAL_BASED_OUTPATIENT_CLINIC_OR_DEPARTMENT_OTHER)
Admission: EM | Admit: 2019-02-23 | Discharge: 2019-02-23 | Disposition: A | Discharge status: Home / Self Care | Payer: Medicare HMO | Attending: Emergency Medicine | Admitting: Emergency Medicine

## 2019-02-23 ENCOUNTER — Emergency Department (HOSPITAL_BASED_OUTPATIENT_CLINIC_OR_DEPARTMENT_OTHER): Payer: Medicare HMO

## 2019-02-23 ENCOUNTER — Encounter (HOSPITAL_BASED_OUTPATIENT_CLINIC_OR_DEPARTMENT_OTHER): Payer: Self-pay

## 2019-02-23 ENCOUNTER — Other Ambulatory Visit: Payer: Self-pay

## 2019-02-23 DIAGNOSIS — I110 Hypertensive heart disease with heart failure: Secondary | ICD-10-CM | POA: Diagnosis not present

## 2019-02-23 DIAGNOSIS — W19XXXA Unspecified fall, initial encounter: Secondary | ICD-10-CM | POA: Diagnosis not present

## 2019-02-23 DIAGNOSIS — S0101XA Laceration without foreign body of scalp, initial encounter: Secondary | ICD-10-CM | POA: Diagnosis not present

## 2019-02-23 DIAGNOSIS — R402411 Glasgow coma scale score 13-15, in the field [EMT or ambulance]: Secondary | ICD-10-CM | POA: Diagnosis not present

## 2019-02-23 DIAGNOSIS — E039 Hypothyroidism, unspecified: Secondary | ICD-10-CM | POA: Diagnosis not present

## 2019-02-23 DIAGNOSIS — S0990XA Unspecified injury of head, initial encounter: Secondary | ICD-10-CM | POA: Diagnosis not present

## 2019-02-23 DIAGNOSIS — W01198A Fall on same level from slipping, tripping and stumbling with subsequent striking against other object, initial encounter: Secondary | ICD-10-CM | POA: Diagnosis not present

## 2019-02-23 DIAGNOSIS — Z79899 Other long term (current) drug therapy: Secondary | ICD-10-CM | POA: Diagnosis not present

## 2019-02-23 DIAGNOSIS — Y999 Unspecified external cause status: Secondary | ICD-10-CM | POA: Insufficient documentation

## 2019-02-23 DIAGNOSIS — Z950 Presence of cardiac pacemaker: Secondary | ICD-10-CM | POA: Diagnosis not present

## 2019-02-23 DIAGNOSIS — I5032 Chronic diastolic (congestive) heart failure: Secondary | ICD-10-CM | POA: Insufficient documentation

## 2019-02-23 DIAGNOSIS — Y92128 Other place in nursing home as the place of occurrence of the external cause: Secondary | ICD-10-CM | POA: Diagnosis not present

## 2019-02-23 DIAGNOSIS — Z87891 Personal history of nicotine dependence: Secondary | ICD-10-CM | POA: Insufficient documentation

## 2019-02-23 DIAGNOSIS — R0902 Hypoxemia: Secondary | ICD-10-CM | POA: Diagnosis not present

## 2019-02-23 DIAGNOSIS — M2550 Pain in unspecified joint: Secondary | ICD-10-CM | POA: Diagnosis not present

## 2019-02-23 DIAGNOSIS — Y9389 Activity, other specified: Secondary | ICD-10-CM | POA: Diagnosis not present

## 2019-02-23 DIAGNOSIS — Z7982 Long term (current) use of aspirin: Secondary | ICD-10-CM | POA: Insufficient documentation

## 2019-02-23 DIAGNOSIS — R51 Headache: Secondary | ICD-10-CM | POA: Diagnosis not present

## 2019-02-23 DIAGNOSIS — Z7401 Bed confinement status: Secondary | ICD-10-CM | POA: Diagnosis not present

## 2019-02-23 MED ORDER — LIDOCAINE-EPINEPHRINE (PF) 2 %-1:200000 IJ SOLN
INTRAMUSCULAR | Status: AC
  Administered 2019-02-23: 10:00:00
  Filled 2019-02-23: qty 10

## 2019-02-23 MED ORDER — LIDOCAINE-EPINEPHRINE 2 %-1:100000 IJ SOLN
10.0000 mL | Freq: Once | INTRAMUSCULAR | Status: AC
  Administered 2019-02-23: 10 mL via INTRADERMAL
  Filled 2019-02-23: qty 10.2

## 2019-02-23 NOTE — ED Notes (Signed)
[facility] called via [facility] dispatch @ 1028

## 2019-02-23 NOTE — ED Provider Notes (Signed)
[facility] DEPARTMENT Provider Note   CSN: [id number] Arrival date & time: 02/23/19  5852    History   Chief Complaint Chief Complaint  Patient presents with  . Fall  . Head Laceration    HPI Erin [name] is a 85 y.o. female.     HPI Patient presented to the emergency room for evaluation of a head injury.  Patient resides at a nursing facility.  She was observed walking with her walker this morning where she got caught on the carpet and tripped and fell falling backwards striking the back of her head on the floor.  Patient sustained a small laceration.  She was brought to the ED for evaluation.  Patient takes an aspirin but is not on any other anticoagulants.  She did not lose consciousness.  Bleeding stopped prior to arrival.  Patient denies any other injuries.  She denies any headache, chest pain or shortness of breath.  No pain in her extremities. Past Medical History:  Diagnosis Date  . Anxiety   . BBB (bundle branch block)   . Breast cancer ([place])   . Carotid artery plaque   . Diastolic CHF ([place])   . DVT (deep venous thrombosis) ([place])   . H/O: hysterectomy   . HTN (hypertension)   . Hyperlipidemia   . Hypothyroidism   . Memory loss   . Stroke [facility])     Patient Active Problem List   Diagnosis Date Noted  . 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 Pseudophakia of both eyes 05/27/2015  . Dyslipidemia 11/18/2014  . Mild cognitive disorder 09/02/2014  . Memory loss 09/02/2014  . Hx of completed stroke 08/29/2013  . Small vessel disease ([place]) 08/29/2013  . Atrioventricular block, complete ([place]) 01/31/2012  . Hypertension 01/25/2011  . Pacemaker 01/25/2011  . Resolved stroke 01/25/2011    Past Surgical History:  Procedure Laterality Date  . ABDOMINAL HYSTERECTOMY    . BREAST SURGERY    . CARDIAC CATHETERIZATION  2007  . CAROTID ENDARTERECTOMY    . CARPAL TUNNEL RELEASE    . cataracts    . MASTECTOMY    . PACEMAKER GENERATOR CHANGE   08/08/2014   MDT [name] dual chamber pacemaker generator change by Dr Lovena Le  . PACEMAKER INSERTION    . PERMANENT PACEMAKER GENERATOR CHANGE N/A 08/07/2014   Procedure: PERMANENT PACEMAKER GENERATOR CHANGE;  Surgeon: Evans Lance, MD;  Location: [facility] CATH LAB;  Service: Cardiovascular;  Laterality: N/A;     OB History   No obstetric history on file.      Home Medications    Prior to Admission medications   Medication Sig Start Date End Date Taking? Authorizing Provider  acetaminophen (TYLENOL) 325 MG tablet Take 1 tablet (325 mg total) by mouth every 6 (six) hours as needed. 03/28/18   Venancio Poisson, NP  alendronate (FOSAMAX) 70 MG tablet  01/08/18   [provider]  aspirin 81 MG tablet Take 1 tablet (81 mg total) by mouth daily. 03/28/18   Venancio Poisson, NP  Calcium Carb-Cholecalciferol (CALCIUM 1000 + D PO) Take 1,000 Units by mouth daily.     [provider]  calcium carbonate (TUMS EX) 750 MG chewable tablet Chew 2 tablets by mouth daily.     [provider]  chlordiazePOXIDE (LIBRIUM) 5 MG capsule TAKE ONE CAPSULE BY MOUTH IN THE MORNING AND TAKE 2 CAPSULES BY MOUTH IN THE EVENING 11/09/18   Venancio Poisson, NP  CINNAMON PO Take 1,000  mg by mouth 2 (two) times daily.    [provider]  Coenzyme Q10 (COQ-10) 400 MG CAPS Take 400 tablets by mouth daily.     [provider]  CRANBERRY FRUIT PO Take 2,000 mg by mouth 2 (two) times daily.     [provider]  Fish Oil-Cholecalciferol (FISH OIL + D3 PO) Take 1 capsule by mouth 2 (two) times daily.     [provider]  furosemide (LASIX) 20 MG tablet Take one tablet daily.  May take one extra tablet daily as needed for shortness of breath. 12/29/17   Evans Lance, MD  Eloise Levels, Camillia sinensis, ([employer] GREEN TEA EXTRACT PO) Take 2 tablets by mouth 2 (two) times daily.     [provider]  levothyroxine (SYNTHROID, LEVOTHROID) 50 MCG tablet Take 50 mcg by  mouth daily before breakfast. Takes every other day.  Alternates with the other.    [provider]  levothyroxine (SYNTHROID, LEVOTHROID) 75 MCG tablet Take 75 mcg by mouth daily before breakfast.    [provider]  Misc Natural Products (OSTEO BI-FLEX ADV JOINT SHIELD PO) Take 1 tablet by mouth daily.    [provider]  simvastatin (ZOCOR) 20 MG tablet TAKE 1 TABLET (20 MG TOTAL) BY MOUTH DAILY. 11/24/15   Evans Lance, MD    Family History Family History  Problem Relation Age of Onset  . Unexplained death Mother 73  . Cancer Mother   . Hyperlipidemia Mother   . Stroke Mother   . Unexplained death Father 39    Social History Social History   Tobacco Use  . Smoking status: Former Smoker    Packs/day: 0.75    Types: Cigarettes  . Smokeless tobacco: Never Used  Substance Use Topics  . Alcohol use: No    Alcohol/week: 0.0 standard drinks  . Drug use: No     Allergies   Lisinopril; Tetracyclines & related; and Toprol xl [metoprolol succinate]   Review of Systems Review of Systems  All other systems reviewed and are negative.    Physical Exam Updated Vital Signs BP 131/70 (BP Location: Right Arm)   Pulse 81   Temp 97.7 F (36.5 C) (Oral)   Resp 16   Ht 1.575 m (5\' 2" )   Wt 45 kg   SpO2 94%   BMI 18.15 kg/m   Physical Exam Vitals signs and nursing note reviewed.  Constitutional:      General: She is not in acute distress.    Appearance: She is well-developed.  HENT:     Head: Normocephalic.     Comments: Small laceration posterior occiput, dried blood with no active bleeding    Right Ear: External ear normal.     Left Ear: External ear normal.  Eyes:     General: No scleral icterus.       Right eye: No discharge.        Left eye: No discharge.     Conjunctiva/sclera: Conjunctivae normal.  Neck:     Musculoskeletal: Neck supple.     Trachea: No tracheal deviation.  Cardiovascular:     Rate and Rhythm: Normal rate and  regular rhythm.  Pulmonary:     Effort: Pulmonary effort is normal. No respiratory distress.     Breath sounds: Normal breath sounds. No stridor. No wheezing or rales.  Abdominal:     General: Bowel sounds are normal. There is no distension.     Palpations: Abdomen is soft.  Tenderness: There is no abdominal tenderness. There is no guarding or rebound.  Musculoskeletal:        General: No tenderness.     Right shoulder: She exhibits no tenderness, no bony tenderness and no swelling.     Left shoulder: She exhibits no tenderness, no bony tenderness and no swelling.     Right wrist: She exhibits no tenderness, no bony tenderness and no swelling.     Left wrist: She exhibits no tenderness, no bony tenderness and no swelling.     Right hip: She exhibits normal range of motion, no tenderness, no bony tenderness and no swelling.     Left hip: She exhibits normal range of motion, no tenderness and no bony tenderness.     Right ankle: She exhibits no swelling. No tenderness.     Left ankle: She exhibits no swelling. No tenderness.     Cervical back: She exhibits no tenderness, no bony tenderness and no swelling.     Thoracic back: She exhibits no tenderness, no bony tenderness and no swelling.     Lumbar back: She exhibits no tenderness, no bony tenderness and no swelling.  Skin:    General: Skin is warm and dry.     Findings: No rash.  Neurological:     Mental Status: She is alert.     Cranial Nerves: No cranial nerve deficit (no facial droop, extraocular movements intact, no slurred speech).     Sensory: No sensory deficit.     Motor: No abnormal muscle tone or seizure activity.     Coordination: Coordination normal.      ED Treatments / Results  Labs (all labs ordered are listed, but only abnormal results are displayed) Labs Reviewed - No data to display  EKG None  Radiology Ct Head Wo Contrast  Result Date: 02/23/2019 CLINICAL DATA:  Fall. EXAM: CT HEAD WITHOUT CONTRAST  TECHNIQUE: Contiguous axial images were obtained from the base of the skull through the vertex without intravenous contrast. COMPARISON:  01/16/2018 FINDINGS: Brain: There is no evidence of acute infarct, intracranial hemorrhage, mass, midline shift, or extra-axial fluid collection. Patchy to confluent hypodensities in the cerebral white matter are unchanged and nonspecific but compatible with moderate to severe chronic small vessel ischemic disease. A chronic infarct in the right corona radiata is unchanged. There is moderate cerebral atrophy with prominent mesial temporal lobe atrophy greater on the right. Vascular: Calcified atherosclerosis at the skull base. No hyperdense vessel. Skull: No fracture or focal osseous lesion. Sinuses/Orbits: Bilateral cataract extraction. Minimal left sphenoid sinus mucosal thickening. Clear mastoid air cells. Other: None. IMPRESSION: 1. No evidence of acute intracranial abnormality. 2. Moderate to severe chronic small vessel ischemic disease. Electronically Signed   By: Logan Bores M.D.   On: 02/23/2019 10:13    Procedures .Marland KitchenLaceration Repair Date/Time: 02/23/2019 10:16 AM Performed by: Dorie Rank, MD Authorized by: Dorie Rank, MD   Consent:    Consent obtained:  Verbal and emergent situation   Consent given by:  Patient   Risks discussed:  Infection, need for additional repair, pain, poor cosmetic result and poor wound healing   Alternatives discussed:  No treatment and delayed treatment Universal protocol:    Procedure explained and questions answered to patient or proxy's satisfaction: yes     Relevant documents present and verified: yes     Test results available and properly labeled: yes     Imaging studies available: yes     Required blood products, implants, devices, and  special equipment available: yes     Site/side marked: yes     Immediately prior to procedure, a time out was called: yes     Patient identity confirmed:  Verbally with patient  Anesthesia (see MAR for exact dosages):    Anesthesia method:  Local infiltration   Local anesthetic:  Lidocaine 1% WITH epi Laceration details:    Location:  Scalp   Scalp location:  Occipital   Length (cm):  1 Repair type:    Repair type:  Simple Exploration:    Wound exploration: entire depth of wound probed and visualized     Wound extent: no underlying fracture noted     Contaminated: no   Treatment:    Area cleansed with:  Saline and Betadine   Amount of cleaning:  Standard   Irrigation solution:  Sterile saline   Visualized foreign bodies/material removed: no   Skin repair:    Repair method:  Staples   Number of staples:  2 Approximation:    Approximation:  Close Post-procedure details:    Dressing:  Open (no dressing)   Patient tolerance of procedure:  Tolerated well, no immediate complications   (including critical care time)  Medications Ordered in ED Medications  lidocaine-EPINEPHrine (XYLOCAINE W/EPI) 2 %-1:100000 (with pres) injection 10 mL (10 mLs Intradermal Given 02/23/19 0938)  lidocaine-EPINEPHrine (XYLOCAINE W/EPI) 2 %-1:200000 (PF) injection (  Given by Other 02/23/19 0943)     Initial Impression / Assessment and Plan / ED Course  I have reviewed the triage vital signs and the nursing notes.  Pertinent labs & imaging results that were available during my care of the patient were reviewed by me and considered in my medical decision making (see chart for details).   Mechanical fall.  No systemic symptoms to suggest acute medical condition.  No focal tendenress other than the back of her head. CT scan without acute findings.  Laceration repaired without difficulty.  At this time there does not appear to be any evidence of an acute emergency medical condition and the patient appears stable for discharge with appropriate outpatient follow up.   Final Clinical Impressions(s) / ED Diagnoses   Final diagnoses:  Fall, initial encounter  Laceration of scalp,  initial encounter    ED Discharge Orders    None       Dorie Rank, MD 02/23/19 1020

## 2019-02-23 NOTE — Discharge Instructions (Addendum)
Removal in approximately 7 days, monitor for severe headache, vomiting, confusion or other concerning symptoms

## 2019-02-23 NOTE — ED Triage Notes (Signed)
Pt from [facility].  Staff reported witnessed fall 9am, pt was walking with walker when it caught on the carpet, pt fell backwards striking posterior head on floor.  1cm laceration posterior scalp.  Bleeding controlled on arrival, no LOC.  Pt takes aspirin daily.  Arrives with c-collar in place due to mechanism of injury and pt cognitive status.  Pt denies pain, moves all extremities at baseline. c-collar removed by MD on arrival following physical exam

## 2019-02-23 NOTE — ED Notes (Signed)
Report Called to [facility].  Pt ready for transport back to facility.

## 2019-02-23 NOTE — ED Notes (Signed)
Pt to CT

## 2019-02-25 DIAGNOSIS — E039 Hypothyroidism, unspecified: Secondary | ICD-10-CM | POA: Diagnosis not present

## 2019-02-25 DIAGNOSIS — Z9181 History of falling: Secondary | ICD-10-CM | POA: Diagnosis not present

## 2019-02-25 DIAGNOSIS — F419 Anxiety disorder, unspecified: Secondary | ICD-10-CM | POA: Diagnosis not present

## 2019-02-25 DIAGNOSIS — Z4800 Encounter for change or removal of nonsurgical wound dressing: Secondary | ICD-10-CM | POA: Diagnosis not present

## 2019-02-25 DIAGNOSIS — L8942 Pressure ulcer of contiguous site of back, buttock and hip, stage 2: Secondary | ICD-10-CM | POA: Diagnosis not present

## 2019-02-25 DIAGNOSIS — F0280 Dementia in other diseases classified elsewhere without behavioral disturbance: Secondary | ICD-10-CM | POA: Diagnosis not present

## 2019-02-25 DIAGNOSIS — G309 Alzheimer's disease, unspecified: Secondary | ICD-10-CM | POA: Diagnosis not present

## 2019-02-25 DIAGNOSIS — R509 Fever, unspecified: Secondary | ICD-10-CM | POA: Diagnosis not present

## 2019-02-25 DIAGNOSIS — L89153 Pressure ulcer of sacral region, stage 3: Secondary | ICD-10-CM | POA: Diagnosis not present

## 2019-02-25 DIAGNOSIS — M549 Dorsalgia, unspecified: Secondary | ICD-10-CM | POA: Diagnosis not present

## 2019-02-25 DIAGNOSIS — M810 Age-related osteoporosis without current pathological fracture: Secondary | ICD-10-CM | POA: Diagnosis not present

## 2019-02-25 DIAGNOSIS — L8990 Pressure ulcer of unspecified site, unspecified stage: Secondary | ICD-10-CM | POA: Diagnosis not present

## 2019-02-27 DIAGNOSIS — L89153 Pressure ulcer of sacral region, stage 3: Secondary | ICD-10-CM | POA: Diagnosis not present

## 2019-02-27 DIAGNOSIS — Z4800 Encounter for change or removal of nonsurgical wound dressing: Secondary | ICD-10-CM | POA: Diagnosis not present

## 2019-02-27 DIAGNOSIS — F419 Anxiety disorder, unspecified: Secondary | ICD-10-CM | POA: Diagnosis not present

## 2019-02-27 DIAGNOSIS — G309 Alzheimer's disease, unspecified: Secondary | ICD-10-CM | POA: Diagnosis not present

## 2019-02-27 DIAGNOSIS — Z9181 History of falling: Secondary | ICD-10-CM | POA: Diagnosis not present

## 2019-02-27 DIAGNOSIS — M810 Age-related osteoporosis without current pathological fracture: Secondary | ICD-10-CM | POA: Diagnosis not present

## 2019-02-27 DIAGNOSIS — F0280 Dementia in other diseases classified elsewhere without behavioral disturbance: Secondary | ICD-10-CM | POA: Diagnosis not present

## 2019-02-27 DIAGNOSIS — E039 Hypothyroidism, unspecified: Secondary | ICD-10-CM | POA: Diagnosis not present

## 2019-03-01 DIAGNOSIS — E039 Hypothyroidism, unspecified: Secondary | ICD-10-CM | POA: Diagnosis not present

## 2019-03-01 DIAGNOSIS — Z9181 History of falling: Secondary | ICD-10-CM | POA: Diagnosis not present

## 2019-03-01 DIAGNOSIS — G309 Alzheimer's disease, unspecified: Secondary | ICD-10-CM | POA: Diagnosis not present

## 2019-03-01 DIAGNOSIS — F419 Anxiety disorder, unspecified: Secondary | ICD-10-CM | POA: Diagnosis not present

## 2019-03-01 DIAGNOSIS — M810 Age-related osteoporosis without current pathological fracture: Secondary | ICD-10-CM | POA: Diagnosis not present

## 2019-03-01 DIAGNOSIS — Z4800 Encounter for change or removal of nonsurgical wound dressing: Secondary | ICD-10-CM | POA: Diagnosis not present

## 2019-03-01 DIAGNOSIS — L89153 Pressure ulcer of sacral region, stage 3: Secondary | ICD-10-CM | POA: Diagnosis not present

## 2019-03-01 DIAGNOSIS — F0280 Dementia in other diseases classified elsewhere without behavioral disturbance: Secondary | ICD-10-CM | POA: Diagnosis not present

## 2019-03-04 DIAGNOSIS — M810 Age-related osteoporosis without current pathological fracture: Secondary | ICD-10-CM | POA: Diagnosis not present

## 2019-03-04 DIAGNOSIS — L89153 Pressure ulcer of sacral region, stage 3: Secondary | ICD-10-CM | POA: Diagnosis not present

## 2019-03-04 DIAGNOSIS — Z4800 Encounter for change or removal of nonsurgical wound dressing: Secondary | ICD-10-CM | POA: Diagnosis not present

## 2019-03-04 DIAGNOSIS — E039 Hypothyroidism, unspecified: Secondary | ICD-10-CM | POA: Diagnosis not present

## 2019-03-04 DIAGNOSIS — F0280 Dementia in other diseases classified elsewhere without behavioral disturbance: Secondary | ICD-10-CM | POA: Diagnosis not present

## 2019-03-04 DIAGNOSIS — Z9181 History of falling: Secondary | ICD-10-CM | POA: Diagnosis not present

## 2019-03-04 DIAGNOSIS — G309 Alzheimer's disease, unspecified: Secondary | ICD-10-CM | POA: Diagnosis not present

## 2019-03-04 DIAGNOSIS — F419 Anxiety disorder, unspecified: Secondary | ICD-10-CM | POA: Diagnosis not present

## 2019-03-06 DIAGNOSIS — Z4800 Encounter for change or removal of nonsurgical wound dressing: Secondary | ICD-10-CM | POA: Diagnosis not present

## 2019-03-06 DIAGNOSIS — G309 Alzheimer's disease, unspecified: Secondary | ICD-10-CM | POA: Diagnosis not present

## 2019-03-06 DIAGNOSIS — F0280 Dementia in other diseases classified elsewhere without behavioral disturbance: Secondary | ICD-10-CM | POA: Diagnosis not present

## 2019-03-06 DIAGNOSIS — E039 Hypothyroidism, unspecified: Secondary | ICD-10-CM | POA: Diagnosis not present

## 2019-03-06 DIAGNOSIS — M810 Age-related osteoporosis without current pathological fracture: Secondary | ICD-10-CM | POA: Diagnosis not present

## 2019-03-06 DIAGNOSIS — Z9181 History of falling: Secondary | ICD-10-CM | POA: Diagnosis not present

## 2019-03-06 DIAGNOSIS — L89153 Pressure ulcer of sacral region, stage 3: Secondary | ICD-10-CM | POA: Diagnosis not present

## 2019-03-06 DIAGNOSIS — F419 Anxiety disorder, unspecified: Secondary | ICD-10-CM | POA: Diagnosis not present

## 2019-03-08 DIAGNOSIS — M810 Age-related osteoporosis without current pathological fracture: Secondary | ICD-10-CM | POA: Diagnosis not present

## 2019-03-08 DIAGNOSIS — Z9181 History of falling: Secondary | ICD-10-CM | POA: Diagnosis not present

## 2019-03-08 DIAGNOSIS — G309 Alzheimer's disease, unspecified: Secondary | ICD-10-CM | POA: Diagnosis not present

## 2019-03-08 DIAGNOSIS — E039 Hypothyroidism, unspecified: Secondary | ICD-10-CM | POA: Diagnosis not present

## 2019-03-08 DIAGNOSIS — L89153 Pressure ulcer of sacral region, stage 3: Secondary | ICD-10-CM | POA: Diagnosis not present

## 2019-03-08 DIAGNOSIS — F419 Anxiety disorder, unspecified: Secondary | ICD-10-CM | POA: Diagnosis not present

## 2019-03-08 DIAGNOSIS — F0280 Dementia in other diseases classified elsewhere without behavioral disturbance: Secondary | ICD-10-CM | POA: Diagnosis not present

## 2019-03-08 DIAGNOSIS — Z4800 Encounter for change or removal of nonsurgical wound dressing: Secondary | ICD-10-CM | POA: Diagnosis not present

## 2019-03-11 DIAGNOSIS — G309 Alzheimer's disease, unspecified: Secondary | ICD-10-CM | POA: Diagnosis not present

## 2019-03-11 DIAGNOSIS — E039 Hypothyroidism, unspecified: Secondary | ICD-10-CM | POA: Diagnosis not present

## 2019-03-11 DIAGNOSIS — F419 Anxiety disorder, unspecified: Secondary | ICD-10-CM | POA: Diagnosis not present

## 2019-03-11 DIAGNOSIS — M810 Age-related osteoporosis without current pathological fracture: Secondary | ICD-10-CM | POA: Diagnosis not present

## 2019-03-11 DIAGNOSIS — L89153 Pressure ulcer of sacral region, stage 3: Secondary | ICD-10-CM | POA: Diagnosis not present

## 2019-03-11 DIAGNOSIS — Z9181 History of falling: Secondary | ICD-10-CM | POA: Diagnosis not present

## 2019-03-11 DIAGNOSIS — F0280 Dementia in other diseases classified elsewhere without behavioral disturbance: Secondary | ICD-10-CM | POA: Diagnosis not present

## 2019-03-11 DIAGNOSIS — Z4800 Encounter for change or removal of nonsurgical wound dressing: Secondary | ICD-10-CM | POA: Diagnosis not present

## 2019-03-12 DIAGNOSIS — M810 Age-related osteoporosis without current pathological fracture: Secondary | ICD-10-CM | POA: Diagnosis not present

## 2019-03-12 DIAGNOSIS — L89153 Pressure ulcer of sacral region, stage 3: Secondary | ICD-10-CM | POA: Diagnosis not present

## 2019-03-12 DIAGNOSIS — Z4800 Encounter for change or removal of nonsurgical wound dressing: Secondary | ICD-10-CM | POA: Diagnosis not present

## 2019-03-12 DIAGNOSIS — E039 Hypothyroidism, unspecified: Secondary | ICD-10-CM | POA: Diagnosis not present

## 2019-03-12 DIAGNOSIS — Z9181 History of falling: Secondary | ICD-10-CM | POA: Diagnosis not present

## 2019-03-12 DIAGNOSIS — F419 Anxiety disorder, unspecified: Secondary | ICD-10-CM | POA: Diagnosis not present

## 2019-03-12 DIAGNOSIS — F0280 Dementia in other diseases classified elsewhere without behavioral disturbance: Secondary | ICD-10-CM | POA: Diagnosis not present

## 2019-03-12 DIAGNOSIS — G309 Alzheimer's disease, unspecified: Secondary | ICD-10-CM | POA: Diagnosis not present

## 2019-03-14 DIAGNOSIS — M810 Age-related osteoporosis without current pathological fracture: Secondary | ICD-10-CM | POA: Diagnosis not present

## 2019-03-14 DIAGNOSIS — Z4800 Encounter for change or removal of nonsurgical wound dressing: Secondary | ICD-10-CM | POA: Diagnosis not present

## 2019-03-14 DIAGNOSIS — Z9181 History of falling: Secondary | ICD-10-CM | POA: Diagnosis not present

## 2019-03-14 DIAGNOSIS — G309 Alzheimer's disease, unspecified: Secondary | ICD-10-CM | POA: Diagnosis not present

## 2019-03-14 DIAGNOSIS — F419 Anxiety disorder, unspecified: Secondary | ICD-10-CM | POA: Diagnosis not present

## 2019-03-14 DIAGNOSIS — F0280 Dementia in other diseases classified elsewhere without behavioral disturbance: Secondary | ICD-10-CM | POA: Diagnosis not present

## 2019-03-14 DIAGNOSIS — E039 Hypothyroidism, unspecified: Secondary | ICD-10-CM | POA: Diagnosis not present

## 2019-03-14 DIAGNOSIS — L89153 Pressure ulcer of sacral region, stage 3: Secondary | ICD-10-CM | POA: Diagnosis not present

## 2019-03-15 DIAGNOSIS — E039 Hypothyroidism, unspecified: Secondary | ICD-10-CM | POA: Diagnosis not present

## 2019-03-15 DIAGNOSIS — Z4800 Encounter for change or removal of nonsurgical wound dressing: Secondary | ICD-10-CM | POA: Diagnosis not present

## 2019-03-15 DIAGNOSIS — Z9181 History of falling: Secondary | ICD-10-CM | POA: Diagnosis not present

## 2019-03-15 DIAGNOSIS — F419 Anxiety disorder, unspecified: Secondary | ICD-10-CM | POA: Diagnosis not present

## 2019-03-15 DIAGNOSIS — F0280 Dementia in other diseases classified elsewhere without behavioral disturbance: Secondary | ICD-10-CM | POA: Diagnosis not present

## 2019-03-15 DIAGNOSIS — M810 Age-related osteoporosis without current pathological fracture: Secondary | ICD-10-CM | POA: Diagnosis not present

## 2019-03-15 DIAGNOSIS — G309 Alzheimer's disease, unspecified: Secondary | ICD-10-CM | POA: Diagnosis not present

## 2019-03-15 DIAGNOSIS — L89153 Pressure ulcer of sacral region, stage 3: Secondary | ICD-10-CM | POA: Diagnosis not present

## 2019-03-18 ENCOUNTER — Telehealth: Payer: Self-pay

## 2019-03-18 DIAGNOSIS — E039 Hypothyroidism, unspecified: Secondary | ICD-10-CM | POA: Diagnosis not present

## 2019-03-18 DIAGNOSIS — M810 Age-related osteoporosis without current pathological fracture: Secondary | ICD-10-CM | POA: Diagnosis not present

## 2019-03-18 DIAGNOSIS — Z4800 Encounter for change or removal of nonsurgical wound dressing: Secondary | ICD-10-CM | POA: Diagnosis not present

## 2019-03-18 DIAGNOSIS — L89153 Pressure ulcer of sacral region, stage 3: Secondary | ICD-10-CM | POA: Diagnosis not present

## 2019-03-18 DIAGNOSIS — F0280 Dementia in other diseases classified elsewhere without behavioral disturbance: Secondary | ICD-10-CM | POA: Diagnosis not present

## 2019-03-18 DIAGNOSIS — Z9181 History of falling: Secondary | ICD-10-CM | POA: Diagnosis not present

## 2019-03-18 DIAGNOSIS — F419 Anxiety disorder, unspecified: Secondary | ICD-10-CM | POA: Diagnosis not present

## 2019-03-18 DIAGNOSIS — G309 Alzheimer's disease, unspecified: Secondary | ICD-10-CM | POA: Diagnosis not present

## 2019-03-18 NOTE — Telephone Encounter (Signed)
I caled pts daughter Barbaraann Share that appt on 03/19/2019 will be a video visit due to [place]. I stated it will not be a office visit. Barbaraann Share stated she does not have a cell phone with a camera, only a flip phone. I also stated does anyone in the family have a smart phone with a camera. The daughter stated her kids will be at work during the visit and they work out of town. Also her lap top does not have a camera on it.I stated a message will be sent to [facility] NP. Louise verbalized understanding.

## 2019-03-18 NOTE — Telephone Encounter (Signed)
Please reschedule in August for office visit as she is a high risk patient and would not be appropriate for office visit at this time

## 2019-03-18 NOTE — Telephone Encounter (Signed)
I called pts daughter Barbaraann Share and she stated her mom is at [facility] assisted [facility]. She is on lockdown due to [place]. Pt r/s for end of August 2020 check in time at 315pm. Pts daughter given new appt.

## 2019-03-19 ENCOUNTER — Ambulatory Visit: Payer: Self-pay | Admitting: Adult Health

## 2019-03-19 DIAGNOSIS — F419 Anxiety disorder, unspecified: Secondary | ICD-10-CM | POA: Diagnosis not present

## 2019-03-19 DIAGNOSIS — M810 Age-related osteoporosis without current pathological fracture: Secondary | ICD-10-CM | POA: Diagnosis not present

## 2019-03-19 DIAGNOSIS — E039 Hypothyroidism, unspecified: Secondary | ICD-10-CM | POA: Diagnosis not present

## 2019-03-19 DIAGNOSIS — G309 Alzheimer's disease, unspecified: Secondary | ICD-10-CM | POA: Diagnosis not present

## 2019-03-19 DIAGNOSIS — L89153 Pressure ulcer of sacral region, stage 3: Secondary | ICD-10-CM | POA: Diagnosis not present

## 2019-03-19 DIAGNOSIS — Z4800 Encounter for change or removal of nonsurgical wound dressing: Secondary | ICD-10-CM | POA: Diagnosis not present

## 2019-03-19 DIAGNOSIS — F0280 Dementia in other diseases classified elsewhere without behavioral disturbance: Secondary | ICD-10-CM | POA: Diagnosis not present

## 2019-03-19 DIAGNOSIS — Z9181 History of falling: Secondary | ICD-10-CM | POA: Diagnosis not present

## 2019-03-21 DIAGNOSIS — F0280 Dementia in other diseases classified elsewhere without behavioral disturbance: Secondary | ICD-10-CM | POA: Diagnosis not present

## 2019-03-21 DIAGNOSIS — G309 Alzheimer's disease, unspecified: Secondary | ICD-10-CM | POA: Diagnosis not present

## 2019-03-21 DIAGNOSIS — M810 Age-related osteoporosis without current pathological fracture: Secondary | ICD-10-CM | POA: Diagnosis not present

## 2019-03-21 DIAGNOSIS — Z4800 Encounter for change or removal of nonsurgical wound dressing: Secondary | ICD-10-CM | POA: Diagnosis not present

## 2019-03-21 DIAGNOSIS — L89153 Pressure ulcer of sacral region, stage 3: Secondary | ICD-10-CM | POA: Diagnosis not present

## 2019-03-21 DIAGNOSIS — F419 Anxiety disorder, unspecified: Secondary | ICD-10-CM | POA: Diagnosis not present

## 2019-03-21 DIAGNOSIS — E039 Hypothyroidism, unspecified: Secondary | ICD-10-CM | POA: Diagnosis not present

## 2019-03-21 DIAGNOSIS — Z9181 History of falling: Secondary | ICD-10-CM | POA: Diagnosis not present

## 2019-03-25 DIAGNOSIS — G309 Alzheimer's disease, unspecified: Secondary | ICD-10-CM | POA: Diagnosis not present

## 2019-03-25 DIAGNOSIS — Z4800 Encounter for change or removal of nonsurgical wound dressing: Secondary | ICD-10-CM | POA: Diagnosis not present

## 2019-03-25 DIAGNOSIS — M810 Age-related osteoporosis without current pathological fracture: Secondary | ICD-10-CM | POA: Diagnosis not present

## 2019-03-25 DIAGNOSIS — F419 Anxiety disorder, unspecified: Secondary | ICD-10-CM | POA: Diagnosis not present

## 2019-03-25 DIAGNOSIS — E039 Hypothyroidism, unspecified: Secondary | ICD-10-CM | POA: Diagnosis not present

## 2019-03-25 DIAGNOSIS — Z9181 History of falling: Secondary | ICD-10-CM | POA: Diagnosis not present

## 2019-03-25 DIAGNOSIS — F0280 Dementia in other diseases classified elsewhere without behavioral disturbance: Secondary | ICD-10-CM | POA: Diagnosis not present

## 2019-03-25 DIAGNOSIS — L89153 Pressure ulcer of sacral region, stage 3: Secondary | ICD-10-CM | POA: Diagnosis not present

## 2019-03-28 DIAGNOSIS — E039 Hypothyroidism, unspecified: Secondary | ICD-10-CM | POA: Diagnosis not present

## 2019-03-28 DIAGNOSIS — F419 Anxiety disorder, unspecified: Secondary | ICD-10-CM | POA: Diagnosis not present

## 2019-03-28 DIAGNOSIS — F0280 Dementia in other diseases classified elsewhere without behavioral disturbance: Secondary | ICD-10-CM | POA: Diagnosis not present

## 2019-03-28 DIAGNOSIS — L89153 Pressure ulcer of sacral region, stage 3: Secondary | ICD-10-CM | POA: Diagnosis not present

## 2019-03-28 DIAGNOSIS — Z9181 History of falling: Secondary | ICD-10-CM | POA: Diagnosis not present

## 2019-03-28 DIAGNOSIS — G309 Alzheimer's disease, unspecified: Secondary | ICD-10-CM | POA: Diagnosis not present

## 2019-03-28 DIAGNOSIS — Z4800 Encounter for change or removal of nonsurgical wound dressing: Secondary | ICD-10-CM | POA: Diagnosis not present

## 2019-03-28 DIAGNOSIS — M810 Age-related osteoporosis without current pathological fracture: Secondary | ICD-10-CM | POA: Diagnosis not present

## 2019-03-31 DIAGNOSIS — F419 Anxiety disorder, unspecified: Secondary | ICD-10-CM | POA: Diagnosis not present

## 2019-03-31 DIAGNOSIS — L89153 Pressure ulcer of sacral region, stage 3: Secondary | ICD-10-CM | POA: Diagnosis not present

## 2019-03-31 DIAGNOSIS — G309 Alzheimer's disease, unspecified: Secondary | ICD-10-CM | POA: Diagnosis not present

## 2019-03-31 DIAGNOSIS — E039 Hypothyroidism, unspecified: Secondary | ICD-10-CM | POA: Diagnosis not present

## 2019-03-31 DIAGNOSIS — M810 Age-related osteoporosis without current pathological fracture: Secondary | ICD-10-CM | POA: Diagnosis not present

## 2019-03-31 DIAGNOSIS — Z4800 Encounter for change or removal of nonsurgical wound dressing: Secondary | ICD-10-CM | POA: Diagnosis not present

## 2019-03-31 DIAGNOSIS — F0280 Dementia in other diseases classified elsewhere without behavioral disturbance: Secondary | ICD-10-CM | POA: Diagnosis not present

## 2019-03-31 DIAGNOSIS — Z9181 History of falling: Secondary | ICD-10-CM | POA: Diagnosis not present

## 2019-04-02 DIAGNOSIS — M810 Age-related osteoporosis without current pathological fracture: Secondary | ICD-10-CM | POA: Diagnosis not present

## 2019-04-02 DIAGNOSIS — F0280 Dementia in other diseases classified elsewhere without behavioral disturbance: Secondary | ICD-10-CM | POA: Diagnosis not present

## 2019-04-02 DIAGNOSIS — G309 Alzheimer's disease, unspecified: Secondary | ICD-10-CM | POA: Diagnosis not present

## 2019-04-02 DIAGNOSIS — E039 Hypothyroidism, unspecified: Secondary | ICD-10-CM | POA: Diagnosis not present

## 2019-04-02 DIAGNOSIS — F419 Anxiety disorder, unspecified: Secondary | ICD-10-CM | POA: Diagnosis not present

## 2019-04-02 DIAGNOSIS — Z9181 History of falling: Secondary | ICD-10-CM | POA: Diagnosis not present

## 2019-04-02 DIAGNOSIS — L89153 Pressure ulcer of sacral region, stage 3: Secondary | ICD-10-CM | POA: Diagnosis not present

## 2019-04-02 DIAGNOSIS — Z4800 Encounter for change or removal of nonsurgical wound dressing: Secondary | ICD-10-CM | POA: Diagnosis not present

## 2019-04-04 DIAGNOSIS — G309 Alzheimer's disease, unspecified: Secondary | ICD-10-CM | POA: Diagnosis not present

## 2019-04-04 DIAGNOSIS — Z9181 History of falling: Secondary | ICD-10-CM | POA: Diagnosis not present

## 2019-04-04 DIAGNOSIS — L89153 Pressure ulcer of sacral region, stage 3: Secondary | ICD-10-CM | POA: Diagnosis not present

## 2019-04-04 DIAGNOSIS — F0280 Dementia in other diseases classified elsewhere without behavioral disturbance: Secondary | ICD-10-CM | POA: Diagnosis not present

## 2019-04-04 DIAGNOSIS — Z4800 Encounter for change or removal of nonsurgical wound dressing: Secondary | ICD-10-CM | POA: Diagnosis not present

## 2019-04-04 DIAGNOSIS — M810 Age-related osteoporosis without current pathological fracture: Secondary | ICD-10-CM | POA: Diagnosis not present

## 2019-04-04 DIAGNOSIS — E039 Hypothyroidism, unspecified: Secondary | ICD-10-CM | POA: Diagnosis not present

## 2019-04-04 DIAGNOSIS — F419 Anxiety disorder, unspecified: Secondary | ICD-10-CM | POA: Diagnosis not present

## 2019-04-08 DIAGNOSIS — F419 Anxiety disorder, unspecified: Secondary | ICD-10-CM | POA: Diagnosis not present

## 2019-04-08 DIAGNOSIS — Z9181 History of falling: Secondary | ICD-10-CM | POA: Diagnosis not present

## 2019-04-08 DIAGNOSIS — M810 Age-related osteoporosis without current pathological fracture: Secondary | ICD-10-CM | POA: Diagnosis not present

## 2019-04-08 DIAGNOSIS — Z4800 Encounter for change or removal of nonsurgical wound dressing: Secondary | ICD-10-CM | POA: Diagnosis not present

## 2019-04-08 DIAGNOSIS — L89153 Pressure ulcer of sacral region, stage 3: Secondary | ICD-10-CM | POA: Diagnosis not present

## 2019-04-08 DIAGNOSIS — S51802D Unspecified open wound of left forearm, subsequent encounter: Secondary | ICD-10-CM | POA: Diagnosis not present

## 2019-04-08 DIAGNOSIS — E039 Hypothyroidism, unspecified: Secondary | ICD-10-CM | POA: Diagnosis not present

## 2019-04-08 DIAGNOSIS — F0280 Dementia in other diseases classified elsewhere without behavioral disturbance: Secondary | ICD-10-CM | POA: Diagnosis not present

## 2019-04-08 DIAGNOSIS — G309 Alzheimer's disease, unspecified: Secondary | ICD-10-CM | POA: Diagnosis not present

## 2019-04-10 DIAGNOSIS — E039 Hypothyroidism, unspecified: Secondary | ICD-10-CM | POA: Diagnosis not present

## 2019-04-10 DIAGNOSIS — L89153 Pressure ulcer of sacral region, stage 3: Secondary | ICD-10-CM | POA: Diagnosis not present

## 2019-04-10 DIAGNOSIS — Z4800 Encounter for change or removal of nonsurgical wound dressing: Secondary | ICD-10-CM | POA: Diagnosis not present

## 2019-04-10 DIAGNOSIS — S51802D Unspecified open wound of left forearm, subsequent encounter: Secondary | ICD-10-CM | POA: Diagnosis not present

## 2019-04-10 DIAGNOSIS — M810 Age-related osteoporosis without current pathological fracture: Secondary | ICD-10-CM | POA: Diagnosis not present

## 2019-04-10 DIAGNOSIS — G309 Alzheimer's disease, unspecified: Secondary | ICD-10-CM | POA: Diagnosis not present

## 2019-04-10 DIAGNOSIS — F419 Anxiety disorder, unspecified: Secondary | ICD-10-CM | POA: Diagnosis not present

## 2019-04-10 DIAGNOSIS — F0280 Dementia in other diseases classified elsewhere without behavioral disturbance: Secondary | ICD-10-CM | POA: Diagnosis not present

## 2019-04-10 DIAGNOSIS — Z9181 History of falling: Secondary | ICD-10-CM | POA: Diagnosis not present

## 2019-04-13 DIAGNOSIS — S51802D Unspecified open wound of left forearm, subsequent encounter: Secondary | ICD-10-CM | POA: Diagnosis not present

## 2019-04-13 DIAGNOSIS — L89153 Pressure ulcer of sacral region, stage 3: Secondary | ICD-10-CM | POA: Diagnosis not present

## 2019-04-13 DIAGNOSIS — F0280 Dementia in other diseases classified elsewhere without behavioral disturbance: Secondary | ICD-10-CM | POA: Diagnosis not present

## 2019-04-13 DIAGNOSIS — M810 Age-related osteoporosis without current pathological fracture: Secondary | ICD-10-CM | POA: Diagnosis not present

## 2019-04-13 DIAGNOSIS — F419 Anxiety disorder, unspecified: Secondary | ICD-10-CM | POA: Diagnosis not present

## 2019-04-13 DIAGNOSIS — G309 Alzheimer's disease, unspecified: Secondary | ICD-10-CM | POA: Diagnosis not present

## 2019-04-13 DIAGNOSIS — Z9181 History of falling: Secondary | ICD-10-CM | POA: Diagnosis not present

## 2019-04-13 DIAGNOSIS — Z4800 Encounter for change or removal of nonsurgical wound dressing: Secondary | ICD-10-CM | POA: Diagnosis not present

## 2019-04-13 DIAGNOSIS — E039 Hypothyroidism, unspecified: Secondary | ICD-10-CM | POA: Diagnosis not present

## 2019-04-16 DIAGNOSIS — F0280 Dementia in other diseases classified elsewhere without behavioral disturbance: Secondary | ICD-10-CM | POA: Diagnosis not present

## 2019-04-16 DIAGNOSIS — E039 Hypothyroidism, unspecified: Secondary | ICD-10-CM | POA: Diagnosis not present

## 2019-04-16 DIAGNOSIS — Z9181 History of falling: Secondary | ICD-10-CM | POA: Diagnosis not present

## 2019-04-16 DIAGNOSIS — L89153 Pressure ulcer of sacral region, stage 3: Secondary | ICD-10-CM | POA: Diagnosis not present

## 2019-04-16 DIAGNOSIS — M810 Age-related osteoporosis without current pathological fracture: Secondary | ICD-10-CM | POA: Diagnosis not present

## 2019-04-16 DIAGNOSIS — S51802D Unspecified open wound of left forearm, subsequent encounter: Secondary | ICD-10-CM | POA: Diagnosis not present

## 2019-04-16 DIAGNOSIS — G309 Alzheimer's disease, unspecified: Secondary | ICD-10-CM | POA: Diagnosis not present

## 2019-04-16 DIAGNOSIS — F419 Anxiety disorder, unspecified: Secondary | ICD-10-CM | POA: Diagnosis not present

## 2019-04-16 DIAGNOSIS — Z4800 Encounter for change or removal of nonsurgical wound dressing: Secondary | ICD-10-CM | POA: Diagnosis not present

## 2019-04-18 DIAGNOSIS — R600 Localized edema: Secondary | ICD-10-CM | POA: Diagnosis not present

## 2019-04-18 DIAGNOSIS — G309 Alzheimer's disease, unspecified: Secondary | ICD-10-CM | POA: Diagnosis not present

## 2019-04-18 DIAGNOSIS — Z4800 Encounter for change or removal of nonsurgical wound dressing: Secondary | ICD-10-CM | POA: Diagnosis not present

## 2019-04-18 DIAGNOSIS — E559 Vitamin D deficiency, unspecified: Secondary | ICD-10-CM | POA: Diagnosis not present

## 2019-04-18 DIAGNOSIS — K5900 Constipation, unspecified: Secondary | ICD-10-CM | POA: Diagnosis not present

## 2019-04-18 DIAGNOSIS — F419 Anxiety disorder, unspecified: Secondary | ICD-10-CM | POA: Diagnosis not present

## 2019-04-18 DIAGNOSIS — E039 Hypothyroidism, unspecified: Secondary | ICD-10-CM | POA: Diagnosis not present

## 2019-04-18 DIAGNOSIS — M810 Age-related osteoporosis without current pathological fracture: Secondary | ICD-10-CM | POA: Diagnosis not present

## 2019-04-18 DIAGNOSIS — E785 Hyperlipidemia, unspecified: Secondary | ICD-10-CM | POA: Diagnosis not present

## 2019-04-18 DIAGNOSIS — J309 Allergic rhinitis, unspecified: Secondary | ICD-10-CM | POA: Diagnosis not present

## 2019-04-18 DIAGNOSIS — S51802D Unspecified open wound of left forearm, subsequent encounter: Secondary | ICD-10-CM | POA: Diagnosis not present

## 2019-04-18 DIAGNOSIS — G8929 Other chronic pain: Secondary | ICD-10-CM | POA: Diagnosis not present

## 2019-04-18 DIAGNOSIS — Z9181 History of falling: Secondary | ICD-10-CM | POA: Diagnosis not present

## 2019-04-18 DIAGNOSIS — L89153 Pressure ulcer of sacral region, stage 3: Secondary | ICD-10-CM | POA: Diagnosis not present

## 2019-04-18 DIAGNOSIS — F0280 Dementia in other diseases classified elsewhere without behavioral disturbance: Secondary | ICD-10-CM | POA: Diagnosis not present

## 2019-04-18 DIAGNOSIS — L89150 Pressure ulcer of sacral region, unstageable: Secondary | ICD-10-CM | POA: Diagnosis not present

## 2019-04-18 DIAGNOSIS — M816 Localized osteoporosis [Lequesne]: Secondary | ICD-10-CM | POA: Diagnosis not present

## 2019-04-22 DIAGNOSIS — F0280 Dementia in other diseases classified elsewhere without behavioral disturbance: Secondary | ICD-10-CM | POA: Diagnosis not present

## 2019-04-22 DIAGNOSIS — G301 Alzheimer's disease with late onset: Secondary | ICD-10-CM | POA: Diagnosis not present

## 2019-04-22 DIAGNOSIS — F330 Major depressive disorder, recurrent, mild: Secondary | ICD-10-CM | POA: Diagnosis not present

## 2019-04-23 DIAGNOSIS — S51802D Unspecified open wound of left forearm, subsequent encounter: Secondary | ICD-10-CM | POA: Diagnosis not present

## 2019-04-23 DIAGNOSIS — M810 Age-related osteoporosis without current pathological fracture: Secondary | ICD-10-CM | POA: Diagnosis not present

## 2019-04-23 DIAGNOSIS — F419 Anxiety disorder, unspecified: Secondary | ICD-10-CM | POA: Diagnosis not present

## 2019-04-23 DIAGNOSIS — Z4800 Encounter for change or removal of nonsurgical wound dressing: Secondary | ICD-10-CM | POA: Diagnosis not present

## 2019-04-23 DIAGNOSIS — G309 Alzheimer's disease, unspecified: Secondary | ICD-10-CM | POA: Diagnosis not present

## 2019-04-23 DIAGNOSIS — L89153 Pressure ulcer of sacral region, stage 3: Secondary | ICD-10-CM | POA: Diagnosis not present

## 2019-04-23 DIAGNOSIS — E039 Hypothyroidism, unspecified: Secondary | ICD-10-CM | POA: Diagnosis not present

## 2019-04-23 DIAGNOSIS — F0280 Dementia in other diseases classified elsewhere without behavioral disturbance: Secondary | ICD-10-CM | POA: Diagnosis not present

## 2019-04-23 DIAGNOSIS — Z9181 History of falling: Secondary | ICD-10-CM | POA: Diagnosis not present

## 2019-04-26 DIAGNOSIS — G309 Alzheimer's disease, unspecified: Secondary | ICD-10-CM | POA: Diagnosis not present

## 2019-04-26 DIAGNOSIS — K5900 Constipation, unspecified: Secondary | ICD-10-CM | POA: Diagnosis not present

## 2019-04-26 DIAGNOSIS — S51802D Unspecified open wound of left forearm, subsequent encounter: Secondary | ICD-10-CM | POA: Diagnosis not present

## 2019-04-26 DIAGNOSIS — L89153 Pressure ulcer of sacral region, stage 3: Secondary | ICD-10-CM | POA: Diagnosis not present

## 2019-04-26 DIAGNOSIS — F419 Anxiety disorder, unspecified: Secondary | ICD-10-CM | POA: Diagnosis not present

## 2019-04-26 DIAGNOSIS — M810 Age-related osteoporosis without current pathological fracture: Secondary | ICD-10-CM | POA: Diagnosis not present

## 2019-04-26 DIAGNOSIS — Z9181 History of falling: Secondary | ICD-10-CM | POA: Diagnosis not present

## 2019-04-26 DIAGNOSIS — Z20828 Contact with and (suspected) exposure to other viral communicable diseases: Secondary | ICD-10-CM | POA: Diagnosis not present

## 2019-04-26 DIAGNOSIS — Z4800 Encounter for change or removal of nonsurgical wound dressing: Secondary | ICD-10-CM | POA: Diagnosis not present

## 2019-04-26 DIAGNOSIS — F0280 Dementia in other diseases classified elsewhere without behavioral disturbance: Secondary | ICD-10-CM | POA: Diagnosis not present

## 2019-04-26 DIAGNOSIS — E039 Hypothyroidism, unspecified: Secondary | ICD-10-CM | POA: Diagnosis not present

## 2019-04-30 DIAGNOSIS — F0280 Dementia in other diseases classified elsewhere without behavioral disturbance: Secondary | ICD-10-CM | POA: Diagnosis not present

## 2019-04-30 DIAGNOSIS — G309 Alzheimer's disease, unspecified: Secondary | ICD-10-CM | POA: Diagnosis not present

## 2019-04-30 DIAGNOSIS — Z9181 History of falling: Secondary | ICD-10-CM | POA: Diagnosis not present

## 2019-04-30 DIAGNOSIS — M810 Age-related osteoporosis without current pathological fracture: Secondary | ICD-10-CM | POA: Diagnosis not present

## 2019-04-30 DIAGNOSIS — F419 Anxiety disorder, unspecified: Secondary | ICD-10-CM | POA: Diagnosis not present

## 2019-04-30 DIAGNOSIS — Z4800 Encounter for change or removal of nonsurgical wound dressing: Secondary | ICD-10-CM | POA: Diagnosis not present

## 2019-04-30 DIAGNOSIS — S51802D Unspecified open wound of left forearm, subsequent encounter: Secondary | ICD-10-CM | POA: Diagnosis not present

## 2019-04-30 DIAGNOSIS — E039 Hypothyroidism, unspecified: Secondary | ICD-10-CM | POA: Diagnosis not present

## 2019-04-30 DIAGNOSIS — L89153 Pressure ulcer of sacral region, stage 3: Secondary | ICD-10-CM | POA: Diagnosis not present

## 2019-05-01 DIAGNOSIS — E119 Type 2 diabetes mellitus without complications: Secondary | ICD-10-CM | POA: Diagnosis not present

## 2019-05-01 DIAGNOSIS — D510 Vitamin B12 deficiency anemia due to intrinsic factor deficiency: Secondary | ICD-10-CM | POA: Diagnosis not present

## 2019-05-01 DIAGNOSIS — E039 Hypothyroidism, unspecified: Secondary | ICD-10-CM | POA: Diagnosis not present

## 2019-05-01 DIAGNOSIS — E559 Vitamin D deficiency, unspecified: Secondary | ICD-10-CM | POA: Diagnosis not present

## 2019-05-01 DIAGNOSIS — I10 Essential (primary) hypertension: Secondary | ICD-10-CM | POA: Diagnosis not present

## 2019-05-01 DIAGNOSIS — E785 Hyperlipidemia, unspecified: Secondary | ICD-10-CM | POA: Diagnosis not present

## 2019-05-02 DIAGNOSIS — G309 Alzheimer's disease, unspecified: Secondary | ICD-10-CM | POA: Diagnosis not present

## 2019-05-02 DIAGNOSIS — Z9181 History of falling: Secondary | ICD-10-CM | POA: Diagnosis not present

## 2019-05-02 DIAGNOSIS — F0280 Dementia in other diseases classified elsewhere without behavioral disturbance: Secondary | ICD-10-CM | POA: Diagnosis not present

## 2019-05-02 DIAGNOSIS — S51802D Unspecified open wound of left forearm, subsequent encounter: Secondary | ICD-10-CM | POA: Diagnosis not present

## 2019-05-02 DIAGNOSIS — L89153 Pressure ulcer of sacral region, stage 3: Secondary | ICD-10-CM | POA: Diagnosis not present

## 2019-05-02 DIAGNOSIS — Z4800 Encounter for change or removal of nonsurgical wound dressing: Secondary | ICD-10-CM | POA: Diagnosis not present

## 2019-05-02 DIAGNOSIS — F419 Anxiety disorder, unspecified: Secondary | ICD-10-CM | POA: Diagnosis not present

## 2019-05-02 DIAGNOSIS — E039 Hypothyroidism, unspecified: Secondary | ICD-10-CM | POA: Diagnosis not present

## 2019-05-02 DIAGNOSIS — M810 Age-related osteoporosis without current pathological fracture: Secondary | ICD-10-CM | POA: Diagnosis not present

## 2019-05-06 DIAGNOSIS — F330 Major depressive disorder, recurrent, mild: Secondary | ICD-10-CM | POA: Diagnosis not present

## 2019-05-06 DIAGNOSIS — G301 Alzheimer's disease with late onset: Secondary | ICD-10-CM | POA: Diagnosis not present

## 2019-05-06 DIAGNOSIS — F0280 Dementia in other diseases classified elsewhere without behavioral disturbance: Secondary | ICD-10-CM | POA: Diagnosis not present

## 2019-05-07 DIAGNOSIS — F330 Major depressive disorder, recurrent, mild: Secondary | ICD-10-CM | POA: Diagnosis not present

## 2019-05-07 DIAGNOSIS — L89153 Pressure ulcer of sacral region, stage 3: Secondary | ICD-10-CM | POA: Diagnosis not present

## 2019-05-07 DIAGNOSIS — Z4800 Encounter for change or removal of nonsurgical wound dressing: Secondary | ICD-10-CM | POA: Diagnosis not present

## 2019-05-07 DIAGNOSIS — G309 Alzheimer's disease, unspecified: Secondary | ICD-10-CM | POA: Diagnosis not present

## 2019-05-07 DIAGNOSIS — F419 Anxiety disorder, unspecified: Secondary | ICD-10-CM | POA: Diagnosis not present

## 2019-05-07 DIAGNOSIS — F0280 Dementia in other diseases classified elsewhere without behavioral disturbance: Secondary | ICD-10-CM | POA: Diagnosis not present

## 2019-05-07 DIAGNOSIS — Z9181 History of falling: Secondary | ICD-10-CM | POA: Diagnosis not present

## 2019-05-07 DIAGNOSIS — M810 Age-related osteoporosis without current pathological fracture: Secondary | ICD-10-CM | POA: Diagnosis not present

## 2019-05-07 DIAGNOSIS — S51802D Unspecified open wound of left forearm, subsequent encounter: Secondary | ICD-10-CM | POA: Diagnosis not present

## 2019-05-07 DIAGNOSIS — E039 Hypothyroidism, unspecified: Secondary | ICD-10-CM | POA: Diagnosis not present

## 2019-05-07 DIAGNOSIS — G301 Alzheimer's disease with late onset: Secondary | ICD-10-CM | POA: Diagnosis not present

## 2019-05-08 DIAGNOSIS — E039 Hypothyroidism, unspecified: Secondary | ICD-10-CM | POA: Diagnosis not present

## 2019-05-08 DIAGNOSIS — G309 Alzheimer's disease, unspecified: Secondary | ICD-10-CM | POA: Diagnosis not present

## 2019-05-08 DIAGNOSIS — F419 Anxiety disorder, unspecified: Secondary | ICD-10-CM | POA: Diagnosis not present

## 2019-05-08 DIAGNOSIS — Z4800 Encounter for change or removal of nonsurgical wound dressing: Secondary | ICD-10-CM | POA: Diagnosis not present

## 2019-05-08 DIAGNOSIS — M810 Age-related osteoporosis without current pathological fracture: Secondary | ICD-10-CM | POA: Diagnosis not present

## 2019-05-08 DIAGNOSIS — S51802D Unspecified open wound of left forearm, subsequent encounter: Secondary | ICD-10-CM | POA: Diagnosis not present

## 2019-05-08 DIAGNOSIS — F0280 Dementia in other diseases classified elsewhere without behavioral disturbance: Secondary | ICD-10-CM | POA: Diagnosis not present

## 2019-05-08 DIAGNOSIS — Z9181 History of falling: Secondary | ICD-10-CM | POA: Diagnosis not present

## 2019-05-08 DIAGNOSIS — L89153 Pressure ulcer of sacral region, stage 3: Secondary | ICD-10-CM | POA: Diagnosis not present

## 2019-05-10 DIAGNOSIS — S51802D Unspecified open wound of left forearm, subsequent encounter: Secondary | ICD-10-CM | POA: Diagnosis not present

## 2019-05-10 DIAGNOSIS — L89153 Pressure ulcer of sacral region, stage 3: Secondary | ICD-10-CM | POA: Diagnosis not present

## 2019-05-10 DIAGNOSIS — F0280 Dementia in other diseases classified elsewhere without behavioral disturbance: Secondary | ICD-10-CM | POA: Diagnosis not present

## 2019-05-10 DIAGNOSIS — E039 Hypothyroidism, unspecified: Secondary | ICD-10-CM | POA: Diagnosis not present

## 2019-05-10 DIAGNOSIS — F419 Anxiety disorder, unspecified: Secondary | ICD-10-CM | POA: Diagnosis not present

## 2019-05-10 DIAGNOSIS — G309 Alzheimer's disease, unspecified: Secondary | ICD-10-CM | POA: Diagnosis not present

## 2019-05-10 DIAGNOSIS — Z9181 History of falling: Secondary | ICD-10-CM | POA: Diagnosis not present

## 2019-05-10 DIAGNOSIS — M810 Age-related osteoporosis without current pathological fracture: Secondary | ICD-10-CM | POA: Diagnosis not present

## 2019-05-10 DIAGNOSIS — Z4800 Encounter for change or removal of nonsurgical wound dressing: Secondary | ICD-10-CM | POA: Diagnosis not present

## 2019-05-14 DIAGNOSIS — F0280 Dementia in other diseases classified elsewhere without behavioral disturbance: Secondary | ICD-10-CM | POA: Diagnosis not present

## 2019-05-14 DIAGNOSIS — M810 Age-related osteoporosis without current pathological fracture: Secondary | ICD-10-CM | POA: Diagnosis not present

## 2019-05-14 DIAGNOSIS — Z9181 History of falling: Secondary | ICD-10-CM | POA: Diagnosis not present

## 2019-05-14 DIAGNOSIS — G309 Alzheimer's disease, unspecified: Secondary | ICD-10-CM | POA: Diagnosis not present

## 2019-05-14 DIAGNOSIS — L89153 Pressure ulcer of sacral region, stage 3: Secondary | ICD-10-CM | POA: Diagnosis not present

## 2019-05-14 DIAGNOSIS — F419 Anxiety disorder, unspecified: Secondary | ICD-10-CM | POA: Diagnosis not present

## 2019-05-14 DIAGNOSIS — E039 Hypothyroidism, unspecified: Secondary | ICD-10-CM | POA: Diagnosis not present

## 2019-05-14 DIAGNOSIS — Z4800 Encounter for change or removal of nonsurgical wound dressing: Secondary | ICD-10-CM | POA: Diagnosis not present

## 2019-05-14 DIAGNOSIS — S51802D Unspecified open wound of left forearm, subsequent encounter: Secondary | ICD-10-CM | POA: Diagnosis not present

## 2019-05-15 DIAGNOSIS — E039 Hypothyroidism, unspecified: Secondary | ICD-10-CM | POA: Diagnosis not present

## 2019-05-15 DIAGNOSIS — L89153 Pressure ulcer of sacral region, stage 3: Secondary | ICD-10-CM | POA: Diagnosis not present

## 2019-05-15 DIAGNOSIS — Z4800 Encounter for change or removal of nonsurgical wound dressing: Secondary | ICD-10-CM | POA: Diagnosis not present

## 2019-05-15 DIAGNOSIS — S51802D Unspecified open wound of left forearm, subsequent encounter: Secondary | ICD-10-CM | POA: Diagnosis not present

## 2019-05-15 DIAGNOSIS — M810 Age-related osteoporosis without current pathological fracture: Secondary | ICD-10-CM | POA: Diagnosis not present

## 2019-05-15 DIAGNOSIS — Z9181 History of falling: Secondary | ICD-10-CM | POA: Diagnosis not present

## 2019-05-15 DIAGNOSIS — F0280 Dementia in other diseases classified elsewhere without behavioral disturbance: Secondary | ICD-10-CM | POA: Diagnosis not present

## 2019-05-15 DIAGNOSIS — G309 Alzheimer's disease, unspecified: Secondary | ICD-10-CM | POA: Diagnosis not present

## 2019-05-15 DIAGNOSIS — F419 Anxiety disorder, unspecified: Secondary | ICD-10-CM | POA: Diagnosis not present

## 2019-05-16 DIAGNOSIS — F0280 Dementia in other diseases classified elsewhere without behavioral disturbance: Secondary | ICD-10-CM | POA: Diagnosis not present

## 2019-05-16 DIAGNOSIS — S51802D Unspecified open wound of left forearm, subsequent encounter: Secondary | ICD-10-CM | POA: Diagnosis not present

## 2019-05-16 DIAGNOSIS — Z9181 History of falling: Secondary | ICD-10-CM | POA: Diagnosis not present

## 2019-05-16 DIAGNOSIS — G309 Alzheimer's disease, unspecified: Secondary | ICD-10-CM | POA: Diagnosis not present

## 2019-05-16 DIAGNOSIS — F419 Anxiety disorder, unspecified: Secondary | ICD-10-CM | POA: Diagnosis not present

## 2019-05-16 DIAGNOSIS — E039 Hypothyroidism, unspecified: Secondary | ICD-10-CM | POA: Diagnosis not present

## 2019-05-16 DIAGNOSIS — M810 Age-related osteoporosis without current pathological fracture: Secondary | ICD-10-CM | POA: Diagnosis not present

## 2019-05-16 DIAGNOSIS — L89153 Pressure ulcer of sacral region, stage 3: Secondary | ICD-10-CM | POA: Diagnosis not present

## 2019-05-16 DIAGNOSIS — Z4800 Encounter for change or removal of nonsurgical wound dressing: Secondary | ICD-10-CM | POA: Diagnosis not present

## 2019-05-17 DIAGNOSIS — F0280 Dementia in other diseases classified elsewhere without behavioral disturbance: Secondary | ICD-10-CM | POA: Diagnosis not present

## 2019-05-17 DIAGNOSIS — L89153 Pressure ulcer of sacral region, stage 3: Secondary | ICD-10-CM | POA: Diagnosis not present

## 2019-05-17 DIAGNOSIS — M810 Age-related osteoporosis without current pathological fracture: Secondary | ICD-10-CM | POA: Diagnosis not present

## 2019-05-17 DIAGNOSIS — F419 Anxiety disorder, unspecified: Secondary | ICD-10-CM | POA: Diagnosis not present

## 2019-05-17 DIAGNOSIS — E039 Hypothyroidism, unspecified: Secondary | ICD-10-CM | POA: Diagnosis not present

## 2019-05-17 DIAGNOSIS — G309 Alzheimer's disease, unspecified: Secondary | ICD-10-CM | POA: Diagnosis not present

## 2019-05-17 DIAGNOSIS — Z4800 Encounter for change or removal of nonsurgical wound dressing: Secondary | ICD-10-CM | POA: Diagnosis not present

## 2019-05-17 DIAGNOSIS — Z9181 History of falling: Secondary | ICD-10-CM | POA: Diagnosis not present

## 2019-05-17 DIAGNOSIS — S51802D Unspecified open wound of left forearm, subsequent encounter: Secondary | ICD-10-CM | POA: Diagnosis not present

## 2019-05-20 DIAGNOSIS — L89153 Pressure ulcer of sacral region, stage 3: Secondary | ICD-10-CM | POA: Diagnosis not present

## 2019-05-20 DIAGNOSIS — M810 Age-related osteoporosis without current pathological fracture: Secondary | ICD-10-CM | POA: Diagnosis not present

## 2019-05-20 DIAGNOSIS — F419 Anxiety disorder, unspecified: Secondary | ICD-10-CM | POA: Diagnosis not present

## 2019-05-20 DIAGNOSIS — G309 Alzheimer's disease, unspecified: Secondary | ICD-10-CM | POA: Diagnosis not present

## 2019-05-20 DIAGNOSIS — S51802D Unspecified open wound of left forearm, subsequent encounter: Secondary | ICD-10-CM | POA: Diagnosis not present

## 2019-05-20 DIAGNOSIS — Z9181 History of falling: Secondary | ICD-10-CM | POA: Diagnosis not present

## 2019-05-20 DIAGNOSIS — E039 Hypothyroidism, unspecified: Secondary | ICD-10-CM | POA: Diagnosis not present

## 2019-05-20 DIAGNOSIS — Z4800 Encounter for change or removal of nonsurgical wound dressing: Secondary | ICD-10-CM | POA: Diagnosis not present

## 2019-05-20 DIAGNOSIS — F0280 Dementia in other diseases classified elsewhere without behavioral disturbance: Secondary | ICD-10-CM | POA: Diagnosis not present

## 2019-05-21 DIAGNOSIS — G301 Alzheimer's disease with late onset: Secondary | ICD-10-CM | POA: Diagnosis not present

## 2019-05-21 DIAGNOSIS — F064 Anxiety disorder due to known physiological condition: Secondary | ICD-10-CM | POA: Diagnosis not present

## 2019-05-21 DIAGNOSIS — F339 Major depressive disorder, recurrent, unspecified: Secondary | ICD-10-CM | POA: Diagnosis not present

## 2019-05-22 DIAGNOSIS — L89153 Pressure ulcer of sacral region, stage 3: Secondary | ICD-10-CM | POA: Diagnosis not present

## 2019-05-22 DIAGNOSIS — F0391 Unspecified dementia with behavioral disturbance: Secondary | ICD-10-CM | POA: Diagnosis not present

## 2019-05-22 DIAGNOSIS — E039 Hypothyroidism, unspecified: Secondary | ICD-10-CM | POA: Diagnosis not present

## 2019-05-22 DIAGNOSIS — Z4800 Encounter for change or removal of nonsurgical wound dressing: Secondary | ICD-10-CM | POA: Diagnosis not present

## 2019-05-22 DIAGNOSIS — G309 Alzheimer's disease, unspecified: Secondary | ICD-10-CM | POA: Diagnosis not present

## 2019-05-22 DIAGNOSIS — F0280 Dementia in other diseases classified elsewhere without behavioral disturbance: Secondary | ICD-10-CM | POA: Diagnosis not present

## 2019-05-22 DIAGNOSIS — F419 Anxiety disorder, unspecified: Secondary | ICD-10-CM | POA: Diagnosis not present

## 2019-05-22 DIAGNOSIS — S51802D Unspecified open wound of left forearm, subsequent encounter: Secondary | ICD-10-CM | POA: Diagnosis not present

## 2019-05-22 DIAGNOSIS — Z9181 History of falling: Secondary | ICD-10-CM | POA: Diagnosis not present

## 2019-05-22 DIAGNOSIS — M810 Age-related osteoporosis without current pathological fracture: Secondary | ICD-10-CM | POA: Diagnosis not present

## 2019-05-24 DIAGNOSIS — F419 Anxiety disorder, unspecified: Secondary | ICD-10-CM | POA: Diagnosis not present

## 2019-05-24 DIAGNOSIS — Z9181 History of falling: Secondary | ICD-10-CM | POA: Diagnosis not present

## 2019-05-24 DIAGNOSIS — L89153 Pressure ulcer of sacral region, stage 3: Secondary | ICD-10-CM | POA: Diagnosis not present

## 2019-05-24 DIAGNOSIS — G309 Alzheimer's disease, unspecified: Secondary | ICD-10-CM | POA: Diagnosis not present

## 2019-05-24 DIAGNOSIS — Z4800 Encounter for change or removal of nonsurgical wound dressing: Secondary | ICD-10-CM | POA: Diagnosis not present

## 2019-05-24 DIAGNOSIS — F0280 Dementia in other diseases classified elsewhere without behavioral disturbance: Secondary | ICD-10-CM | POA: Diagnosis not present

## 2019-05-24 DIAGNOSIS — E039 Hypothyroidism, unspecified: Secondary | ICD-10-CM | POA: Diagnosis not present

## 2019-05-24 DIAGNOSIS — M810 Age-related osteoporosis without current pathological fracture: Secondary | ICD-10-CM | POA: Diagnosis not present

## 2019-05-24 DIAGNOSIS — S51802D Unspecified open wound of left forearm, subsequent encounter: Secondary | ICD-10-CM | POA: Diagnosis not present

## 2019-05-27 DIAGNOSIS — E039 Hypothyroidism, unspecified: Secondary | ICD-10-CM | POA: Diagnosis not present

## 2019-05-27 DIAGNOSIS — S51802D Unspecified open wound of left forearm, subsequent encounter: Secondary | ICD-10-CM | POA: Diagnosis not present

## 2019-05-27 DIAGNOSIS — F419 Anxiety disorder, unspecified: Secondary | ICD-10-CM | POA: Diagnosis not present

## 2019-05-27 DIAGNOSIS — Z9181 History of falling: Secondary | ICD-10-CM | POA: Diagnosis not present

## 2019-05-27 DIAGNOSIS — M810 Age-related osteoporosis without current pathological fracture: Secondary | ICD-10-CM | POA: Diagnosis not present

## 2019-05-27 DIAGNOSIS — G309 Alzheimer's disease, unspecified: Secondary | ICD-10-CM | POA: Diagnosis not present

## 2019-05-27 DIAGNOSIS — L89153 Pressure ulcer of sacral region, stage 3: Secondary | ICD-10-CM | POA: Diagnosis not present

## 2019-05-27 DIAGNOSIS — Z4800 Encounter for change or removal of nonsurgical wound dressing: Secondary | ICD-10-CM | POA: Diagnosis not present

## 2019-05-27 DIAGNOSIS — F0280 Dementia in other diseases classified elsewhere without behavioral disturbance: Secondary | ICD-10-CM | POA: Diagnosis not present

## 2019-05-28 DIAGNOSIS — M810 Age-related osteoporosis without current pathological fracture: Secondary | ICD-10-CM | POA: Diagnosis not present

## 2019-05-28 DIAGNOSIS — Z4800 Encounter for change or removal of nonsurgical wound dressing: Secondary | ICD-10-CM | POA: Diagnosis not present

## 2019-05-28 DIAGNOSIS — S51802D Unspecified open wound of left forearm, subsequent encounter: Secondary | ICD-10-CM | POA: Diagnosis not present

## 2019-05-28 DIAGNOSIS — G309 Alzheimer's disease, unspecified: Secondary | ICD-10-CM | POA: Diagnosis not present

## 2019-05-28 DIAGNOSIS — L89153 Pressure ulcer of sacral region, stage 3: Secondary | ICD-10-CM | POA: Diagnosis not present

## 2019-05-28 DIAGNOSIS — F419 Anxiety disorder, unspecified: Secondary | ICD-10-CM | POA: Diagnosis not present

## 2019-05-28 DIAGNOSIS — E039 Hypothyroidism, unspecified: Secondary | ICD-10-CM | POA: Diagnosis not present

## 2019-05-28 DIAGNOSIS — Z9181 History of falling: Secondary | ICD-10-CM | POA: Diagnosis not present

## 2019-05-28 DIAGNOSIS — F0280 Dementia in other diseases classified elsewhere without behavioral disturbance: Secondary | ICD-10-CM | POA: Diagnosis not present

## 2019-05-29 DIAGNOSIS — Z9181 History of falling: Secondary | ICD-10-CM | POA: Diagnosis not present

## 2019-05-29 DIAGNOSIS — L89153 Pressure ulcer of sacral region, stage 3: Secondary | ICD-10-CM | POA: Diagnosis not present

## 2019-05-29 DIAGNOSIS — F0280 Dementia in other diseases classified elsewhere without behavioral disturbance: Secondary | ICD-10-CM | POA: Diagnosis not present

## 2019-05-29 DIAGNOSIS — E039 Hypothyroidism, unspecified: Secondary | ICD-10-CM | POA: Diagnosis not present

## 2019-05-29 DIAGNOSIS — S51802D Unspecified open wound of left forearm, subsequent encounter: Secondary | ICD-10-CM | POA: Diagnosis not present

## 2019-05-29 DIAGNOSIS — Z4800 Encounter for change or removal of nonsurgical wound dressing: Secondary | ICD-10-CM | POA: Diagnosis not present

## 2019-05-29 DIAGNOSIS — M810 Age-related osteoporosis without current pathological fracture: Secondary | ICD-10-CM | POA: Diagnosis not present

## 2019-05-29 DIAGNOSIS — F419 Anxiety disorder, unspecified: Secondary | ICD-10-CM | POA: Diagnosis not present

## 2019-05-29 DIAGNOSIS — G309 Alzheimer's disease, unspecified: Secondary | ICD-10-CM | POA: Diagnosis not present

## 2019-05-31 DIAGNOSIS — G309 Alzheimer's disease, unspecified: Secondary | ICD-10-CM | POA: Diagnosis not present

## 2019-05-31 DIAGNOSIS — Z4800 Encounter for change or removal of nonsurgical wound dressing: Secondary | ICD-10-CM | POA: Diagnosis not present

## 2019-05-31 DIAGNOSIS — E039 Hypothyroidism, unspecified: Secondary | ICD-10-CM | POA: Diagnosis not present

## 2019-05-31 DIAGNOSIS — S51802D Unspecified open wound of left forearm, subsequent encounter: Secondary | ICD-10-CM | POA: Diagnosis not present

## 2019-05-31 DIAGNOSIS — M810 Age-related osteoporosis without current pathological fracture: Secondary | ICD-10-CM | POA: Diagnosis not present

## 2019-05-31 DIAGNOSIS — L89153 Pressure ulcer of sacral region, stage 3: Secondary | ICD-10-CM | POA: Diagnosis not present

## 2019-05-31 DIAGNOSIS — Z9181 History of falling: Secondary | ICD-10-CM | POA: Diagnosis not present

## 2019-05-31 DIAGNOSIS — F419 Anxiety disorder, unspecified: Secondary | ICD-10-CM | POA: Diagnosis not present

## 2019-05-31 DIAGNOSIS — F0280 Dementia in other diseases classified elsewhere without behavioral disturbance: Secondary | ICD-10-CM | POA: Diagnosis not present

## 2019-06-03 DIAGNOSIS — F419 Anxiety disorder, unspecified: Secondary | ICD-10-CM | POA: Diagnosis not present

## 2019-06-03 DIAGNOSIS — S51802D Unspecified open wound of left forearm, subsequent encounter: Secondary | ICD-10-CM | POA: Diagnosis not present

## 2019-06-03 DIAGNOSIS — Z4800 Encounter for change or removal of nonsurgical wound dressing: Secondary | ICD-10-CM | POA: Diagnosis not present

## 2019-06-03 DIAGNOSIS — G301 Alzheimer's disease with late onset: Secondary | ICD-10-CM | POA: Diagnosis not present

## 2019-06-03 DIAGNOSIS — L89153 Pressure ulcer of sacral region, stage 3: Secondary | ICD-10-CM | POA: Diagnosis not present

## 2019-06-03 DIAGNOSIS — F064 Anxiety disorder due to known physiological condition: Secondary | ICD-10-CM | POA: Diagnosis not present

## 2019-06-03 DIAGNOSIS — F0280 Dementia in other diseases classified elsewhere without behavioral disturbance: Secondary | ICD-10-CM | POA: Diagnosis not present

## 2019-06-03 DIAGNOSIS — Z9181 History of falling: Secondary | ICD-10-CM | POA: Diagnosis not present

## 2019-06-03 DIAGNOSIS — E039 Hypothyroidism, unspecified: Secondary | ICD-10-CM | POA: Diagnosis not present

## 2019-06-03 DIAGNOSIS — F0281 Dementia in other diseases classified elsewhere with behavioral disturbance: Secondary | ICD-10-CM | POA: Diagnosis not present

## 2019-06-03 DIAGNOSIS — M810 Age-related osteoporosis without current pathological fracture: Secondary | ICD-10-CM | POA: Diagnosis not present

## 2019-06-03 DIAGNOSIS — F331 Major depressive disorder, recurrent, moderate: Secondary | ICD-10-CM | POA: Diagnosis not present

## 2019-06-03 DIAGNOSIS — G309 Alzheimer's disease, unspecified: Secondary | ICD-10-CM | POA: Diagnosis not present

## 2019-06-06 DIAGNOSIS — G894 Chronic pain syndrome: Secondary | ICD-10-CM | POA: Diagnosis not present

## 2019-06-06 DIAGNOSIS — E039 Hypothyroidism, unspecified: Secondary | ICD-10-CM | POA: Diagnosis not present

## 2019-06-06 DIAGNOSIS — R609 Edema, unspecified: Secondary | ICD-10-CM | POA: Diagnosis not present

## 2019-06-06 DIAGNOSIS — E785 Hyperlipidemia, unspecified: Secondary | ICD-10-CM | POA: Diagnosis not present

## 2019-06-06 DIAGNOSIS — F320 Major depressive disorder, single episode, mild: Secondary | ICD-10-CM | POA: Diagnosis not present

## 2019-06-07 DIAGNOSIS — Z9181 History of falling: Secondary | ICD-10-CM | POA: Diagnosis not present

## 2019-06-07 DIAGNOSIS — M810 Age-related osteoporosis without current pathological fracture: Secondary | ICD-10-CM | POA: Diagnosis not present

## 2019-06-07 DIAGNOSIS — Z4800 Encounter for change or removal of nonsurgical wound dressing: Secondary | ICD-10-CM | POA: Diagnosis not present

## 2019-06-07 DIAGNOSIS — F0280 Dementia in other diseases classified elsewhere without behavioral disturbance: Secondary | ICD-10-CM | POA: Diagnosis not present

## 2019-06-07 DIAGNOSIS — F419 Anxiety disorder, unspecified: Secondary | ICD-10-CM | POA: Diagnosis not present

## 2019-06-07 DIAGNOSIS — E039 Hypothyroidism, unspecified: Secondary | ICD-10-CM | POA: Diagnosis not present

## 2019-06-07 DIAGNOSIS — S51802D Unspecified open wound of left forearm, subsequent encounter: Secondary | ICD-10-CM | POA: Diagnosis not present

## 2019-06-07 DIAGNOSIS — G309 Alzheimer's disease, unspecified: Secondary | ICD-10-CM | POA: Diagnosis not present

## 2019-06-07 DIAGNOSIS — L89153 Pressure ulcer of sacral region, stage 3: Secondary | ICD-10-CM | POA: Diagnosis not present

## 2019-06-10 DIAGNOSIS — G309 Alzheimer's disease, unspecified: Secondary | ICD-10-CM | POA: Diagnosis not present

## 2019-06-10 DIAGNOSIS — Z4800 Encounter for change or removal of nonsurgical wound dressing: Secondary | ICD-10-CM | POA: Diagnosis not present

## 2019-06-10 DIAGNOSIS — E039 Hypothyroidism, unspecified: Secondary | ICD-10-CM | POA: Diagnosis not present

## 2019-06-10 DIAGNOSIS — M810 Age-related osteoporosis without current pathological fracture: Secondary | ICD-10-CM | POA: Diagnosis not present

## 2019-06-10 DIAGNOSIS — Z9181 History of falling: Secondary | ICD-10-CM | POA: Diagnosis not present

## 2019-06-10 DIAGNOSIS — F419 Anxiety disorder, unspecified: Secondary | ICD-10-CM | POA: Diagnosis not present

## 2019-06-10 DIAGNOSIS — L89150 Pressure ulcer of sacral region, unstageable: Secondary | ICD-10-CM | POA: Diagnosis not present

## 2019-06-10 DIAGNOSIS — F0280 Dementia in other diseases classified elsewhere without behavioral disturbance: Secondary | ICD-10-CM | POA: Diagnosis not present

## 2019-06-13 DIAGNOSIS — F0280 Dementia in other diseases classified elsewhere without behavioral disturbance: Secondary | ICD-10-CM | POA: Diagnosis not present

## 2019-06-13 DIAGNOSIS — F419 Anxiety disorder, unspecified: Secondary | ICD-10-CM | POA: Diagnosis not present

## 2019-06-13 DIAGNOSIS — L89150 Pressure ulcer of sacral region, unstageable: Secondary | ICD-10-CM | POA: Diagnosis not present

## 2019-06-13 DIAGNOSIS — M810 Age-related osteoporosis without current pathological fracture: Secondary | ICD-10-CM | POA: Diagnosis not present

## 2019-06-13 DIAGNOSIS — Z9181 History of falling: Secondary | ICD-10-CM | POA: Diagnosis not present

## 2019-06-13 DIAGNOSIS — G309 Alzheimer's disease, unspecified: Secondary | ICD-10-CM | POA: Diagnosis not present

## 2019-06-13 DIAGNOSIS — E039 Hypothyroidism, unspecified: Secondary | ICD-10-CM | POA: Diagnosis not present

## 2019-06-13 DIAGNOSIS — Z4800 Encounter for change or removal of nonsurgical wound dressing: Secondary | ICD-10-CM | POA: Diagnosis not present

## 2019-06-14 DIAGNOSIS — E039 Hypothyroidism, unspecified: Secondary | ICD-10-CM | POA: Diagnosis not present

## 2019-06-14 DIAGNOSIS — Z9181 History of falling: Secondary | ICD-10-CM | POA: Diagnosis not present

## 2019-06-14 DIAGNOSIS — G309 Alzheimer's disease, unspecified: Secondary | ICD-10-CM | POA: Diagnosis not present

## 2019-06-14 DIAGNOSIS — F419 Anxiety disorder, unspecified: Secondary | ICD-10-CM | POA: Diagnosis not present

## 2019-06-14 DIAGNOSIS — Z4800 Encounter for change or removal of nonsurgical wound dressing: Secondary | ICD-10-CM | POA: Diagnosis not present

## 2019-06-14 DIAGNOSIS — M810 Age-related osteoporosis without current pathological fracture: Secondary | ICD-10-CM | POA: Diagnosis not present

## 2019-06-14 DIAGNOSIS — L89150 Pressure ulcer of sacral region, unstageable: Secondary | ICD-10-CM | POA: Diagnosis not present

## 2019-06-14 DIAGNOSIS — F0280 Dementia in other diseases classified elsewhere without behavioral disturbance: Secondary | ICD-10-CM | POA: Diagnosis not present

## 2019-06-28 DIAGNOSIS — Z4800 Encounter for change or removal of nonsurgical wound dressing: Secondary | ICD-10-CM | POA: Diagnosis not present

## 2019-06-28 DIAGNOSIS — E039 Hypothyroidism, unspecified: Secondary | ICD-10-CM | POA: Diagnosis not present

## 2019-06-28 DIAGNOSIS — F0280 Dementia in other diseases classified elsewhere without behavioral disturbance: Secondary | ICD-10-CM | POA: Diagnosis not present

## 2019-06-28 DIAGNOSIS — Z9181 History of falling: Secondary | ICD-10-CM | POA: Diagnosis not present

## 2019-06-28 DIAGNOSIS — G309 Alzheimer's disease, unspecified: Secondary | ICD-10-CM | POA: Diagnosis not present

## 2019-06-28 DIAGNOSIS — F419 Anxiety disorder, unspecified: Secondary | ICD-10-CM | POA: Diagnosis not present

## 2019-06-28 DIAGNOSIS — L89150 Pressure ulcer of sacral region, unstageable: Secondary | ICD-10-CM | POA: Diagnosis not present

## 2019-06-28 DIAGNOSIS — M810 Age-related osteoporosis without current pathological fracture: Secondary | ICD-10-CM | POA: Diagnosis not present

## 2019-07-01 DIAGNOSIS — E039 Hypothyroidism, unspecified: Secondary | ICD-10-CM | POA: Diagnosis not present

## 2019-07-01 DIAGNOSIS — M810 Age-related osteoporosis without current pathological fracture: Secondary | ICD-10-CM | POA: Diagnosis not present

## 2019-07-01 DIAGNOSIS — L89150 Pressure ulcer of sacral region, unstageable: Secondary | ICD-10-CM | POA: Diagnosis not present

## 2019-07-01 DIAGNOSIS — F0280 Dementia in other diseases classified elsewhere without behavioral disturbance: Secondary | ICD-10-CM | POA: Diagnosis not present

## 2019-07-01 DIAGNOSIS — F419 Anxiety disorder, unspecified: Secondary | ICD-10-CM | POA: Diagnosis not present

## 2019-07-01 DIAGNOSIS — Z4800 Encounter for change or removal of nonsurgical wound dressing: Secondary | ICD-10-CM | POA: Diagnosis not present

## 2019-07-01 DIAGNOSIS — G309 Alzheimer's disease, unspecified: Secondary | ICD-10-CM | POA: Diagnosis not present

## 2019-07-01 DIAGNOSIS — Z9181 History of falling: Secondary | ICD-10-CM | POA: Diagnosis not present

## 2019-07-02 DIAGNOSIS — F0281 Dementia in other diseases classified elsewhere with behavioral disturbance: Secondary | ICD-10-CM | POA: Diagnosis not present

## 2019-07-02 DIAGNOSIS — F064 Anxiety disorder due to known physiological condition: Secondary | ICD-10-CM | POA: Diagnosis not present

## 2019-07-02 DIAGNOSIS — F331 Major depressive disorder, recurrent, moderate: Secondary | ICD-10-CM | POA: Diagnosis not present

## 2019-07-02 DIAGNOSIS — G301 Alzheimer's disease with late onset: Secondary | ICD-10-CM | POA: Diagnosis not present

## 2019-07-03 ENCOUNTER — Ambulatory Visit: Payer: Self-pay | Admitting: Adult Health

## 2019-07-03 ENCOUNTER — Encounter: Payer: Self-pay | Admitting: Adult Health

## 2019-07-03 ENCOUNTER — Telehealth: Payer: Self-pay

## 2019-07-03 DIAGNOSIS — E039 Hypothyroidism, unspecified: Secondary | ICD-10-CM | POA: Diagnosis not present

## 2019-07-03 DIAGNOSIS — L89150 Pressure ulcer of sacral region, unstageable: Secondary | ICD-10-CM | POA: Diagnosis not present

## 2019-07-03 DIAGNOSIS — F419 Anxiety disorder, unspecified: Secondary | ICD-10-CM | POA: Diagnosis not present

## 2019-07-03 DIAGNOSIS — Z4800 Encounter for change or removal of nonsurgical wound dressing: Secondary | ICD-10-CM | POA: Diagnosis not present

## 2019-07-03 DIAGNOSIS — G309 Alzheimer's disease, unspecified: Secondary | ICD-10-CM | POA: Diagnosis not present

## 2019-07-03 DIAGNOSIS — Z9181 History of falling: Secondary | ICD-10-CM | POA: Diagnosis not present

## 2019-07-03 DIAGNOSIS — M810 Age-related osteoporosis without current pathological fracture: Secondary | ICD-10-CM | POA: Diagnosis not present

## 2019-07-03 DIAGNOSIS — F0280 Dementia in other diseases classified elsewhere without behavioral disturbance: Secondary | ICD-10-CM | POA: Diagnosis not present

## 2019-07-03 NOTE — Telephone Encounter (Signed)
Patient was a no call/no show for their appointment today.   

## 2019-07-05 DIAGNOSIS — L89150 Pressure ulcer of sacral region, unstageable: Secondary | ICD-10-CM | POA: Diagnosis not present

## 2019-07-05 DIAGNOSIS — F0280 Dementia in other diseases classified elsewhere without behavioral disturbance: Secondary | ICD-10-CM | POA: Diagnosis not present

## 2019-07-05 DIAGNOSIS — E039 Hypothyroidism, unspecified: Secondary | ICD-10-CM | POA: Diagnosis not present

## 2019-07-05 DIAGNOSIS — Z9181 History of falling: Secondary | ICD-10-CM | POA: Diagnosis not present

## 2019-07-05 DIAGNOSIS — F419 Anxiety disorder, unspecified: Secondary | ICD-10-CM | POA: Diagnosis not present

## 2019-07-05 DIAGNOSIS — M810 Age-related osteoporosis without current pathological fracture: Secondary | ICD-10-CM | POA: Diagnosis not present

## 2019-07-05 DIAGNOSIS — Z4800 Encounter for change or removal of nonsurgical wound dressing: Secondary | ICD-10-CM | POA: Diagnosis not present

## 2019-07-05 DIAGNOSIS — G309 Alzheimer's disease, unspecified: Secondary | ICD-10-CM | POA: Diagnosis not present

## 2019-07-08 DIAGNOSIS — F419 Anxiety disorder, unspecified: Secondary | ICD-10-CM | POA: Diagnosis not present

## 2019-07-08 DIAGNOSIS — M810 Age-related osteoporosis without current pathological fracture: Secondary | ICD-10-CM | POA: Diagnosis not present

## 2019-07-08 DIAGNOSIS — L89150 Pressure ulcer of sacral region, unstageable: Secondary | ICD-10-CM | POA: Diagnosis not present

## 2019-07-08 DIAGNOSIS — E039 Hypothyroidism, unspecified: Secondary | ICD-10-CM | POA: Diagnosis not present

## 2019-07-08 DIAGNOSIS — Z9181 History of falling: Secondary | ICD-10-CM | POA: Diagnosis not present

## 2019-07-08 DIAGNOSIS — Z4800 Encounter for change or removal of nonsurgical wound dressing: Secondary | ICD-10-CM | POA: Diagnosis not present

## 2019-07-08 DIAGNOSIS — G309 Alzheimer's disease, unspecified: Secondary | ICD-10-CM | POA: Diagnosis not present

## 2019-07-08 DIAGNOSIS — F0280 Dementia in other diseases classified elsewhere without behavioral disturbance: Secondary | ICD-10-CM | POA: Diagnosis not present

## 2019-07-10 DIAGNOSIS — G309 Alzheimer's disease, unspecified: Secondary | ICD-10-CM | POA: Diagnosis not present

## 2019-07-10 DIAGNOSIS — E039 Hypothyroidism, unspecified: Secondary | ICD-10-CM | POA: Diagnosis not present

## 2019-07-10 DIAGNOSIS — F419 Anxiety disorder, unspecified: Secondary | ICD-10-CM | POA: Diagnosis not present

## 2019-07-10 DIAGNOSIS — M810 Age-related osteoporosis without current pathological fracture: Secondary | ICD-10-CM | POA: Diagnosis not present

## 2019-07-10 DIAGNOSIS — Z9181 History of falling: Secondary | ICD-10-CM | POA: Diagnosis not present

## 2019-07-10 DIAGNOSIS — F0280 Dementia in other diseases classified elsewhere without behavioral disturbance: Secondary | ICD-10-CM | POA: Diagnosis not present

## 2019-07-10 DIAGNOSIS — L89150 Pressure ulcer of sacral region, unstageable: Secondary | ICD-10-CM | POA: Diagnosis not present

## 2019-07-10 DIAGNOSIS — Z4800 Encounter for change or removal of nonsurgical wound dressing: Secondary | ICD-10-CM | POA: Diagnosis not present

## 2019-07-12 DIAGNOSIS — Z4800 Encounter for change or removal of nonsurgical wound dressing: Secondary | ICD-10-CM | POA: Diagnosis not present

## 2019-07-12 DIAGNOSIS — G309 Alzheimer's disease, unspecified: Secondary | ICD-10-CM | POA: Diagnosis not present

## 2019-07-12 DIAGNOSIS — F419 Anxiety disorder, unspecified: Secondary | ICD-10-CM | POA: Diagnosis not present

## 2019-07-12 DIAGNOSIS — L89150 Pressure ulcer of sacral region, unstageable: Secondary | ICD-10-CM | POA: Diagnosis not present

## 2019-07-12 DIAGNOSIS — Z9181 History of falling: Secondary | ICD-10-CM | POA: Diagnosis not present

## 2019-07-12 DIAGNOSIS — F0280 Dementia in other diseases classified elsewhere without behavioral disturbance: Secondary | ICD-10-CM | POA: Diagnosis not present

## 2019-07-12 DIAGNOSIS — M810 Age-related osteoporosis without current pathological fracture: Secondary | ICD-10-CM | POA: Diagnosis not present

## 2019-07-12 DIAGNOSIS — E039 Hypothyroidism, unspecified: Secondary | ICD-10-CM | POA: Diagnosis not present

## 2019-07-15 DIAGNOSIS — G309 Alzheimer's disease, unspecified: Secondary | ICD-10-CM | POA: Diagnosis not present

## 2019-07-15 DIAGNOSIS — M810 Age-related osteoporosis without current pathological fracture: Secondary | ICD-10-CM | POA: Diagnosis not present

## 2019-07-15 DIAGNOSIS — F419 Anxiety disorder, unspecified: Secondary | ICD-10-CM | POA: Diagnosis not present

## 2019-07-15 DIAGNOSIS — F0280 Dementia in other diseases classified elsewhere without behavioral disturbance: Secondary | ICD-10-CM | POA: Diagnosis not present

## 2019-07-15 DIAGNOSIS — Z4800 Encounter for change or removal of nonsurgical wound dressing: Secondary | ICD-10-CM | POA: Diagnosis not present

## 2019-07-15 DIAGNOSIS — Z9181 History of falling: Secondary | ICD-10-CM | POA: Diagnosis not present

## 2019-07-15 DIAGNOSIS — L89150 Pressure ulcer of sacral region, unstageable: Secondary | ICD-10-CM | POA: Diagnosis not present

## 2019-07-15 DIAGNOSIS — E039 Hypothyroidism, unspecified: Secondary | ICD-10-CM | POA: Diagnosis not present

## 2019-07-16 DIAGNOSIS — F064 Anxiety disorder due to known physiological condition: Secondary | ICD-10-CM | POA: Diagnosis not present

## 2019-07-16 DIAGNOSIS — G301 Alzheimer's disease with late onset: Secondary | ICD-10-CM | POA: Diagnosis not present

## 2019-07-16 DIAGNOSIS — F0281 Dementia in other diseases classified elsewhere with behavioral disturbance: Secondary | ICD-10-CM | POA: Diagnosis not present

## 2019-07-16 DIAGNOSIS — F331 Major depressive disorder, recurrent, moderate: Secondary | ICD-10-CM | POA: Diagnosis not present

## 2019-07-17 ENCOUNTER — Telehealth: Payer: Self-pay | Admitting: Adult Health

## 2019-07-17 DIAGNOSIS — E039 Hypothyroidism, unspecified: Secondary | ICD-10-CM | POA: Diagnosis not present

## 2019-07-17 DIAGNOSIS — F419 Anxiety disorder, unspecified: Secondary | ICD-10-CM | POA: Diagnosis not present

## 2019-07-17 DIAGNOSIS — F0280 Dementia in other diseases classified elsewhere without behavioral disturbance: Secondary | ICD-10-CM | POA: Diagnosis not present

## 2019-07-17 DIAGNOSIS — L89150 Pressure ulcer of sacral region, unstageable: Secondary | ICD-10-CM | POA: Diagnosis not present

## 2019-07-17 DIAGNOSIS — G309 Alzheimer's disease, unspecified: Secondary | ICD-10-CM | POA: Diagnosis not present

## 2019-07-17 DIAGNOSIS — Z9181 History of falling: Secondary | ICD-10-CM | POA: Diagnosis not present

## 2019-07-17 DIAGNOSIS — M810 Age-related osteoporosis without current pathological fracture: Secondary | ICD-10-CM | POA: Diagnosis not present

## 2019-07-17 DIAGNOSIS — Z4800 Encounter for change or removal of nonsurgical wound dressing: Secondary | ICD-10-CM | POA: Diagnosis not present

## 2019-07-17 NOTE — Telephone Encounter (Signed)
Noted  

## 2019-07-17 NOTE — Telephone Encounter (Signed)
Link was sent to [email or web address]   Pt understands that although there may be some limitations with this type of visit, we will take all precautions to reduce any security or privacy concerns.  Pt understands that this will be treated like an in office visit and we will file with pt's insurance, and there may be a patient responsible charge related to this service.

## 2019-07-19 DIAGNOSIS — F419 Anxiety disorder, unspecified: Secondary | ICD-10-CM | POA: Diagnosis not present

## 2019-07-19 DIAGNOSIS — E039 Hypothyroidism, unspecified: Secondary | ICD-10-CM | POA: Diagnosis not present

## 2019-07-19 DIAGNOSIS — F0280 Dementia in other diseases classified elsewhere without behavioral disturbance: Secondary | ICD-10-CM | POA: Diagnosis not present

## 2019-07-19 DIAGNOSIS — Z9181 History of falling: Secondary | ICD-10-CM | POA: Diagnosis not present

## 2019-07-19 DIAGNOSIS — G309 Alzheimer's disease, unspecified: Secondary | ICD-10-CM | POA: Diagnosis not present

## 2019-07-19 DIAGNOSIS — Z4800 Encounter for change or removal of nonsurgical wound dressing: Secondary | ICD-10-CM | POA: Diagnosis not present

## 2019-07-19 DIAGNOSIS — L89150 Pressure ulcer of sacral region, unstageable: Secondary | ICD-10-CM | POA: Diagnosis not present

## 2019-07-19 DIAGNOSIS — M810 Age-related osteoporosis without current pathological fracture: Secondary | ICD-10-CM | POA: Diagnosis not present

## 2019-07-21 DIAGNOSIS — F0280 Dementia in other diseases classified elsewhere without behavioral disturbance: Secondary | ICD-10-CM | POA: Diagnosis not present

## 2019-07-21 DIAGNOSIS — Z4800 Encounter for change or removal of nonsurgical wound dressing: Secondary | ICD-10-CM | POA: Diagnosis not present

## 2019-07-21 DIAGNOSIS — G309 Alzheimer's disease, unspecified: Secondary | ICD-10-CM | POA: Diagnosis not present

## 2019-07-21 DIAGNOSIS — E039 Hypothyroidism, unspecified: Secondary | ICD-10-CM | POA: Diagnosis not present

## 2019-07-21 DIAGNOSIS — L89150 Pressure ulcer of sacral region, unstageable: Secondary | ICD-10-CM | POA: Diagnosis not present

## 2019-07-21 DIAGNOSIS — Z9181 History of falling: Secondary | ICD-10-CM | POA: Diagnosis not present

## 2019-07-21 DIAGNOSIS — F419 Anxiety disorder, unspecified: Secondary | ICD-10-CM | POA: Diagnosis not present

## 2019-07-21 DIAGNOSIS — M810 Age-related osteoporosis without current pathological fracture: Secondary | ICD-10-CM | POA: Diagnosis not present

## 2019-07-24 DIAGNOSIS — G309 Alzheimer's disease, unspecified: Secondary | ICD-10-CM | POA: Diagnosis not present

## 2019-07-24 DIAGNOSIS — M810 Age-related osteoporosis without current pathological fracture: Secondary | ICD-10-CM | POA: Diagnosis not present

## 2019-07-24 DIAGNOSIS — E039 Hypothyroidism, unspecified: Secondary | ICD-10-CM | POA: Diagnosis not present

## 2019-07-24 DIAGNOSIS — Z4800 Encounter for change or removal of nonsurgical wound dressing: Secondary | ICD-10-CM | POA: Diagnosis not present

## 2019-07-24 DIAGNOSIS — F419 Anxiety disorder, unspecified: Secondary | ICD-10-CM | POA: Diagnosis not present

## 2019-07-24 DIAGNOSIS — F0280 Dementia in other diseases classified elsewhere without behavioral disturbance: Secondary | ICD-10-CM | POA: Diagnosis not present

## 2019-07-24 DIAGNOSIS — Z9181 History of falling: Secondary | ICD-10-CM | POA: Diagnosis not present

## 2019-07-24 DIAGNOSIS — L89150 Pressure ulcer of sacral region, unstageable: Secondary | ICD-10-CM | POA: Diagnosis not present

## 2019-07-25 DIAGNOSIS — R600 Localized edema: Secondary | ICD-10-CM | POA: Diagnosis not present

## 2019-07-25 DIAGNOSIS — L89303 Pressure ulcer of unspecified buttock, stage 3: Secondary | ICD-10-CM | POA: Diagnosis not present

## 2019-07-25 DIAGNOSIS — R05 Cough: Secondary | ICD-10-CM | POA: Diagnosis not present

## 2019-07-27 DIAGNOSIS — Z4800 Encounter for change or removal of nonsurgical wound dressing: Secondary | ICD-10-CM | POA: Diagnosis not present

## 2019-07-27 DIAGNOSIS — L89150 Pressure ulcer of sacral region, unstageable: Secondary | ICD-10-CM | POA: Diagnosis not present

## 2019-07-27 DIAGNOSIS — G309 Alzheimer's disease, unspecified: Secondary | ICD-10-CM | POA: Diagnosis not present

## 2019-07-27 DIAGNOSIS — E039 Hypothyroidism, unspecified: Secondary | ICD-10-CM | POA: Diagnosis not present

## 2019-07-27 DIAGNOSIS — F419 Anxiety disorder, unspecified: Secondary | ICD-10-CM | POA: Diagnosis not present

## 2019-07-27 DIAGNOSIS — F0280 Dementia in other diseases classified elsewhere without behavioral disturbance: Secondary | ICD-10-CM | POA: Diagnosis not present

## 2019-07-27 DIAGNOSIS — Z9181 History of falling: Secondary | ICD-10-CM | POA: Diagnosis not present

## 2019-07-27 DIAGNOSIS — M810 Age-related osteoporosis without current pathological fracture: Secondary | ICD-10-CM | POA: Diagnosis not present

## 2019-07-30 DIAGNOSIS — F331 Major depressive disorder, recurrent, moderate: Secondary | ICD-10-CM | POA: Diagnosis not present

## 2019-07-30 DIAGNOSIS — F339 Major depressive disorder, recurrent, unspecified: Secondary | ICD-10-CM | POA: Diagnosis not present

## 2019-07-30 DIAGNOSIS — G894 Chronic pain syndrome: Secondary | ICD-10-CM | POA: Diagnosis not present

## 2019-07-30 DIAGNOSIS — F064 Anxiety disorder due to known physiological condition: Secondary | ICD-10-CM | POA: Diagnosis not present

## 2019-07-30 DIAGNOSIS — F0281 Dementia in other diseases classified elsewhere with behavioral disturbance: Secondary | ICD-10-CM | POA: Diagnosis not present

## 2019-07-30 DIAGNOSIS — G301 Alzheimer's disease with late onset: Secondary | ICD-10-CM | POA: Diagnosis not present

## 2019-08-15 ENCOUNTER — Telehealth: Payer: Self-pay | Admitting: Adult Health

## 2019-08-15 ENCOUNTER — Encounter: Payer: Self-pay | Admitting: Adult Health

## 2019-08-15 DIAGNOSIS — H3540 Unspecified peripheral retinal degeneration: Secondary | ICD-10-CM | POA: Diagnosis not present

## 2019-08-15 DIAGNOSIS — H353131 Nonexudative age-related macular degeneration, bilateral, early dry stage: Secondary | ICD-10-CM | POA: Diagnosis not present

## 2019-08-15 NOTE — Telephone Encounter (Signed)
I attempted to contact [facility]'s appointment scheduler, which is where patient resides, regarding rescheduling her 10/19 appointment due to NP out. I LVM for appointment scheduler to call back, but I will also send a letter to [facility] to advise of this cancellation and request for call back.

## 2019-08-19 DIAGNOSIS — R600 Localized edema: Secondary | ICD-10-CM | POA: Diagnosis not present

## 2019-08-19 DIAGNOSIS — E039 Hypothyroidism, unspecified: Secondary | ICD-10-CM | POA: Diagnosis not present

## 2019-08-26 ENCOUNTER — Telehealth: Payer: Self-pay | Admitting: Adult Health

## 2019-08-26 DIAGNOSIS — F419 Anxiety disorder, unspecified: Secondary | ICD-10-CM | POA: Diagnosis not present

## 2019-08-26 DIAGNOSIS — F339 Major depressive disorder, recurrent, unspecified: Secondary | ICD-10-CM | POA: Diagnosis not present

## 2019-08-27 ENCOUNTER — Telehealth: Payer: Self-pay | Admitting: Adult Health

## 2019-08-27 DIAGNOSIS — G301 Alzheimer's disease with late onset: Secondary | ICD-10-CM | POA: Diagnosis not present

## 2019-08-27 DIAGNOSIS — F064 Anxiety disorder due to known physiological condition: Secondary | ICD-10-CM | POA: Diagnosis not present

## 2019-08-27 DIAGNOSIS — F0281 Dementia in other diseases classified elsewhere with behavioral disturbance: Secondary | ICD-10-CM | POA: Diagnosis not present

## 2019-08-27 DIAGNOSIS — F331 Major depressive disorder, recurrent, moderate: Secondary | ICD-10-CM | POA: Diagnosis not present

## 2019-08-27 NOTE — Telephone Encounter (Signed)
Link was sent to [email or web address]   Pt understands([name], Erin) that although there may be some limitations with this type of visit, we will take all precautions to reduce any security or privacy concerns.  Pt understands([name], Erin) that this will be treated like an in office visit and we will file with pt's insurance, and there may be a patient responsible charge related to this service. E mail confirmed as sent

## 2019-09-09 DIAGNOSIS — K5900 Constipation, unspecified: Secondary | ICD-10-CM | POA: Diagnosis not present

## 2019-09-09 DIAGNOSIS — E559 Vitamin D deficiency, unspecified: Secondary | ICD-10-CM | POA: Diagnosis not present

## 2019-09-09 DIAGNOSIS — J309 Allergic rhinitis, unspecified: Secondary | ICD-10-CM | POA: Diagnosis not present

## 2019-09-24 DIAGNOSIS — F0281 Dementia in other diseases classified elsewhere with behavioral disturbance: Secondary | ICD-10-CM | POA: Diagnosis not present

## 2019-09-24 DIAGNOSIS — F331 Major depressive disorder, recurrent, moderate: Secondary | ICD-10-CM | POA: Diagnosis not present

## 2019-09-24 DIAGNOSIS — F064 Anxiety disorder due to known physiological condition: Secondary | ICD-10-CM | POA: Diagnosis not present

## 2019-09-24 DIAGNOSIS — G301 Alzheimer's disease with late onset: Secondary | ICD-10-CM | POA: Diagnosis not present

## 2019-09-27 IMAGING — CT CT HEAD W/O CM
4 series · 15 of 47 positions shown, 17 images · non-contrast
Comparison: CT exams dating back through 06/02/2009.

CLINICAL DATA: Patient has been shaking for the past 2 hours.
History of hypertension, infarct and breast cancer.

EXAM:
CT HEAD WITHOUT CONTRAST
TECHNIQUE: Contiguous axial images were obtained from the base of the skull
through the vertex without intravenous contrast.

[Series 3: head wo · axial · 0.42mm/px · z∈[-144,-38]mm · 7 of 29 slices shown, 9 images]
[im 4/29  brain]
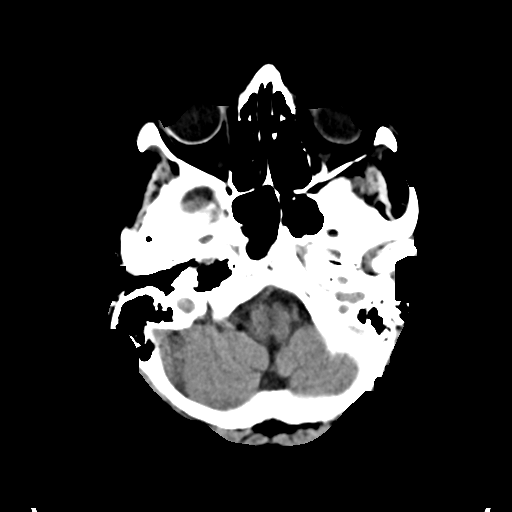
[im 4/29  bone]
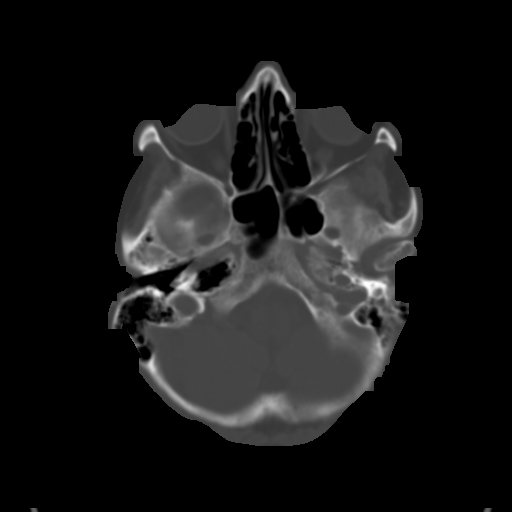
[im 8/29  brain]
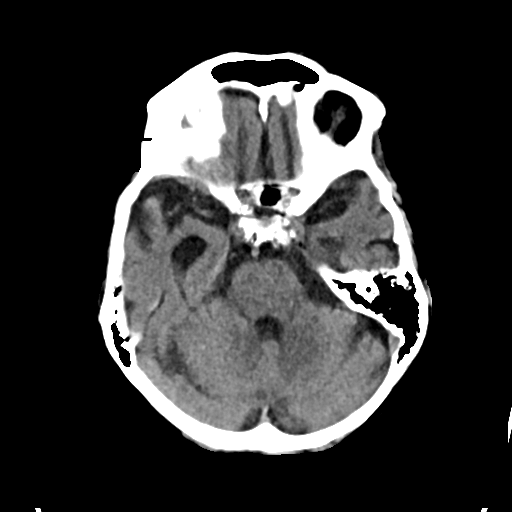
[im 11/29  brain]
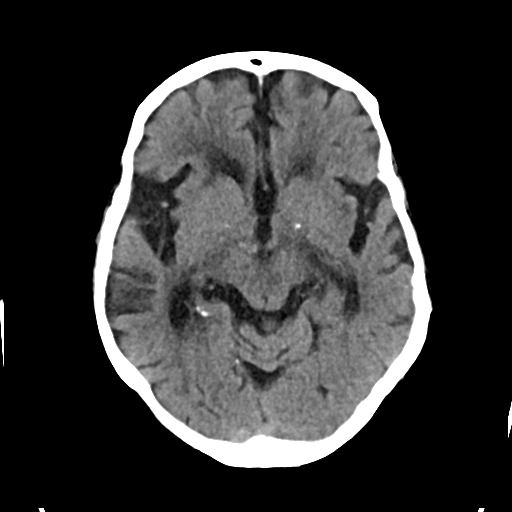
[im 15/29  brain]
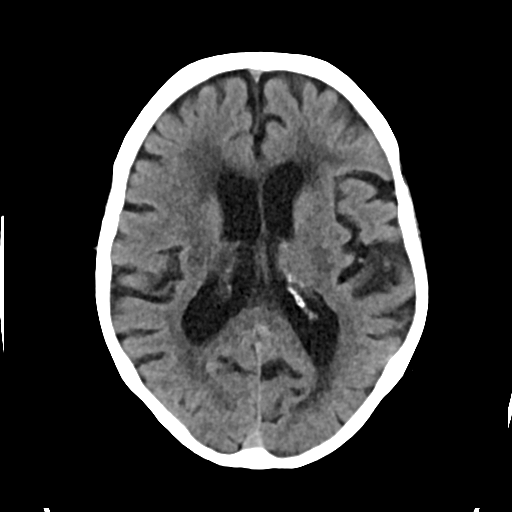
[im 18/29  brain]
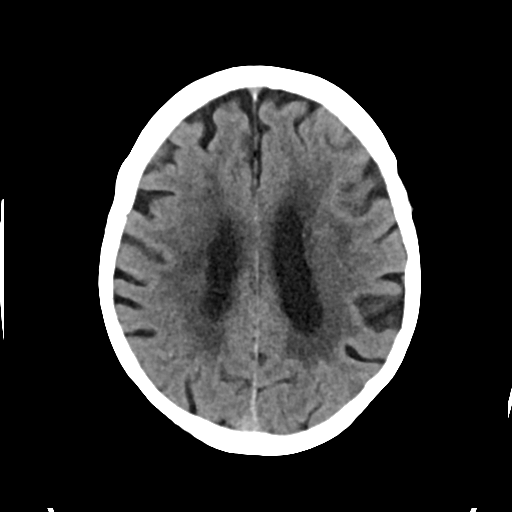
[im 18/29  bone]
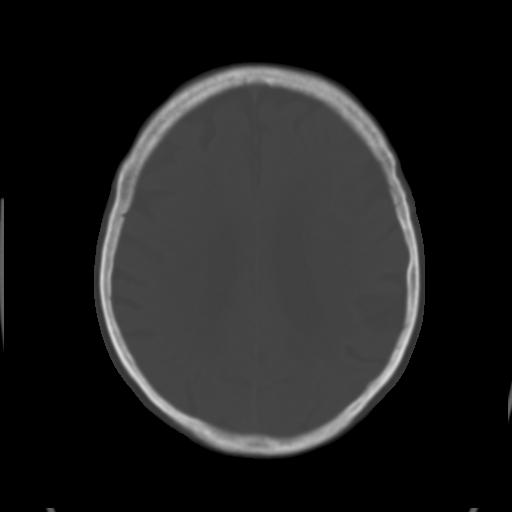
[im 22/29  brain]
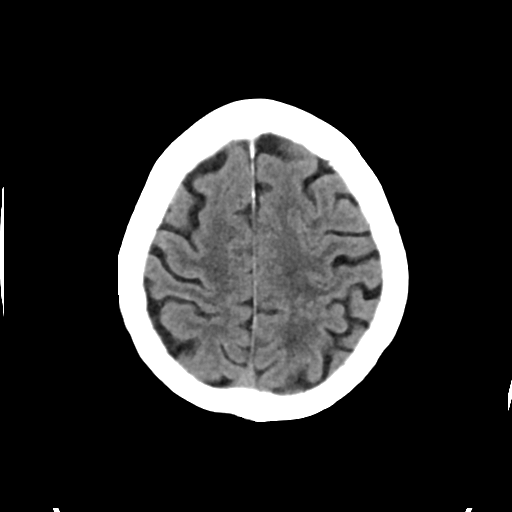
[im 25/29  brain]
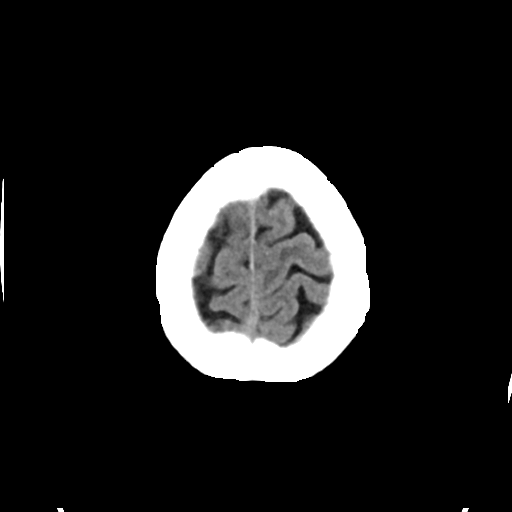

[Series 4: head bone · axial · 0.42mm/px · z∈[-144,-130]mm · 2 of 71 slices shown]
[im 8/71  bone]
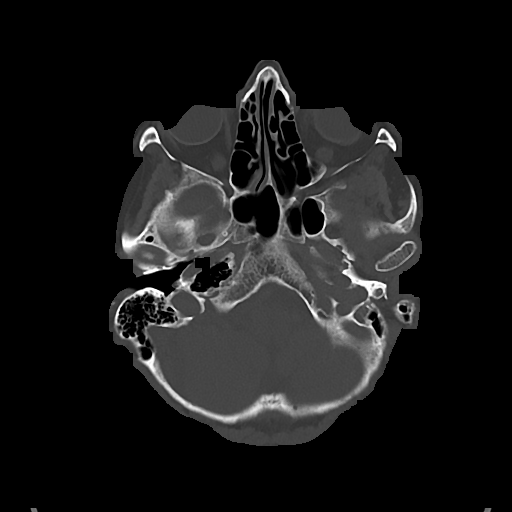
[im 15/71  bone]
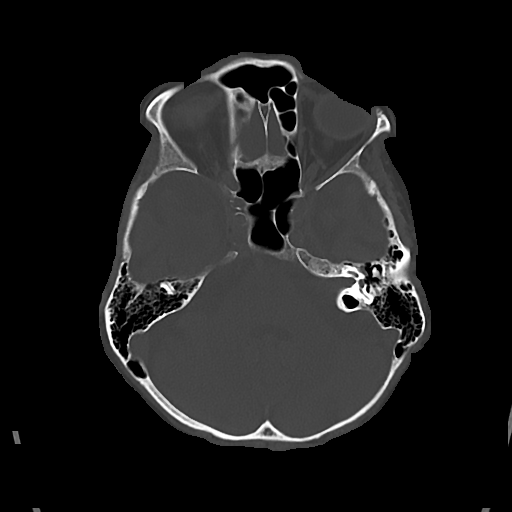

[Series 5: cor soft · coronal · 0.28mm/px · 3 of 67 slices shown]
[im 23/67  brain]
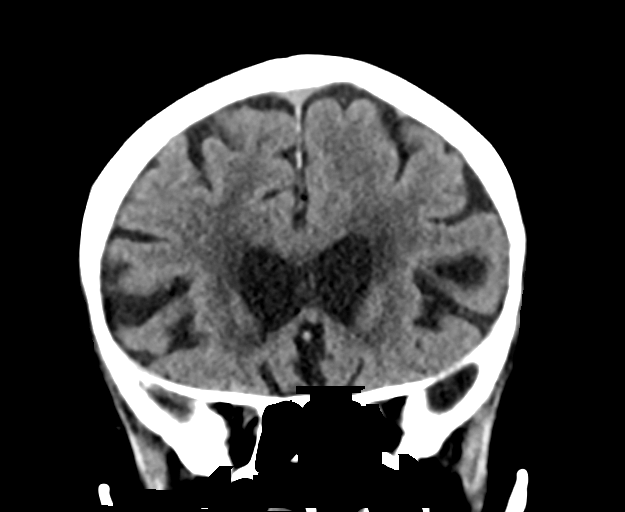
[im 30/67  brain]
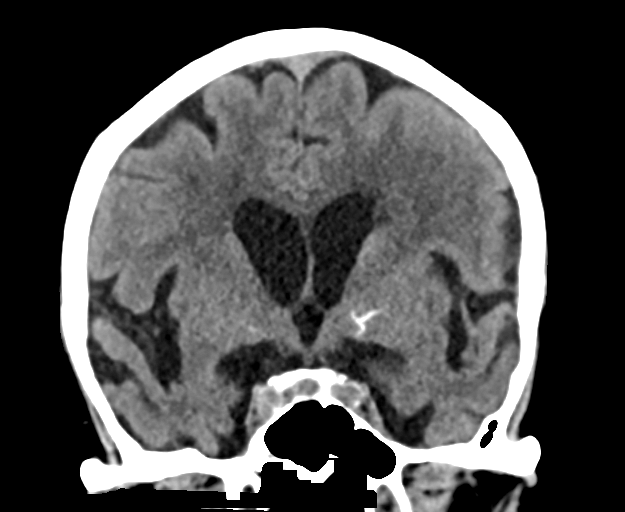
[im 37/67  brain]
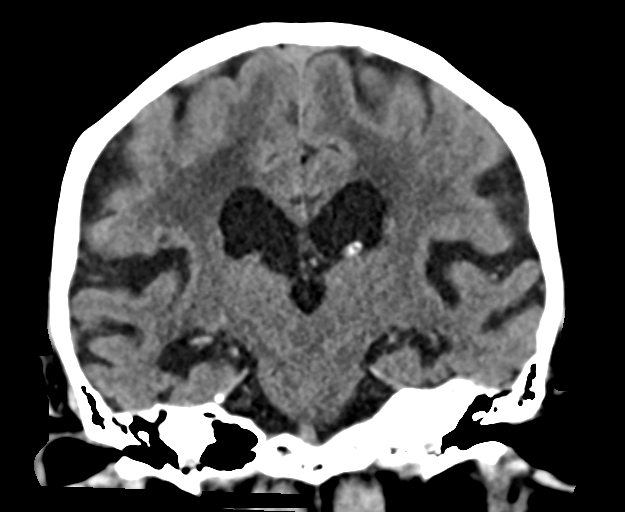

[Series 6: sag soft · sagittal · 0.28mm/px · 3 of 58 slices shown]
[im 20/58  brain]
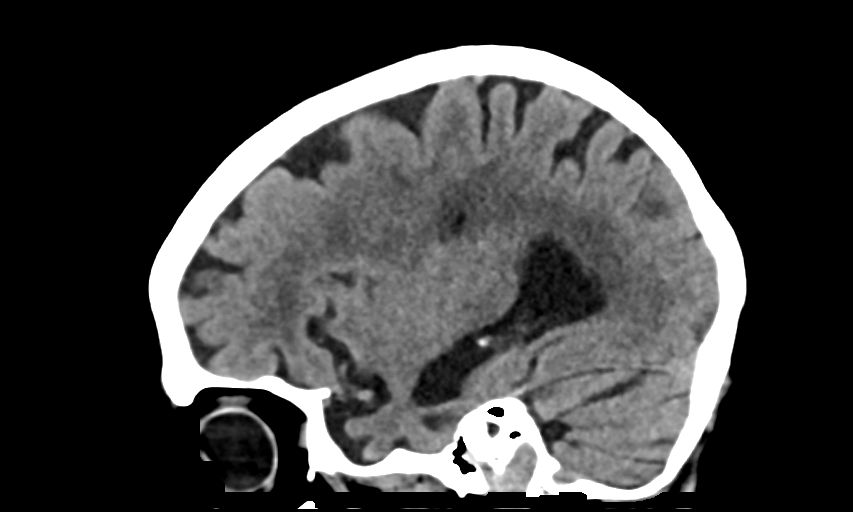
[im 29/58  brain]
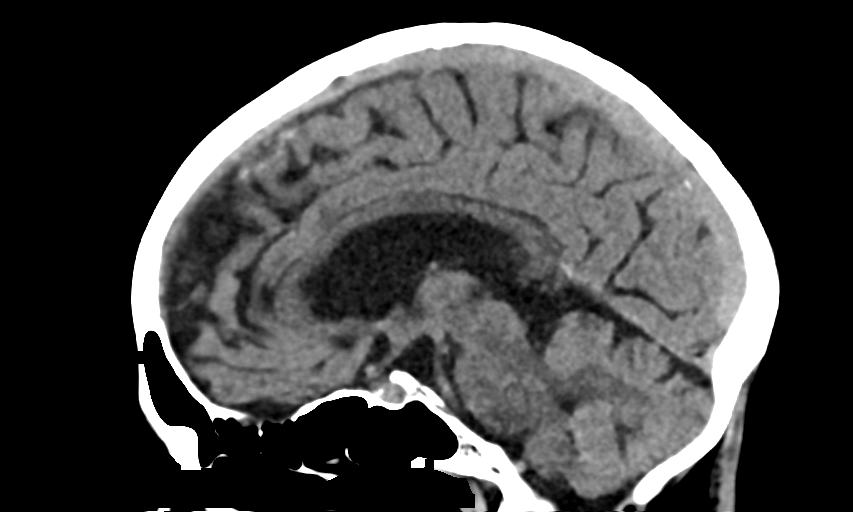
[im 39/58  brain]
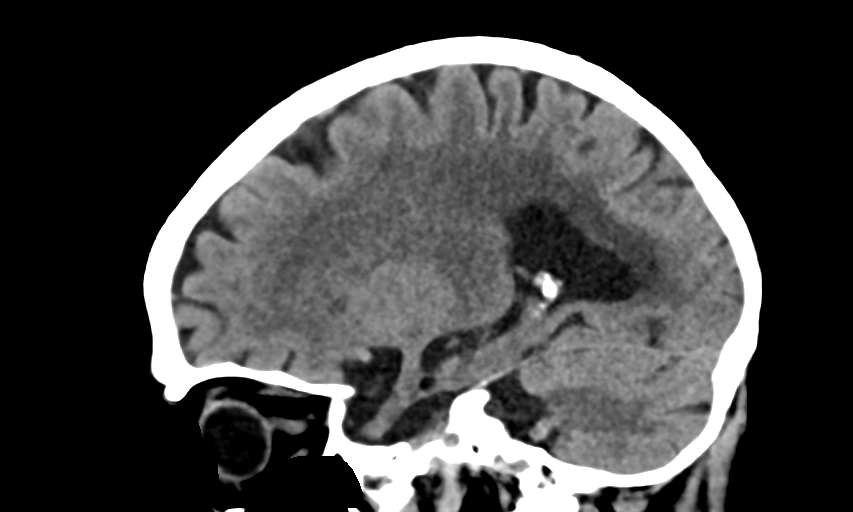

[15 of 47 positions shown; findings below may reference images not displayed]

FINDINGS: Brain: Chronic superficial and central atrophy with moderate degree
of chronic small vessel ischemia involving the periventricular and
subcortical white matter. Chronic small focus of encephalomalacia in
the right corona radiata from remote infarct. Basal ganglial
calcifications are noted bilaterally left greater than right. No
intra-axial mass nor extra-axial fluid collections. No acute
intracranial hemorrhage. No new large vascular territory infarct.
Midline fourth ventricle and basal cisterns without effacement.

Vascular: No hyperdense vessel sign. Mild atherosclerosis of the
carotid siphons.

Skull: No acute skull fracture or suspicious osseous lesions.

Sinuses/Orbits: Bilateral lens replacement. No acute sinus or
mastoid disease.

Other: None
IMPRESSION: Atrophy with chronic moderate small vessel ischemic disease of
periventricular and subcortical white matter. Small focus of chronic
encephalomalacia in the right corona radiata. No acute intracranial
abnormality.

## 2019-10-02 DIAGNOSIS — R05 Cough: Secondary | ICD-10-CM | POA: Diagnosis not present

## 2019-10-08 DIAGNOSIS — Z20828 Contact with and (suspected) exposure to other viral communicable diseases: Secondary | ICD-10-CM | POA: Diagnosis not present

## 2019-10-09 DIAGNOSIS — E039 Hypothyroidism, unspecified: Secondary | ICD-10-CM | POA: Diagnosis not present

## 2019-10-09 DIAGNOSIS — Y95 Nosocomial condition: Secondary | ICD-10-CM | POA: Diagnosis not present

## 2019-10-09 DIAGNOSIS — I492 Junctional premature depolarization: Secondary | ICD-10-CM | POA: Diagnosis not present

## 2019-10-09 DIAGNOSIS — I10 Essential (primary) hypertension: Secondary | ICD-10-CM | POA: Diagnosis not present

## 2019-10-09 DIAGNOSIS — E876 Hypokalemia: Secondary | ICD-10-CM | POA: Diagnosis not present

## 2019-10-09 DIAGNOSIS — N390 Urinary tract infection, site not specified: Secondary | ICD-10-CM | POA: Diagnosis not present

## 2019-10-09 DIAGNOSIS — Z950 Presence of cardiac pacemaker: Secondary | ICD-10-CM | POA: Diagnosis not present

## 2019-10-09 DIAGNOSIS — Z7401 Bed confinement status: Secondary | ICD-10-CM | POA: Diagnosis not present

## 2019-10-09 DIAGNOSIS — F0281 Dementia in other diseases classified elsewhere with behavioral disturbance: Secondary | ICD-10-CM | POA: Diagnosis not present

## 2019-10-09 DIAGNOSIS — R0689 Other abnormalities of breathing: Secondary | ICD-10-CM | POA: Diagnosis not present

## 2019-10-09 DIAGNOSIS — R05 Cough: Secondary | ICD-10-CM | POA: Diagnosis not present

## 2019-10-09 DIAGNOSIS — R410 Disorientation, unspecified: Secondary | ICD-10-CM | POA: Diagnosis not present

## 2019-10-09 DIAGNOSIS — F0280 Dementia in other diseases classified elsewhere without behavioral disturbance: Secondary | ICD-10-CM | POA: Diagnosis not present

## 2019-10-09 DIAGNOSIS — R1310 Dysphagia, unspecified: Secondary | ICD-10-CM | POA: Diagnosis not present

## 2019-10-09 DIAGNOSIS — M2550 Pain in unspecified joint: Secondary | ICD-10-CM | POA: Diagnosis not present

## 2019-10-09 DIAGNOSIS — R0602 Shortness of breath: Secondary | ICD-10-CM | POA: Diagnosis not present

## 2019-10-09 DIAGNOSIS — Z20828 Contact with and (suspected) exposure to other viral communicable diseases: Secondary | ICD-10-CM | POA: Diagnosis not present

## 2019-10-09 DIAGNOSIS — R402411 Glasgow coma scale score 13-15, in the field [EMT or ambulance]: Secondary | ICD-10-CM | POA: Diagnosis not present

## 2019-10-09 DIAGNOSIS — J159 Unspecified bacterial pneumonia: Secondary | ICD-10-CM | POA: Diagnosis not present

## 2019-10-09 DIAGNOSIS — J189 Pneumonia, unspecified organism: Secondary | ICD-10-CM | POA: Diagnosis not present

## 2019-10-09 DIAGNOSIS — J690 Pneumonitis due to inhalation of food and vomit: Secondary | ICD-10-CM | POA: Diagnosis not present

## 2019-10-09 DIAGNOSIS — E870 Hyperosmolality and hypernatremia: Secondary | ICD-10-CM | POA: Diagnosis not present

## 2019-10-09 DIAGNOSIS — G309 Alzheimer's disease, unspecified: Secondary | ICD-10-CM | POA: Diagnosis not present

## 2019-10-09 DIAGNOSIS — R0902 Hypoxemia: Secondary | ICD-10-CM | POA: Diagnosis not present

## 2019-10-09 DIAGNOSIS — R000 Tachycardia, unspecified: Secondary | ICD-10-CM | POA: Diagnosis not present

## 2019-10-09 DIAGNOSIS — J9601 Acute respiratory failure with hypoxia: Secondary | ICD-10-CM | POA: Diagnosis not present

## 2019-10-17 DIAGNOSIS — E039 Hypothyroidism, unspecified: Secondary | ICD-10-CM | POA: Diagnosis not present

## 2019-10-17 DIAGNOSIS — D649 Anemia, unspecified: Secondary | ICD-10-CM | POA: Diagnosis not present

## 2019-10-17 DIAGNOSIS — R6889 Other general symptoms and signs: Secondary | ICD-10-CM | POA: Diagnosis not present

## 2019-10-17 DIAGNOSIS — E559 Vitamin D deficiency, unspecified: Secondary | ICD-10-CM | POA: Diagnosis not present

## 2019-10-20 DIAGNOSIS — R0600 Dyspnea, unspecified: Secondary | ICD-10-CM | POA: Diagnosis not present

## 2019-10-20 DIAGNOSIS — M180 Bilateral primary osteoarthritis of first carpometacarpal joints: Secondary | ICD-10-CM | POA: Diagnosis not present

## 2019-10-20 DIAGNOSIS — L89159 Pressure ulcer of sacral region, unspecified stage: Secondary | ICD-10-CM | POA: Diagnosis not present

## 2019-10-20 DIAGNOSIS — J189 Pneumonia, unspecified organism: Secondary | ICD-10-CM | POA: Diagnosis not present

## 2019-10-20 DIAGNOSIS — E46 Unspecified protein-calorie malnutrition: Secondary | ICD-10-CM | POA: Diagnosis not present

## 2019-10-20 DIAGNOSIS — G309 Alzheimer's disease, unspecified: Secondary | ICD-10-CM | POA: Diagnosis not present

## 2019-10-20 DIAGNOSIS — R2681 Unsteadiness on feet: Secondary | ICD-10-CM | POA: Diagnosis not present

## 2019-10-20 DIAGNOSIS — E039 Hypothyroidism, unspecified: Secondary | ICD-10-CM | POA: Diagnosis not present

## 2019-10-20 DIAGNOSIS — I259 Chronic ischemic heart disease, unspecified: Secondary | ICD-10-CM | POA: Diagnosis not present

## 2019-10-21 ENCOUNTER — Telehealth: Payer: Self-pay | Admitting: Adult Health

## 2019-10-21 ENCOUNTER — Telehealth: Payer: Self-pay

## 2019-10-21 DIAGNOSIS — Z20828 Contact with and (suspected) exposure to other viral communicable diseases: Secondary | ICD-10-CM | POA: Diagnosis not present

## 2019-10-21 NOTE — Progress Notes (Deleted)
PATIENT: Erin [name] DOB: 08-12-1920  REASON FOR VISIT: follow up -anxiety related tremors and memory HISTORY FROM: patient and daughter  HISTORY OF PRESENT ILLNESS:  Virtual visit 10/21/2019: Erin [name] is a 85 year old female who is being seen today via virtual visit for follow-up regarding memory loss and history of tremors.  She unfortunately has not been seen in over the past 1.5 years due to no-shows, COVID-19 concerns and transfer to ALF since prior visit.  She is currently residing at [facility].  It appears as though medication management and monitoring is being done by ALF providers.  Today 03/28/18: Patient returns today for 53-month follow-up appointment and is accompanied by her daughter.  At previous appointment, Erin [name] dose was increased and attempt to possibly treat anxiety related tremors.  At today's appointment, patient states that she no longer has the tremors since dosage increase.  Patient continues to take aspirin without side effects of bleeding or bruising and Zocor without side effects of myalgias for secondary stroke prevention.  Blood pressure today satisfactory at 130/63.  Daughter continues to live with patient to assist with some ADLs and most IADLs.  Patient using rolling walker for ambulation but during ambulation, patient drags her left foot which has been occurring for the past 2 to 3 weeks per patient.  Daughter is unsure of exactly how long this is been going on for.  She did have a fall a few weeks ago resulting in fractured ribs and per daughter, she does not believe imaging was taken of her hip or her knee.  Highly recommended contacting PCP in regards to this.  Memory tested at today's appointment with a decrease in MMSE at 18 (previous 21).  Patient and daughters continue to be reluctant on starting a medication such as Aricept.  Denies any further or new concerns at this time.  Denies new or worsening stroke/TIA symptoms.  History 01/18/18: Recent  hospital admission on 01/16/2018 for tremors.  Erin [name] is a 85 year old Caucasian female who presented to [facility] with complaints of full body tremor which lasted for 20-30 minutes (all previous notes have stated 2 hours but daughter and patient deny that this length).  All symptoms resolved prior to EMS arrival.  No neuro deficits and no postictal symptoms.  Denies losing consciousness or having any alteration in her consciousness.  She denies having electric jolt from her defibrillator firing.  Denies pain or fall.  Per family, she has had a minor episode of similar symptoms in the past but these resolved as well approximately 4-5 months ago.  Denies headache, vision changes, weakness or speech changes.  The tremors were present in all 4 limbs and she was unable to stop the tremoring.  EKG obtained which showed AV paced complexes and otherwise normal.  CT head reviewed and did show atrophy with chronic moderate small vessel ischemic disease with no acute intracranial abnormality.  EEG was not obtained due to unlikeliness that this was seizure activity.  CBC and CMP unremarkable.  Since patient left hospital, she denies any more tremor activity.  Denies any new medications or stopping medications.  She does admit to feeling stressed recently and her daughter states that they have been doing a lot of home renovations where her environment has been changing consistently.  She does state that her memory has been getting worse but continues to deny medication to help with this.  Patient did have labs checked by her PCP and they decreased her Synthroid dose  and all others levels were unremarkable per daughter.  Patient is on Erin [name] 10 mg daily for which she has been on for many years per patient.  Patient is accompanied by her daughter.  HISTORY 07/05/17 (SP);  She returns for follow-up after last visit 6 months ago. She is accompanied by her daughter. Patient continues to have mild memory and cognitive  difficulties which appear to be unchanged. She lives with her daughter. She can be left alone for a few hours. She is mostly independent in her activities and can do most things for herself except changing clothes and bedding. She in fact still writes her own checks. Patient continues to refuse medications like Aricept and Namenda due to fear of side effects. Patient is bothered mostly by hip pain which limits her ambulation. She uses a walker and she is careful and has not had any falls or injuries. She has refused hip surgery which has been suggested. She remains on aspirin 81 mg which is tolerating well without bruising or bleeding.  HISTORY 12/15/2016: Erin [name] is a 85 year old female with a history of stroke and memory disturbance. She returns today for follow-up. She states that overall she is doing well. She does notice changes with her memory. She lives at home with her daughter. She does require some assistance with dressing but can complete most ADLs with minimal or no assistance. Her daughter prepares all meals for her. She has a friend that manages her finances. She states that since her fall in April 3 years ago she has required more assistance in the home. She does have ongoing right hip pain for which she receives injections. She is currently not on any medication for her memory. She remains on aspirin for stroke prevention. She returns today for an evaluation.  HISTORY 06/14/16: Erin [name] is a 85 year old female with a history of stroke and mild memory disturbance. She returns today for follow-up. She continues to live at home with her daughter. She is able to complete all ADLs independently. She remains on aspirin for stroke prevention. Primary care manages her blood pressure and cholesterol. She denies any stroke like symptoms. She does not operate a motor vehicle. States that she sleeps well at night. Good appetite. Continue to have left hip pain. Will have injection next week. She returns  today for an evaluation.  HISTORY 12/10/15: Erin [name] is a 85 year old female with a history of stroke and mild memory disturbance. She returns today for an evaluation. The patient reveals that her memory has remained stable for her age. She states that she lives at her home with her daughter. She reports that she is able to complete all ADLs independently. She also reports that she tends to do some of the household chores herself. She does not operate a motor vehicle. Her primary care manages her blood pressure and cholesterol. She continues on aspirin for stroke prevention. Patient reports that she had a fall and since then she's been having pain in the knees and hips. She states that she recently received injections in the hips for this discomfort. She denies any new neurological symptoms. She returns today for an evaluation.  HISTORY  Update 06/09/2015:She returns for follow-up after last visit 6 months ago. She continues to have mild short-term memory difficulties which appear to have slightly decline. She gets occasional confusion. She had her pacemaker replaced in October. She also had her thyroid medication dose changed. She has not been participating in activities for cognitively challenge  HISTORY: Ms.  Lehne is a 22 year pleasant Caucasian lady with admission to [facility] for stroke on 06/01/09. She was admitted with sudden onset left hemiparesis but presented beyond time window for intervention. Initial Ct head was unremarkable. MRI could not be done due to having a pacemaker. CT angio showed 50-60% subclavian stenosis but no significant carotid or intracranial stenosis. Rt corona radiata white matter infarct was noted on repeat CT head.Cholestrol was 233 with LDL 160.HbA1c was 5.7 She was sent to short term SNF and received PT/OT and went home with home health and finished out patient therapies and has done very well. She was changed from 81 to 325 mg Aspirin   REVIEW OF  SYSTEMS: Out of a complete 14 system review of symptoms, the patient complains only of the following symptoms, and all other reviewed systems are negative.  Tremors and memory loss  ALLERGIES: Allergies  Allergen Reactions  . Lisinopril Cough  . Tetracyclines & Related     Not listed  . Toprol Xl [Metoprolol Succinate] Other (See Comments)    UNKNOWN     HOME MEDICATIONS: Outpatient Medications Prior to Visit  Medication Sig Dispense Refill  . acetaminophen (TYLENOL) 325 MG tablet Take 1 tablet (325 mg total) by mouth every 6 (six) hours as needed.    Marland Kitchen alendronate (FOSAMAX) 70 MG tablet     . aspirin 81 MG tablet Take 1 tablet (81 mg total) by mouth daily. 30 tablet   . Calcium Carb-Cholecalciferol (CALCIUM 1000 + D PO) Take 1,000 Units by mouth daily.     . calcium carbonate (TUMS EX) 750 MG chewable tablet Chew 2 tablets by mouth daily.     . chlordiazePOXIDE (Erin [name]) 5 MG capsule TAKE ONE CAPSULE BY MOUTH IN THE MORNING AND TAKE 2 CAPSULES BY MOUTH IN THE EVENING 60 capsule 4  . CINNAMON PO Take 1,000 mg by mouth 2 (two) times daily.    . Coenzyme Q10 (COQ-10) 400 MG CAPS Take 400 tablets by mouth daily.     Marland Kitchen CRANBERRY FRUIT PO Take 2,000 mg by mouth 2 (two) times daily.     . Fish Oil-Cholecalciferol (FISH OIL + D3 PO) Take 1 capsule by mouth 2 (two) times daily.     . furosemide (LASIX) 20 MG tablet Take one tablet daily.  May take one extra tablet daily as needed for shortness of breath. 90 tablet 3  . Green Tea, Camillia sinensis, ([employer] GREEN TEA EXTRACT PO) Take 2 tablets by mouth 2 (two) times daily.     Marland Kitchen levothyroxine (SYNTHROID, LEVOTHROID) 50 MCG tablet Take 50 mcg by mouth daily before breakfast. Takes every other day.  Alternates with the other.    . levothyroxine (SYNTHROID, LEVOTHROID) 75 MCG tablet Take 75 mcg by mouth daily before breakfast.    . Misc Natural Products (OSTEO BI-FLEX ADV JOINT SHIELD PO) Take 1 tablet by mouth daily.    . simvastatin (ZOCOR) 20 MG  tablet TAKE 1 TABLET (20 MG TOTAL) BY MOUTH DAILY. 90 tablet 0   No facility-administered medications prior to visit.    PAST MEDICAL HISTORY: Past Medical History:  Diagnosis Date  . Anxiety   . BBB (bundle branch block)   . Breast cancer ([place])   . Carotid artery plaque   . Diastolic CHF ([place])   . DVT (deep venous thrombosis) ([place])   . H/O: hysterectomy   . HTN (hypertension)   . Hyperlipidemia   . Hypothyroidism   . Memory loss   .  Stroke [facility])     PAST SURGICAL HISTORY: Past Surgical History:  Procedure Laterality Date  . ABDOMINAL HYSTERECTOMY    . BREAST SURGERY    . CARDIAC CATHETERIZATION  2007  . CAROTID ENDARTERECTOMY    . CARPAL TUNNEL RELEASE    . cataracts    . MASTECTOMY    . PACEMAKER GENERATOR CHANGE  08/08/2014   MDT [name] dual chamber pacemaker generator change by Dr Lovena Le  . PACEMAKER INSERTION    . PERMANENT PACEMAKER GENERATOR CHANGE N/A 08/07/2014   Procedure: PERMANENT PACEMAKER GENERATOR CHANGE;  Surgeon: Evans Lance, MD;  Location: [facility] CATH LAB;  Service: Cardiovascular;  Laterality: N/A;    FAMILY HISTORY: Family History  Problem Relation Age of Onset  . Unexplained death Mother 72  . Cancer Mother   . Hyperlipidemia Mother   . Stroke Mother   . Unexplained death Father 24    SOCIAL HISTORY: Social History   Socioeconomic History  . Marital status: Widowed    Spouse name: Not on file  . Number of children: Not on file  . Years of education: 12th  . Highest education level: Not on file  Occupational History  . Occupation: Retired  Tobacco Use  . Smoking status: Former Smoker    Packs/day: 0.75    Types: Cigarettes  . Smokeless tobacco: Never Used  Substance and Sexual Activity  . Alcohol use: No    Alcohol/week: 0.0 standard drinks  . Drug use: No  . Sexual activity: Not on file  Other Topics Concern  . Not on file  Social History Narrative   Patient is a widow.    Patient lives with daughter.   Patient has 2 adopted  children.   Patient is retired.   Patient is right handed.   Patient has a high school education.         Social Determinants of Health   Financial Resource Strain:   . Difficulty of Paying Living Expenses: Not on file  Food Insecurity:   . Worried About Charity fundraiser in the Last Year: Not on file  . Ran Out of Food in the Last Year: Not on file  Transportation Needs:   . Lack of Transportation (Medical): Not on file  . Lack of Transportation (Non-Medical): Not on file  Physical Activity:   . Days of Exercise per Week: Not on file  . Minutes of Exercise per Session: Not on file  Stress:   . Feeling of Stress : Not on file  Social Connections:   . Frequency of Communication with Friends and Family: Not on file  . Frequency of Social Gatherings with Friends and Family: Not on file  . Attends Religious Services: Not on file  . Active Member of Clubs or Organizations: Not on file  . Attends Archivist Meetings: Not on file  . Marital Status: Not on file  Intimate Partner Violence:   . Fear of Current or Ex-Partner: Not on file  . Emotionally Abused: Not on file  . Physically Abused: Not on file  . Sexually Abused: Not on file      PHYSICAL EXAM Frail petite elderly Caucasian lady. Severe kyphoscoliosis. Favors right hip due to pain There were no vitals filed for this visit. There is no height or weight on file to calculate BMI.   Generalized: Well developed, elderly Caucasian female, in no acute distress  MMSE - Mini Mental State Exam 03/28/2018 12/15/2016 06/14/2016  Orientation to time 3 4  5  Orientation to Place 4 4 4   Registration 3 3 3   Attention/ Calculation 1 2 5   Recall 0 0 0  Language- name 2 objects 2 2 2   Language- repeat 0 1 1  Language- follow 3 step command 3 3 3   Language- read & follow direction 1 1 1   Write a sentence 1 1 1   Copy design 0 0 0  Total score 18 21 25     Neurological examination  Mentation: Alert. Follows all commands  speech and language fluent. Diminished attention, registration.  MMSE 18.  Cranial nerves: Extraocular movements were full, visual field were full on confrontational test. Facial sensation and strength were normal. Uvula tongue midline. Head turning and shoulder shrug  were normal and symmetric. Motor: The motor testing reveals 5 over 5 strength In all extremities with the exception of 4/5 strength in the left lower extremity due to hip discomfort. Good symmetric motor tone is noted throughout.  Sensory: Sensory testing is intact to soft touch on all 4 extremities. No evidence of extinction is noted.  Coordination: Cerebellar testing reveals good finger-nose-finger and heel-to-shin bilaterally.  Gait and station: Patient uses a walker when ambulating and favors right hip due to pain. Reflexes: Deep tendon reflexes are brisk but symmetric and normal bilaterally.   DIAGNOSTIC DATA (LABS, IMAGING, TESTING) - I reviewed patient records, labs, notes, testing and imaging myself where available.  Lab Results  Component Value Date   WBC 3.9 (L) 01/16/2018   HGB 13.2 01/16/2018   HCT 40.3 01/16/2018   MCV 93.7 01/16/2018   PLT 203 01/16/2018      Component Value Date/Time   NA 142 01/16/2018 1617   K 4.1 01/16/2018 1617   CL 107 01/16/2018 1617   CO2 29 01/16/2018 1617   GLUCOSE 90 01/16/2018 1617   BUN 25 (H) 01/16/2018 1617   CREATININE 0.86 01/16/2018 1617   CALCIUM 8.9 01/16/2018 1617   PROT 5.6 (L) 01/16/2018 1617   ALBUMIN 3.1 (L) 01/16/2018 1617   AST 22 01/16/2018 1617   ALT 13 (L) 01/16/2018 1617   ALKPHOS 58 01/16/2018 1617   BILITOT 0.8 01/16/2018 1617   GFRNONAA 56 (L) 01/16/2018 1617   GFRAA >60 01/16/2018 1617     Lab Results  Component Value Date   VITAMINB12 >1999 (H) 09/02/2014   Lab Results  Component Value Date   TSH 0.389 (L) 09/02/2014      ASSESSMENT AND PLAN 85 y.o. year old female  has a past medical history of Anxiety, BBB (bundle branch block),  Breast cancer ([place]), Carotid artery plaque, Diastolic CHF ([place]), DVT (deep venous thrombosis) ([place]), H/O: hysterectomy, HTN (hypertension), Hyperlipidemia, Hypothyroidism, Memory loss, and Stroke ([place]). here with:  1.  Worsening of memory 2.  History of stroke   Patient overall is doing well.  She does have decreased memory but patient and daughter continue to be reluctant on starting any medications at this time.  Advised patient to do memory exercises such as sudoku, word search, crossword puzzles or card games.  Patient recently had a fall a few weeks ago where she sustained fractured ribs.  Since this time, patient has been dragging left foot during ambulation complaining of hip and knee pain.  Question daughter whether imaging was done but she does not believe it was.  Advised daughter to contact PCP in order to have imaging completed.  Daughter stated understanding.  We will follow-up with patient in 6 months time for memory reevaluation.  Greater  than 50% time during this 25 minute consultation visit was spent on counseling and coordination of care about anxiety/stress can induce tremor activity.  Patient and daughter were educated on stress reduction techniques and importance of keeping environment the same with a patient who has dementia.   Frann Rider, AGNP-BC  [facility] [address] [place], [place] [address]  Phone [phone] Fax [phone]  I reviewed the above note and documentation by the Nurse Practitioner and agree with the history, physical exam, assessment and plan as outlined above. I was immediately available for face-to-face consultation. Star Age, MD, PhD [facility])

## 2019-10-21 NOTE — Telephone Encounter (Signed)
Patient was a no show for their virtual visit today.  

## 2019-10-22 NOTE — Telephone Encounter (Signed)
Noted  

## 2019-10-22 NOTE — Telephone Encounter (Signed)
[facility] has called to inform that pt is now at [facility], pt has been r/s for a [email or web address] on 12-17 the link was sent to [email or web address] Claiborne Billings confirmed receiving the link via email

## 2019-10-24 ENCOUNTER — Telehealth (INDEPENDENT_AMBULATORY_CARE_PROVIDER_SITE_OTHER): Payer: Medicare HMO | Admitting: Adult Health

## 2019-10-24 DIAGNOSIS — F0280 Dementia in other diseases classified elsewhere without behavioral disturbance: Secondary | ICD-10-CM

## 2019-10-24 DIAGNOSIS — R251 Tremor, unspecified: Secondary | ICD-10-CM | POA: Diagnosis not present

## 2019-10-24 DIAGNOSIS — F419 Anxiety disorder, unspecified: Secondary | ICD-10-CM | POA: Diagnosis not present

## 2019-10-24 DIAGNOSIS — Z8673 Personal history of transient ischemic attack (TIA), and cerebral infarction without residual deficits: Secondary | ICD-10-CM | POA: Diagnosis not present

## 2019-10-24 DIAGNOSIS — G301 Alzheimer's disease with late onset: Secondary | ICD-10-CM | POA: Diagnosis not present

## 2019-10-24 NOTE — Progress Notes (Signed)
PATIENT: Erin [name] DOB: 06/18/1920  REASON FOR VISIT: follow up -anxiety related tremors and memory HISTORY FROM: Erin Billings, RN (SNF)  Virtual Visit via Video Note  I connected with Beckey Downing on 10/24/19 at  3:45 PM EST by a video enabled telemedicine application located at [facility] and verified that I am speaking with the correct person using two identifiers who was located at [facility] accompanied by Erin [name], Therapist, sports.   Braxton Feathers Arts administrator) scheduled visit who discussed the limitations of evaluation and management by telemedicine and the availability of in person appointments.  Facility expressed understanding and agreed to proceed.    HISTORY OF PRESENT ILLNESS:  Virtual visit 10/24/2019: Ms. Erin [name] is a 85 year old female who is being seen today via virtual visit for follow-up regarding memory loss and history of tremors.  She is accompanied by facility nurse, Erin [name].  She unfortunately has not been seen in over the past 1.5 years due to no-shows, COVID-19 concerns and transfer to ALF since prior visit.  She was previously residing at [facility] but unfortunately had hospital admission on 10/09/2019 with aspiration pneumonia due to severe dysphagia from Alzheimer's dementia and was discharged to [facility] for hospice.  She has not been officially placed on hospice at this time but social work currently working on that.  Majority of medications have been discontinued except for comfort type medications but she has continued on Librium.  Blood pressure monitored at facility but has been stable.  She limitedly interacts with staff and will answer questions occasionally but otherwise sleeping.  No specific concerns or questions from nursing staff at this time.  Update 03/28/18: Patient returns today for 4-month follow-up appointment and is accompanied by her daughter.  At previous appointment, Librium dose was increased and attempt  to possibly treat anxiety related tremors.  At today's appointment, patient states that she no longer has the tremors since dosage increase.  Patient continues to take aspirin without side effects of bleeding or bruising and Zocor without side effects of myalgias for secondary stroke prevention.  Blood pressure today satisfactory at 130/63.  Daughter continues to live with patient to assist with some ADLs and most IADLs.  Patient using rolling walker for ambulation but during ambulation, patient drags her left foot which has been occurring for the past 2 to 3 weeks per patient.  Daughter is unsure of exactly how long this is been going on for.  She did have a fall a few weeks ago resulting in fractured ribs and per daughter, she does not believe imaging was taken of her hip or her knee.  Highly recommended contacting PCP in regards to this.  Memory tested at today's appointment with a decrease in MMSE at 18 (previous 21).  Patient and daughters continue to be reluctant on starting a medication such as Aricept.  Denies any further or new concerns at this time.  Denies new or worsening stroke/TIA symptoms.  History 01/18/18: Recent hospital admission on 01/16/2018 for tremors.  Ms. Erin [name] is a 85 year old Caucasian female who presented to [facility] with complaints of full body tremor which lasted for 20-30 minutes (all previous notes have stated 2 hours but daughter and patient deny that this length).  All symptoms resolved prior to EMS arrival.  No neuro deficits and no postictal symptoms.  Denies losing consciousness or having any alteration in her consciousness.  She denies having electric jolt from her defibrillator firing.  Denies pain or fall.  Per family, she has had a minor episode of similar symptoms in the past but these resolved as well approximately 4-5 months ago.  Denies headache, vision changes, weakness or speech changes.  The tremors were present in all 4 limbs and she was unable to stop the  tremoring.  EKG obtained which showed AV paced complexes and otherwise normal.  CT head reviewed and did show atrophy with chronic moderate small vessel ischemic disease with no acute intracranial abnormality.  EEG was not obtained due to unlikeliness that this was seizure activity.  CBC and CMP unremarkable.  Since patient left hospital, she denies any more tremor activity.  Denies any new medications or stopping medications.  She does admit to feeling stressed recently and her daughter states that they have been doing a lot of home renovations where her environment has been changing consistently.  She does state that her memory has been getting worse but continues to deny medication to help with this.  Patient did have labs checked by her PCP and they decreased her Synthroid dose and all others levels were unremarkable per daughter.  Patient is on Librium 10 mg daily for which she has been on for many years per patient.  Patient is accompanied by her daughter.     ALLERGIES: Allergies  Allergen Reactions  . Lisinopril Cough  . Tetracyclines & Related     Not listed  . Toprol Xl [Metoprolol Succinate] Other (See Comments)    UNKNOWN     HOME MEDICATIONS: Outpatient Medications Prior to Visit  Medication Sig Dispense Refill  . chlordiazePOXIDE (LIBRIUM) 10 MG capsule Take 10 mg by mouth at bedtime.    . cholecalciferol (VITAMIN D3) 25 MCG (1000 UT) tablet Take 2,000 Units by mouth daily.    Marland Kitchen levothyroxine (SYNTHROID, LEVOTHROID) 75 MCG tablet Take 75 mcg by mouth daily before breakfast.    . sertraline (ZOLOFT) 25 MG tablet Take 25 mg by mouth daily.    . vitamin B-12 (CYANOCOBALAMIN) 1000 MCG tablet Take 1,000 mcg by mouth daily.    . polyethylene glycol (MIRALAX / GLYCOLAX) 17 g packet Take 17 g by mouth daily.    Marland Kitchen acetaminophen (TYLENOL) 325 MG tablet Take 1 tablet (325 mg total) by mouth every 6 (six) hours as needed.    Marland Kitchen alendronate (FOSAMAX) 70 MG tablet     . aspirin 81 MG tablet  Take 1 tablet (81 mg total) by mouth daily. 30 tablet   . Calcium Carb-Cholecalciferol (CALCIUM 1000 + D PO) Take 1,000 Units by mouth daily.     . calcium carbonate (TUMS EX) 750 MG chewable tablet Chew 2 tablets by mouth daily.     . chlordiazePOXIDE (LIBRIUM) 5 MG capsule TAKE ONE CAPSULE BY MOUTH IN THE MORNING AND TAKE 2 CAPSULES BY MOUTH IN THE EVENING 60 capsule 4  . CINNAMON PO Take 1,000 mg by mouth 2 (two) times daily.    . Coenzyme Q10 (COQ-10) 400 MG CAPS Take 400 tablets by mouth daily.     Marland Kitchen CRANBERRY FRUIT PO Take 2,000 mg by mouth 2 (two) times daily.     . Fish Oil-Cholecalciferol (FISH OIL + D3 PO) Take 1 capsule by mouth 2 (two) times daily.     . furosemide (LASIX) 20 MG tablet Take one tablet daily.  May take one extra tablet daily as needed for shortness of breath. 90 tablet 3  . Green Tea, Camillia sinensis, ([employer] GREEN TEA EXTRACT PO) Take 2 tablets by mouth 2 (  two) times daily.     Marland Kitchen levothyroxine (SYNTHROID, LEVOTHROID) 50 MCG tablet Take 50 mcg by mouth daily before breakfast. Takes every other day.  Alternates with the other.    . Misc Natural Products (OSTEO BI-FLEX ADV JOINT SHIELD PO) Take 1 tablet by mouth daily.    . sertraline (ZOLOFT) 100 MG tablet Take 100 mg by mouth daily.    . simvastatin (ZOCOR) 20 MG tablet TAKE 1 TABLET (20 MG TOTAL) BY MOUTH DAILY. 90 tablet 0   No facility-administered medications prior to visit.    PAST MEDICAL HISTORY: Past Medical History:  Diagnosis Date  . Anxiety   . BBB (bundle branch block)   . Breast cancer ([place])   . Carotid artery plaque   . Diastolic CHF ([place])   . DVT (deep venous thrombosis) ([place])   . H/O: hysterectomy   . HTN (hypertension)   . Hyperlipidemia   . Hypothyroidism   . Memory loss   . Stroke [facility])     PAST SURGICAL HISTORY: Past Surgical History:  Procedure Laterality Date  . ABDOMINAL HYSTERECTOMY    . BREAST SURGERY    . CARDIAC CATHETERIZATION  2007  . CAROTID ENDARTERECTOMY    . CARPAL  TUNNEL RELEASE    . cataracts    . MASTECTOMY    . PACEMAKER GENERATOR CHANGE  08/08/2014   MDT [name] dual chamber pacemaker generator change by Dr Lovena Le  . PACEMAKER INSERTION    . PERMANENT PACEMAKER GENERATOR CHANGE N/A 08/07/2014   Procedure: PERMANENT PACEMAKER GENERATOR CHANGE;  Surgeon: Evans Lance, MD;  [facility] C[facility] LAB;  Service: Cardiovascular;  Laterality: N/A;    FAMILY HISTORY: Family History  Problem Relation Age of Onset  . Unexplained death Mother 22  . Cancer Mother   . Hyperlipidemia Mother   . Stroke Mother   . Unexplained death Father 69    SOCIAL HISTORY: Social History   Socioeconomic History  . Marital status: Widowed    Spouse name: Not on file  . Number of children: Not on file  . Years of education: 12th  . Highest education level: Not on file  Occupational History  . Occupation: Retired  Tobacco Use  . Smoking status: Former Smoker    Packs/day: 0.75    Types: Cigarettes  . Smokeless tobacco: Never Used  Substance and Sexual Activity  . Alcohol use: No    Alcohol/week: 0.0 standard drinks  . Drug use: No  . Sexual activity: Not on file  Other Topics Concern  . Not on file  Social History Narrative   Patient is a widow.    Patient lives with daughter.   Patient has 2 adopted children.   Patient is retired.   Patient is right handed.   Patient has a high school education.         Social Determinants of Health   Financial Resource Strain:   . Difficulty of Paying Living Expenses: Not on file  Food Insecurity:   . Worried About Charity fundraiser in the Last Year: Not on file  . Ran Out of Food in the Last Year: Not on file  Transportation Needs:   . Lack of Transportation (Medical): Not on file  . Lack of Transportation (Non-Medical): Not on file  Physical Activity:   . Days of Exercise per Week: Not on file  . Minutes of Exercise per Session: Not on file  Stress:   . Feeling of Stress : Not on file  Social  Connections:   . Frequency of Communication with Friends and Family: Not on file  . Frequency of Social Gatherings with Friends and Family: Not on file  . Attends Religious Services: Not on file  . Active Member of Clubs or Organizations: Not on file  . Attends Archivist Meetings: Not on file  . Marital Status: Not on file  Intimate Partner Violence:   . Fear of Current or Ex-Partner: Not on file  . Emotionally Abused: Not on file  . Physically Abused: Not on file  . Sexually Abused: Not on file      PHYSICAL EXAM Limited exam due to visit type and patient's current clinical condition  General: Frail petite elderly Caucasian lady laying in bed with eyes closed Mentation: Drowsy.  Unable to assess cognition or speech as patient did not answer questions which is now normal for her per nursing staff Motor: Unable to test due to cognitive state and current clinical condition.       ASSESSMENT AND PLAN 85 y.o. year old female  has a past medical history of Anxiety, BBB (bundle branch block), Breast cancer ([place]), Carotid artery plaque, Diastolic CHF ([place]), DVT (deep venous thrombosis) ([place]), H/O: hysterectomy, HTN (hypertension), Hyperlipidemia, Hypothyroidism, Memory loss, and Stroke ([place]). here with:  1.  Worsening of memory 2.  Likely anxiety provoking tremors 3.  History of stroke  Today's follow-up occurred after not seen patient since 03/2018.  She was transferred to [facility] at some point since prior visit and unfortunately on 10/09/2019 had hospitalization for aspiration pneumonia due to severe dysphagia from advanced dementia and transferred to [facility] facility for hospice care.  Majority of medications continued except comfort type medications.  Medication list updated.  No recommended changes at this time and ongoing monitoring/management will be done by facility and hospice team.  Due to current condition and being on hospice, patient discharged from  clinic but advised nursing staff to call with any questions or concerns   Greater than 50% time during this 15-minute nonface-to-face visit was spent on counseling and coordination of care about recent hospitalization, current clinical status and reviewed current medication list with updates.   Frann Rider, AGNP-BC  [facility] [address] [place], [place] [address]  Phone [phone] Fax [phone]  I reviewed the above note and documentation by the Nurse Practitioner and agree with the history, physical exam, assessment and plan as outlined above. I was immediately available for face-to-face consultation. Star Age, MD, PhD [facility])

## 2019-10-25 DIAGNOSIS — R627 Adult failure to thrive: Secondary | ICD-10-CM | POA: Diagnosis not present

## 2019-10-25 DIAGNOSIS — F0281 Dementia in other diseases classified elsewhere with behavioral disturbance: Secondary | ICD-10-CM | POA: Diagnosis not present

## 2019-10-25 DIAGNOSIS — J9691 Respiratory failure, unspecified with hypoxia: Secondary | ICD-10-CM | POA: Diagnosis not present

## 2019-10-25 DIAGNOSIS — L89151 Pressure ulcer of sacral region, stage 1: Secondary | ICD-10-CM | POA: Diagnosis not present

## 2019-10-25 DIAGNOSIS — Z8709 Personal history of other diseases of the respiratory system: Secondary | ICD-10-CM | POA: Diagnosis not present

## 2019-10-25 DIAGNOSIS — Z8673 Personal history of transient ischemic attack (TIA), and cerebral infarction without residual deficits: Secondary | ICD-10-CM | POA: Diagnosis not present

## 2019-10-25 DIAGNOSIS — R1310 Dysphagia, unspecified: Secondary | ICD-10-CM | POA: Diagnosis not present

## 2019-10-25 DIAGNOSIS — I2510 Atherosclerotic heart disease of native coronary artery without angina pectoris: Secondary | ICD-10-CM | POA: Diagnosis not present

## 2019-10-25 DIAGNOSIS — G309 Alzheimer's disease, unspecified: Secondary | ICD-10-CM | POA: Diagnosis not present

## 2019-10-25 NOTE — Progress Notes (Signed)
I agree with the above plan 

## 2019-10-30 DIAGNOSIS — F0390 Unspecified dementia without behavioral disturbance: Secondary | ICD-10-CM | POA: Diagnosis not present

## 2019-10-30 DIAGNOSIS — F419 Anxiety disorder, unspecified: Secondary | ICD-10-CM | POA: Diagnosis not present

## 2019-10-30 DIAGNOSIS — G301 Alzheimer's disease with late onset: Secondary | ICD-10-CM | POA: Diagnosis not present

## 2019-10-30 DIAGNOSIS — F329 Major depressive disorder, single episode, unspecified: Secondary | ICD-10-CM | POA: Diagnosis not present

## 2019-11-04 ENCOUNTER — Telehealth: Payer: Self-pay | Admitting: Internal Medicine

## 2019-11-04 NOTE — Telephone Encounter (Signed)
New message    Pt POA called to set up yearly appt. She has question about pt and her diagnosis. She wants to know if pt needs to continue to see the doctor because she was given 6 months to a year.

## 2019-11-05 NOTE — Telephone Encounter (Signed)
Returned call to [employer].  Advised that Pt does not need to be brought to office.  That Pt could be followed remotely and she can contact us for an appt if Pt is having any issues.  POA is trying to get Pt's home monitor from the assisted living where she was prior to transferring to [facility].  Advised to call device clinic when she obtains monitor so that we can schedule remotes.  Advised if she cannot get the monitor back to call device clinic and we will send a new one.  Device clinic # given to POA.  All f/u's with Dr. Lovena Le cancelled.  Await further needs.

## 2019-11-12 ENCOUNTER — Telehealth: Payer: Self-pay

## 2019-11-12 NOTE — Telephone Encounter (Signed)
The pt daughter called to set up an home remote appointment. I set the appointment up for tomorrow. I told her the nurse at the facility can call my direct office number to get help sending the transmission. I told Barbaraann Share that I will give her a call to let her know if we got the transmission or not. If we have to order a new monitor I will send it to [place] and she will take it to the facility.

## 2019-11-13 ENCOUNTER — Ambulatory Visit (INDEPENDENT_AMBULATORY_CARE_PROVIDER_SITE_OTHER): Payer: Medicare Other | Admitting: *Deleted

## 2019-11-13 DIAGNOSIS — I442 Atrioventricular block, complete: Secondary | ICD-10-CM

## 2019-11-14 LAB — CUP PACEART REMOTE DEVICE CHECK
Battery Impedance: 925 Ohm
Battery Remaining Longevity: 56 mo
Battery Voltage: 2.78 V
Brady Statistic AP VP Percent: 7 %
Brady Statistic AP VS Percent: 0 %
Brady Statistic AS VP Percent: 93 %
Brady Statistic AS VS Percent: 0 %
Date Time Interrogation Session: 20210106135045
Implantable Lead Implant Date: 20070316
Implantable Lead Implant Date: 20070316
Implantable Lead Location: 753859
Implantable Lead Location: 753860
Implantable Lead Model: 4469
Implantable Lead Model: 4470
Implantable Lead Serial Number: 478729
Implantable Lead Serial Number: 523845
Implantable Pulse Generator Implant Date: 20150930
Lead Channel Impedance Value: 469 Ohm
Lead Channel Impedance Value: 533 Ohm
Lead Channel Pacing Threshold Amplitude: 0.375 V
Lead Channel Pacing Threshold Amplitude: 0.625 V
Lead Channel Pacing Threshold Pulse Width: 0.4 ms
Lead Channel Pacing Threshold Pulse Width: 0.4 ms
Lead Channel Setting Pacing Amplitude: 2 V
Lead Channel Setting Pacing Amplitude: 2.5 V
Lead Channel Setting Pacing Pulse Width: 0.4 ms
Lead Channel Setting Sensing Sensitivity: 4 mV

## 2019-12-02 DIAGNOSIS — E039 Hypothyroidism, unspecified: Secondary | ICD-10-CM | POA: Diagnosis not present

## 2019-12-02 DIAGNOSIS — Z79899 Other long term (current) drug therapy: Secondary | ICD-10-CM | POA: Diagnosis not present

## 2019-12-18 DIAGNOSIS — G4700 Insomnia, unspecified: Secondary | ICD-10-CM | POA: Diagnosis not present

## 2019-12-18 DIAGNOSIS — F329 Major depressive disorder, single episode, unspecified: Secondary | ICD-10-CM | POA: Diagnosis not present

## 2019-12-18 DIAGNOSIS — F419 Anxiety disorder, unspecified: Secondary | ICD-10-CM | POA: Diagnosis not present

## 2019-12-18 DIAGNOSIS — G309 Alzheimer's disease, unspecified: Secondary | ICD-10-CM | POA: Diagnosis not present

## 2019-12-18 DIAGNOSIS — F0391 Unspecified dementia with behavioral disturbance: Secondary | ICD-10-CM | POA: Diagnosis not present

## 2019-12-25 DIAGNOSIS — G309 Alzheimer's disease, unspecified: Secondary | ICD-10-CM | POA: Diagnosis not present

## 2019-12-25 DIAGNOSIS — F419 Anxiety disorder, unspecified: Secondary | ICD-10-CM | POA: Diagnosis not present

## 2019-12-25 DIAGNOSIS — F0391 Unspecified dementia with behavioral disturbance: Secondary | ICD-10-CM | POA: Diagnosis not present

## 2019-12-25 DIAGNOSIS — G4700 Insomnia, unspecified: Secondary | ICD-10-CM | POA: Diagnosis not present

## 2019-12-25 DIAGNOSIS — F329 Major depressive disorder, single episode, unspecified: Secondary | ICD-10-CM | POA: Diagnosis not present

## 2020-01-03 ENCOUNTER — Encounter: Payer: Medicare HMO | Admitting: Internal Medicine

## 2020-01-08 DIAGNOSIS — F419 Anxiety disorder, unspecified: Secondary | ICD-10-CM | POA: Diagnosis not present

## 2020-01-08 DIAGNOSIS — G4700 Insomnia, unspecified: Secondary | ICD-10-CM | POA: Diagnosis not present

## 2020-01-08 DIAGNOSIS — G309 Alzheimer's disease, unspecified: Secondary | ICD-10-CM | POA: Diagnosis not present

## 2020-01-08 DIAGNOSIS — F0391 Unspecified dementia with behavioral disturbance: Secondary | ICD-10-CM | POA: Diagnosis not present

## 2020-01-08 DIAGNOSIS — F329 Major depressive disorder, single episode, unspecified: Secondary | ICD-10-CM | POA: Diagnosis not present

## 2020-01-23 DIAGNOSIS — L89151 Pressure ulcer of sacral region, stage 1: Secondary | ICD-10-CM | POA: Diagnosis not present

## 2020-01-23 DIAGNOSIS — R1310 Dysphagia, unspecified: Secondary | ICD-10-CM | POA: Diagnosis not present

## 2020-01-23 DIAGNOSIS — I2510 Atherosclerotic heart disease of native coronary artery without angina pectoris: Secondary | ICD-10-CM | POA: Diagnosis not present

## 2020-01-23 DIAGNOSIS — Z8709 Personal history of other diseases of the respiratory system: Secondary | ICD-10-CM | POA: Diagnosis not present

## 2020-01-23 DIAGNOSIS — R627 Adult failure to thrive: Secondary | ICD-10-CM | POA: Diagnosis not present

## 2020-01-23 DIAGNOSIS — F0281 Dementia in other diseases classified elsewhere with behavioral disturbance: Secondary | ICD-10-CM | POA: Diagnosis not present

## 2020-01-23 DIAGNOSIS — G309 Alzheimer's disease, unspecified: Secondary | ICD-10-CM | POA: Diagnosis not present

## 2020-01-23 DIAGNOSIS — Z8673 Personal history of transient ischemic attack (TIA), and cerebral infarction without residual deficits: Secondary | ICD-10-CM | POA: Diagnosis not present

## 2020-01-23 DIAGNOSIS — J9691 Respiratory failure, unspecified with hypoxia: Secondary | ICD-10-CM | POA: Diagnosis not present

## 2020-01-29 DIAGNOSIS — F329 Major depressive disorder, single episode, unspecified: Secondary | ICD-10-CM | POA: Diagnosis not present

## 2020-01-29 DIAGNOSIS — F419 Anxiety disorder, unspecified: Secondary | ICD-10-CM | POA: Diagnosis not present

## 2020-01-29 DIAGNOSIS — G4700 Insomnia, unspecified: Secondary | ICD-10-CM | POA: Diagnosis not present

## 2020-01-29 DIAGNOSIS — F0391 Unspecified dementia with behavioral disturbance: Secondary | ICD-10-CM | POA: Diagnosis not present

## 2020-01-29 DIAGNOSIS — G309 Alzheimer's disease, unspecified: Secondary | ICD-10-CM | POA: Diagnosis not present

## 2020-02-05 NOTE — Telephone Encounter (Signed)
error 

## 2020-02-12 ENCOUNTER — Ambulatory Visit (INDEPENDENT_AMBULATORY_CARE_PROVIDER_SITE_OTHER): Payer: Medicare HMO | Admitting: *Deleted

## 2020-02-12 DIAGNOSIS — F0391 Unspecified dementia with behavioral disturbance: Secondary | ICD-10-CM | POA: Diagnosis not present

## 2020-02-12 DIAGNOSIS — G309 Alzheimer's disease, unspecified: Secondary | ICD-10-CM | POA: Diagnosis not present

## 2020-02-12 DIAGNOSIS — F329 Major depressive disorder, single episode, unspecified: Secondary | ICD-10-CM | POA: Diagnosis not present

## 2020-02-12 DIAGNOSIS — I442 Atrioventricular block, complete: Secondary | ICD-10-CM | POA: Diagnosis not present

## 2020-02-12 DIAGNOSIS — F419 Anxiety disorder, unspecified: Secondary | ICD-10-CM | POA: Diagnosis not present

## 2020-02-12 DIAGNOSIS — G4700 Insomnia, unspecified: Secondary | ICD-10-CM | POA: Diagnosis not present

## 2020-02-12 LAB — CUP PACEART REMOTE DEVICE CHECK
Battery Impedance: 1115 Ohm
Battery Remaining Longevity: 50 mo
Battery Voltage: 2.77 V
Brady Statistic AP VP Percent: 7 %
Brady Statistic AP VS Percent: 0 %
Brady Statistic AS VP Percent: 93 %
Brady Statistic AS VS Percent: 0 %
Date Time Interrogation Session: 20210407123924
Implantable Lead Implant Date: 20070316
Implantable Lead Implant Date: 20070316
Implantable Lead Location: 753859
Implantable Lead Location: 753860
Implantable Lead Model: 4469
Implantable Lead Model: 4470
Implantable Lead Serial Number: 478729
Implantable Lead Serial Number: 523845
Implantable Pulse Generator Implant Date: 20150930
Lead Channel Impedance Value: 497 Ohm
Lead Channel Impedance Value: 567 Ohm
Lead Channel Pacing Threshold Amplitude: 0.375 V
Lead Channel Pacing Threshold Amplitude: 0.75 V
Lead Channel Pacing Threshold Pulse Width: 0.4 ms
Lead Channel Pacing Threshold Pulse Width: 0.4 ms
Lead Channel Setting Pacing Amplitude: 2 V
Lead Channel Setting Pacing Amplitude: 2.5 V
Lead Channel Setting Pacing Pulse Width: 0.4 ms
Lead Channel Setting Sensing Sensitivity: 4 mV

## 2020-02-12 NOTE — Progress Notes (Signed)
PPM Remote  

## 2020-04-22 DIAGNOSIS — Z8673 Personal history of transient ischemic attack (TIA), and cerebral infarction without residual deficits: Secondary | ICD-10-CM | POA: Diagnosis not present

## 2020-04-22 DIAGNOSIS — J9691 Respiratory failure, unspecified with hypoxia: Secondary | ICD-10-CM | POA: Diagnosis not present

## 2020-04-22 DIAGNOSIS — R627 Adult failure to thrive: Secondary | ICD-10-CM | POA: Diagnosis not present

## 2020-04-22 DIAGNOSIS — R1310 Dysphagia, unspecified: Secondary | ICD-10-CM | POA: Diagnosis not present

## 2020-04-22 DIAGNOSIS — G309 Alzheimer's disease, unspecified: Secondary | ICD-10-CM | POA: Diagnosis not present

## 2020-04-22 DIAGNOSIS — Z8709 Personal history of other diseases of the respiratory system: Secondary | ICD-10-CM | POA: Diagnosis not present

## 2020-04-22 DIAGNOSIS — I2510 Atherosclerotic heart disease of native coronary artery without angina pectoris: Secondary | ICD-10-CM | POA: Diagnosis not present

## 2020-04-22 DIAGNOSIS — F0281 Dementia in other diseases classified elsewhere with behavioral disturbance: Secondary | ICD-10-CM | POA: Diagnosis not present

## 2020-04-22 DIAGNOSIS — L89151 Pressure ulcer of sacral region, stage 1: Secondary | ICD-10-CM | POA: Diagnosis not present

## 2020-05-13 ENCOUNTER — Ambulatory Visit (INDEPENDENT_AMBULATORY_CARE_PROVIDER_SITE_OTHER): Payer: Medicare HMO | Admitting: *Deleted

## 2020-05-13 DIAGNOSIS — I442 Atrioventricular block, complete: Secondary | ICD-10-CM

## 2020-05-14 LAB — CUP PACEART REMOTE DEVICE CHECK
Battery Impedance: 1058 Ohm
Battery Remaining Longevity: 51 mo
Battery Voltage: 2.78 V
Brady Statistic AP VP Percent: 7 %
Brady Statistic AP VS Percent: 0 %
Brady Statistic AS VP Percent: 93 %
Brady Statistic AS VS Percent: 0 %
Date Time Interrogation Session: 20210707143933
Implantable Lead Implant Date: 20070316
Implantable Lead Implant Date: 20070316
Implantable Lead Location: 753859
Implantable Lead Location: 753860
Implantable Lead Model: 4469
Implantable Lead Model: 4470
Implantable Lead Serial Number: 478729
Implantable Lead Serial Number: 523845
Implantable Pulse Generator Implant Date: 20150930
Lead Channel Impedance Value: 463 Ohm
Lead Channel Impedance Value: 556 Ohm
Lead Channel Pacing Threshold Amplitude: 0.375 V
Lead Channel Pacing Threshold Amplitude: 0.875 V
Lead Channel Pacing Threshold Pulse Width: 0.4 ms
Lead Channel Pacing Threshold Pulse Width: 0.4 ms
Lead Channel Setting Pacing Amplitude: 2 V
Lead Channel Setting Pacing Amplitude: 2.5 V
Lead Channel Setting Pacing Pulse Width: 0.4 ms
Lead Channel Setting Sensing Sensitivity: 4 mV

## 2020-05-15 NOTE — Progress Notes (Signed)
Remote pacemaker transmission.   

## 2020-06-03 DIAGNOSIS — G4700 Insomnia, unspecified: Secondary | ICD-10-CM | POA: Diagnosis not present

## 2020-06-03 DIAGNOSIS — F419 Anxiety disorder, unspecified: Secondary | ICD-10-CM | POA: Diagnosis not present

## 2020-06-03 DIAGNOSIS — F329 Major depressive disorder, single episode, unspecified: Secondary | ICD-10-CM | POA: Diagnosis not present

## 2020-06-03 DIAGNOSIS — G309 Alzheimer's disease, unspecified: Secondary | ICD-10-CM | POA: Diagnosis not present

## 2020-06-03 DIAGNOSIS — F0391 Unspecified dementia with behavioral disturbance: Secondary | ICD-10-CM | POA: Diagnosis not present

## 2020-06-17 DIAGNOSIS — F0391 Unspecified dementia with behavioral disturbance: Secondary | ICD-10-CM | POA: Diagnosis not present

## 2020-06-17 DIAGNOSIS — G309 Alzheimer's disease, unspecified: Secondary | ICD-10-CM | POA: Diagnosis not present

## 2020-06-19 DIAGNOSIS — R05 Cough: Secondary | ICD-10-CM | POA: Diagnosis not present

## 2020-06-21 DIAGNOSIS — J9691 Respiratory failure, unspecified with hypoxia: Secondary | ICD-10-CM | POA: Diagnosis not present

## 2020-06-21 DIAGNOSIS — L89151 Pressure ulcer of sacral region, stage 1: Secondary | ICD-10-CM | POA: Diagnosis not present

## 2020-06-21 DIAGNOSIS — G309 Alzheimer's disease, unspecified: Secondary | ICD-10-CM | POA: Diagnosis not present

## 2020-06-21 DIAGNOSIS — F0281 Dementia in other diseases classified elsewhere with behavioral disturbance: Secondary | ICD-10-CM | POA: Diagnosis not present

## 2020-06-21 DIAGNOSIS — Z8709 Personal history of other diseases of the respiratory system: Secondary | ICD-10-CM | POA: Diagnosis not present

## 2020-06-21 DIAGNOSIS — R627 Adult failure to thrive: Secondary | ICD-10-CM | POA: Diagnosis not present

## 2020-06-21 DIAGNOSIS — I2510 Atherosclerotic heart disease of native coronary artery without angina pectoris: Secondary | ICD-10-CM | POA: Diagnosis not present

## 2020-06-21 DIAGNOSIS — R1310 Dysphagia, unspecified: Secondary | ICD-10-CM | POA: Diagnosis not present

## 2020-06-21 DIAGNOSIS — Z8673 Personal history of transient ischemic attack (TIA), and cerebral infarction without residual deficits: Secondary | ICD-10-CM | POA: Diagnosis not present

## 2020-07-01 DIAGNOSIS — F0391 Unspecified dementia with behavioral disturbance: Secondary | ICD-10-CM | POA: Diagnosis not present

## 2020-07-01 DIAGNOSIS — F331 Major depressive disorder, recurrent, moderate: Secondary | ICD-10-CM | POA: Diagnosis not present

## 2020-07-01 DIAGNOSIS — G4700 Insomnia, unspecified: Secondary | ICD-10-CM | POA: Diagnosis not present

## 2020-07-01 DIAGNOSIS — F411 Generalized anxiety disorder: Secondary | ICD-10-CM | POA: Diagnosis not present

## 2020-07-01 DIAGNOSIS — G309 Alzheimer's disease, unspecified: Secondary | ICD-10-CM | POA: Diagnosis not present

## 2020-09-02 DIAGNOSIS — G4700 Insomnia, unspecified: Secondary | ICD-10-CM | POA: Diagnosis not present

## 2020-09-02 DIAGNOSIS — F0391 Unspecified dementia with behavioral disturbance: Secondary | ICD-10-CM | POA: Diagnosis not present

## 2020-09-02 DIAGNOSIS — F331 Major depressive disorder, recurrent, moderate: Secondary | ICD-10-CM | POA: Diagnosis not present

## 2020-09-02 DIAGNOSIS — F411 Generalized anxiety disorder: Secondary | ICD-10-CM | POA: Diagnosis not present

## 2020-09-02 DIAGNOSIS — G309 Alzheimer's disease, unspecified: Secondary | ICD-10-CM | POA: Diagnosis not present

## 2020-09-10 ENCOUNTER — Ambulatory Visit (INDEPENDENT_AMBULATORY_CARE_PROVIDER_SITE_OTHER): Payer: Medicare HMO

## 2020-09-10 DIAGNOSIS — I442 Atrioventricular block, complete: Secondary | ICD-10-CM | POA: Diagnosis not present

## 2020-09-11 LAB — CUP PACEART REMOTE DEVICE CHECK
Battery Impedance: 1389 Ohm
Battery Remaining Longevity: 42 mo
Battery Voltage: 2.77 V
Brady Statistic AP VP Percent: 6 %
Brady Statistic AP VS Percent: 0 %
Brady Statistic AS VP Percent: 94 %
Brady Statistic AS VS Percent: 0 %
Date Time Interrogation Session: 20211104163847
Implantable Lead Implant Date: 20070316
Implantable Lead Implant Date: 20070316
Implantable Lead Location: 753859
Implantable Lead Location: 753860
Implantable Lead Model: 4469
Implantable Lead Model: 4470
Implantable Lead Serial Number: 478729
Implantable Lead Serial Number: 523845
Implantable Pulse Generator Implant Date: 20150930
Lead Channel Impedance Value: 456 Ohm
Lead Channel Impedance Value: 552 Ohm
Lead Channel Pacing Threshold Amplitude: 0.375 V
Lead Channel Pacing Threshold Amplitude: 0.75 V
Lead Channel Pacing Threshold Pulse Width: 0.4 ms
Lead Channel Pacing Threshold Pulse Width: 0.4 ms
Lead Channel Setting Pacing Amplitude: 2 V
Lead Channel Setting Pacing Amplitude: 2.5 V
Lead Channel Setting Pacing Pulse Width: 0.4 ms
Lead Channel Setting Sensing Sensitivity: 4 mV

## 2020-09-14 NOTE — Progress Notes (Signed)
Remote pacemaker transmission.   

## 2020-09-29 DIAGNOSIS — H3540 Unspecified peripheral retinal degeneration: Secondary | ICD-10-CM | POA: Diagnosis not present

## 2020-09-29 DIAGNOSIS — H353131 Nonexudative age-related macular degeneration, bilateral, early dry stage: Secondary | ICD-10-CM | POA: Diagnosis not present

## 2020-09-30 DIAGNOSIS — F331 Major depressive disorder, recurrent, moderate: Secondary | ICD-10-CM | POA: Diagnosis not present

## 2020-09-30 DIAGNOSIS — F411 Generalized anxiety disorder: Secondary | ICD-10-CM | POA: Diagnosis not present

## 2020-09-30 DIAGNOSIS — G4700 Insomnia, unspecified: Secondary | ICD-10-CM | POA: Diagnosis not present

## 2020-09-30 DIAGNOSIS — G309 Alzheimer's disease, unspecified: Secondary | ICD-10-CM | POA: Diagnosis not present

## 2020-09-30 DIAGNOSIS — F0391 Unspecified dementia with behavioral disturbance: Secondary | ICD-10-CM | POA: Diagnosis not present

## 2020-10-19 DIAGNOSIS — F331 Major depressive disorder, recurrent, moderate: Secondary | ICD-10-CM | POA: Diagnosis not present

## 2020-10-19 DIAGNOSIS — G4700 Insomnia, unspecified: Secondary | ICD-10-CM | POA: Diagnosis not present

## 2020-10-19 DIAGNOSIS — F0391 Unspecified dementia with behavioral disturbance: Secondary | ICD-10-CM | POA: Diagnosis not present

## 2020-10-19 DIAGNOSIS — F411 Generalized anxiety disorder: Secondary | ICD-10-CM | POA: Diagnosis not present

## 2020-10-19 DIAGNOSIS — G309 Alzheimer's disease, unspecified: Secondary | ICD-10-CM | POA: Diagnosis not present

## 2020-10-27 DIAGNOSIS — M25551 Pain in right hip: Secondary | ICD-10-CM | POA: Diagnosis not present

## 2020-10-27 DIAGNOSIS — M545 Low back pain, unspecified: Secondary | ICD-10-CM | POA: Diagnosis not present

## 2020-10-28 DIAGNOSIS — F0391 Unspecified dementia with behavioral disturbance: Secondary | ICD-10-CM | POA: Diagnosis not present

## 2020-10-28 DIAGNOSIS — G4700 Insomnia, unspecified: Secondary | ICD-10-CM | POA: Diagnosis not present

## 2020-10-28 DIAGNOSIS — G309 Alzheimer's disease, unspecified: Secondary | ICD-10-CM | POA: Diagnosis not present

## 2020-10-28 DIAGNOSIS — F331 Major depressive disorder, recurrent, moderate: Secondary | ICD-10-CM | POA: Diagnosis not present

## 2020-10-28 DIAGNOSIS — F411 Generalized anxiety disorder: Secondary | ICD-10-CM | POA: Diagnosis not present

## 2020-12-02 DIAGNOSIS — F0391 Unspecified dementia with behavioral disturbance: Secondary | ICD-10-CM | POA: Diagnosis not present

## 2020-12-02 DIAGNOSIS — F331 Major depressive disorder, recurrent, moderate: Secondary | ICD-10-CM | POA: Diagnosis not present

## 2020-12-02 DIAGNOSIS — G309 Alzheimer's disease, unspecified: Secondary | ICD-10-CM | POA: Diagnosis not present

## 2020-12-02 DIAGNOSIS — G4700 Insomnia, unspecified: Secondary | ICD-10-CM | POA: Diagnosis not present

## 2020-12-02 DIAGNOSIS — F411 Generalized anxiety disorder: Secondary | ICD-10-CM | POA: Diagnosis not present

## 2020-12-09 DIAGNOSIS — R0989 Other specified symptoms and signs involving the circulatory and respiratory systems: Secondary | ICD-10-CM | POA: Diagnosis not present

## 2020-12-09 LAB — CUP PACEART REMOTE DEVICE CHECK
Battery Impedance: 1561 Ohm
Battery Remaining Longevity: 37 mo
Battery Voltage: 2.76 V
Brady Statistic AP VP Percent: 6 %
Brady Statistic AP VS Percent: 0 %
Brady Statistic AS VP Percent: 94 %
Brady Statistic AS VS Percent: 0 %
Date Time Interrogation Session: 20220202105432
Implantable Lead Implant Date: 20070316
Implantable Lead Implant Date: 20070316
Implantable Lead Location: 753859
Implantable Lead Location: 753860
Implantable Lead Model: 4469
Implantable Lead Model: 4470
Implantable Lead Serial Number: 478729
Implantable Lead Serial Number: 523845
Implantable Pulse Generator Implant Date: 20150930
Lead Channel Impedance Value: 445 Ohm
Lead Channel Impedance Value: 471 Ohm
Lead Channel Pacing Threshold Amplitude: 0.375 V
Lead Channel Pacing Threshold Amplitude: 0.875 V
Lead Channel Pacing Threshold Pulse Width: 0.4 ms
Lead Channel Pacing Threshold Pulse Width: 0.4 ms
Lead Channel Setting Pacing Amplitude: 2 V
Lead Channel Setting Pacing Amplitude: 2.5 V
Lead Channel Setting Pacing Pulse Width: 0.4 ms
Lead Channel Setting Sensing Sensitivity: 4 mV

## 2020-12-10 ENCOUNTER — Ambulatory Visit (INDEPENDENT_AMBULATORY_CARE_PROVIDER_SITE_OTHER)

## 2020-12-10 DIAGNOSIS — I442 Atrioventricular block, complete: Secondary | ICD-10-CM

## 2020-12-11 DIAGNOSIS — R059 Cough, unspecified: Secondary | ICD-10-CM | POA: Diagnosis not present

## 2020-12-16 NOTE — Progress Notes (Signed)
Remote pacemaker transmission.   

## 2021-01-05 DEATH — deceased
